# Patient Record
Sex: Female | Born: 1960
Health system: Southern US, Community
[De-identification: ages and names within clinical notes are randomized; demographics above are authoritative.]

## PROBLEM LIST (undated history)

## (undated) DIAGNOSIS — K219 Gastro-esophageal reflux disease without esophagitis: Secondary | ICD-10-CM

## (undated) DIAGNOSIS — F419 Anxiety disorder, unspecified: Secondary | ICD-10-CM

## (undated) DIAGNOSIS — F32A Depression, unspecified: Secondary | ICD-10-CM

## (undated) DIAGNOSIS — G8929 Other chronic pain: Secondary | ICD-10-CM

## (undated) DIAGNOSIS — T7840XA Allergy, unspecified, initial encounter: Secondary | ICD-10-CM

## (undated) DIAGNOSIS — M549 Dorsalgia, unspecified: Secondary | ICD-10-CM

## (undated) DIAGNOSIS — M1990 Unspecified osteoarthritis, unspecified site: Secondary | ICD-10-CM

## (undated) DIAGNOSIS — E785 Hyperlipidemia, unspecified: Secondary | ICD-10-CM

## (undated) HISTORY — DX: Gastro-esophageal reflux disease without esophagitis: K21.9

## (undated) HISTORY — DX: Anxiety disorder, unspecified: F41.9

## (undated) HISTORY — PX: BRAIN SURGERY: SHX531

## (undated) HISTORY — DX: Hyperlipidemia, unspecified: E78.5

## (undated) HISTORY — PX: CHOLECYSTECTOMY: SHX55

## (undated) HISTORY — DX: Allergy, unspecified, initial encounter: T78.40XA

---

## 2000-04-11 ENCOUNTER — Observation Stay (HOSPITAL_COMMUNITY): Admission: RE | Admit: 2000-04-11 | Discharge: 2000-04-11 | Payer: Self-pay | Admitting: General Surgery

## 2000-11-14 ENCOUNTER — Other Ambulatory Visit: Admission: RE | Admit: 2000-11-14 | Discharge: 2000-11-14 | Payer: Self-pay | Admitting: Family Medicine

## 2000-12-06 ENCOUNTER — Encounter: Payer: Self-pay | Admitting: Family Medicine

## 2000-12-06 ENCOUNTER — Encounter: Admission: RE | Admit: 2000-12-06 | Discharge: 2000-12-06 | Payer: Self-pay | Admitting: Family Medicine

## 2011-01-16 ENCOUNTER — Ambulatory Visit: Payer: Self-pay | Admitting: Physical Therapy

## 2012-06-18 ENCOUNTER — Other Ambulatory Visit: Payer: Self-pay

## 2012-06-23 ENCOUNTER — Telehealth: Payer: Self-pay | Admitting: Nurse Practitioner

## 2012-06-25 ENCOUNTER — Telehealth: Payer: Self-pay | Admitting: *Deleted

## 2012-06-25 MED ORDER — ATORVASTATIN CALCIUM 40 MG PO TABS: 40.0000 mg | ORAL_TABLET | Freq: Every day | ORAL | Status: DC

## 2012-06-25 NOTE — Telephone Encounter (Signed)
RX for lipitor sent to [facility]

## 2012-06-25 NOTE — Telephone Encounter (Signed)
[facility] [employer] Denied coverage of Crestor until pt has tried a generic HMG or HMG combination and had inadequate treatment response or adverse side effects .  Can you please consider trying one of these medications. Thank you.

## 2012-06-25 NOTE — Telephone Encounter (Signed)
After taking 31 days of generic medication crestor can be re submitted and it will be covered without a prior authorization.

## 2012-06-25 NOTE — Telephone Encounter (Signed)
Stacie Solis sent prior auth request on 06-20-12, still pending, will have her recheck on this today. Meanwhile, I gave pt 2 wks of samples.

## 2012-07-03 ENCOUNTER — Other Ambulatory Visit: Payer: Self-pay | Admitting: Nurse Practitioner

## 2012-07-03 NOTE — Telephone Encounter (Signed)
Last seen 01/15/12   On med list patient is not on fenofibrate  On Trilipix 145  Will send chart to you

## 2012-09-05 ENCOUNTER — Other Ambulatory Visit: Payer: Self-pay | Admitting: Family Medicine

## 2012-09-10 ENCOUNTER — Telehealth: Payer: Self-pay | Admitting: Nurse Practitioner

## 2012-09-10 NOTE — Telephone Encounter (Signed)
APPT MADE

## 2012-09-11 ENCOUNTER — Ambulatory Visit (INDEPENDENT_AMBULATORY_CARE_PROVIDER_SITE_OTHER): Payer: 59 | Admitting: Nurse Practitioner

## 2012-09-11 ENCOUNTER — Encounter: Payer: Self-pay | Admitting: Nurse Practitioner

## 2012-09-11 VITALS — BP 137/81 | HR 68 | Temp 97.1°F | Ht 62.0 in | Wt 180.0 lb

## 2012-09-11 DIAGNOSIS — K219 Gastro-esophageal reflux disease without esophagitis: Secondary | ICD-10-CM | POA: Insufficient documentation

## 2012-09-11 DIAGNOSIS — G500 Trigeminal neuralgia: Secondary | ICD-10-CM | POA: Insufficient documentation

## 2012-09-11 DIAGNOSIS — M5430 Sciatica, unspecified side: Secondary | ICD-10-CM

## 2012-09-11 DIAGNOSIS — E785 Hyperlipidemia, unspecified: Secondary | ICD-10-CM | POA: Insufficient documentation

## 2012-09-11 DIAGNOSIS — M5431 Sciatica, right side: Secondary | ICD-10-CM

## 2012-09-11 DIAGNOSIS — F410 Panic disorder [episodic paroxysmal anxiety] without agoraphobia: Secondary | ICD-10-CM | POA: Insufficient documentation

## 2012-09-11 MED ORDER — METHYLPREDNISOLONE ACETATE 80 MG/ML IJ SUSP
80.0000 mg | Freq: Once | INTRAMUSCULAR | Status: AC
  Administered 2012-09-11: 80 mg via INTRAMUSCULAR

## 2012-09-11 MED ORDER — TRAMADOL HCL 50 MG PO TABS: 50.0000 mg | ORAL_TABLET | Freq: Three times a day (TID) | ORAL | Status: DC | PRN

## 2012-09-11 NOTE — Patient Instructions (Addendum)
Sciatica  Sciatica is a condition often seen in patients with disk disease of the lower back. Pressure on the sciatica nerve causes pain to radiate from the lower back or buttock down the leg.   CAUSES   It results from pressure on nerve roots coming out of the spine. This is often the result of a disc that deteriorates and pushes to one side. Often there is a history of back problems.   TREATMENT   In most cases sciatica improves greatly with conservative treatment (treatment that does not involve surgery). Most patients are completely better after 2-4 weeks of bed rest and other supportive care. Bed rest reduces the disc pressure greatly. Sitting is the worst position. When sitting pressure on the disc is over 5 times greater than it is while lying down. Avoid:   Bending.   Lifting.   All other activities which make the problem worse.  After the pain improves, you may continue with normal activity. Take brief periods for bed rest throughout the day until you are back to normal.  Only take over-the-counter or prescription medicines for pain, discomfort, or fever as directed by your caregiver. Muscle relaxants may help by relieving spasm and providing mild sedation. Cold or heat therapy and massage may also give significant relief. Spinal manipulation is not recommended because it can increase the degree of disc protrusion. Traction can be used in severe cases. Surgery is reserved for patients who:   Do not improve within the first months of conservative treatment.   Have signs of severe nerve root pressure.  See your doctor for follow up care as recommended. A program for back injury rehabilitation with stretching and strengthening exercises is an important part of healing.   SEEK MEDICAL CARE IF:    You notice increased pain.   You notice weakness.   You have numbness in your legs.   You have any difficulty with bladder or bowel control.  Document Released: 03/22/2004 Document Revised: 02/01/2011 Document  Reviewed: 02/12/2005  Exit[employer] Patient Information 2012 [employer].

## 2012-09-11 NOTE — Progress Notes (Signed)
  Subjective:    Patient ID: Stacie Solis, female    DOB: 02/22/1961, 52 y.o.   MRN: [id number]  HPI Pt here for for lower back pain that radiates into her legs. States this has been going on for about a year, but last month has gotten much worse. Pain is intermittent and sharp aching 9 out 10 when the pain occurs. Sitting and driving makes it worse, and laying down helps pain. Pt states she has taken Motrin and Tylenol with mild relief.     Review of Systems  Musculoskeletal: Positive for back pain.  All other systems reviewed and are negative.       Objective:   Physical Exam  Constitutional: She is oriented to person, place, and time. She appears well-developed and well-nourished.  Cardiovascular: Normal rate, regular rhythm, normal heart sounds and intact distal pulses.   Pulmonary/Chest: Effort normal.  Musculoskeletal: Normal range of motion. She exhibits tenderness.  Pain to touch in lower left gluteal area radiating down right leg at least down to knee  Neurological: She is alert and oriented to person, place, and time.  Skin: Skin is warm and dry.  Psychiatric: She has a normal mood and affect. Her behavior is normal. Judgment and thought content normal.      BP 137/81  Pulse 68  Temp(Src) 97.1 F (36.2 C) (Oral)  Ht 5\' 2"  (1.575 m)  Wt 180 lb (81.647 kg)  BMI 32.91 kg/m2     Assessment & Plan:  1. Sciatica neuralgia, right Moist heat to buttocks Rest No bending or stooping RTO prn - methylPREDNISolone acetate (DEPO-MEDROL) injection 80 mg; Inject 1 mL (80 mg total) into the muscle once. - traMADol (ULTRAM) 50 MG tablet; Take 1 tablet (50 mg total) by mouth every 8 (eight) hours as needed for pain.  Dispense: 30 tablet; Refill: 0  Mary-Margaret Daphine Deutscher, FNP

## 2012-09-17 ENCOUNTER — Other Ambulatory Visit: Payer: Self-pay | Admitting: Nurse Practitioner

## 2012-09-23 ENCOUNTER — Telehealth: Payer: Self-pay | Admitting: Nurse Practitioner

## 2012-09-24 ENCOUNTER — Telehealth: Payer: Self-pay | Admitting: Nurse Practitioner

## 2012-09-24 NOTE — Telephone Encounter (Signed)
NTBS.

## 2012-09-25 ENCOUNTER — Telehealth: Payer: Self-pay | Admitting: Nurse Practitioner

## 2012-09-25 MED ORDER — PREDNISONE (PAK) 10 MG PO TABS: ORAL_TABLET | ORAL | Status: DC

## 2012-09-25 NOTE — Telephone Encounter (Signed)
Rx for medrol dose pak sent to pharmacy. Pt notified

## 2012-09-25 NOTE — Telephone Encounter (Signed)
NTBS.

## 2012-09-25 NOTE — Telephone Encounter (Signed)
Husband notified that rx sent to pharmacy

## 2012-09-25 NOTE — Telephone Encounter (Signed)
Here 7/17 and gave a shot of cortisone- back and hip dont hurt. Only right leg pain. Has tramadol on hand- not helping. Is there something different or stronger that [facility]

## 2012-09-25 NOTE — Telephone Encounter (Signed)
prenisone dose pack rx sent to pharmacy

## 2012-10-05 ENCOUNTER — Other Ambulatory Visit: Payer: Self-pay | Admitting: Nurse Practitioner

## 2012-12-17 ENCOUNTER — Other Ambulatory Visit: Payer: Self-pay | Admitting: Nurse Practitioner

## 2012-12-18 NOTE — Telephone Encounter (Signed)
No refill on lipitor- NTBS 

## 2012-12-18 NOTE — Telephone Encounter (Signed)
No labs or appts since 11/13

## 2012-12-22 ENCOUNTER — Other Ambulatory Visit: Payer: Self-pay | Admitting: Nurse Practitioner

## 2012-12-24 ENCOUNTER — Telehealth: Payer: Self-pay | Admitting: Nurse Practitioner

## 2012-12-24 NOTE — Telephone Encounter (Signed)
LAST OV 09/11/12.

## 2012-12-24 NOTE — Telephone Encounter (Signed)
Appt given per pt request 

## 2013-01-03 ENCOUNTER — Other Ambulatory Visit: Payer: Self-pay | Admitting: Nurse Practitioner

## 2013-01-05 ENCOUNTER — Ambulatory Visit (INDEPENDENT_AMBULATORY_CARE_PROVIDER_SITE_OTHER): Payer: 59 | Admitting: Nurse Practitioner

## 2013-01-05 ENCOUNTER — Encounter: Payer: Self-pay | Admitting: Nurse Practitioner

## 2013-01-05 VITALS — BP 129/80 | HR 73 | Temp 97.9°F | Ht 62.0 in | Wt 185.0 lb

## 2013-01-05 DIAGNOSIS — K219 Gastro-esophageal reflux disease without esophagitis: Secondary | ICD-10-CM

## 2013-01-05 DIAGNOSIS — F32A Depression, unspecified: Secondary | ICD-10-CM | POA: Insufficient documentation

## 2013-01-05 DIAGNOSIS — F410 Panic disorder [episodic paroxysmal anxiety] without agoraphobia: Secondary | ICD-10-CM

## 2013-01-05 DIAGNOSIS — F329 Major depressive disorder, single episode, unspecified: Secondary | ICD-10-CM | POA: Insufficient documentation

## 2013-01-05 DIAGNOSIS — E785 Hyperlipidemia, unspecified: Secondary | ICD-10-CM

## 2013-01-05 MED ORDER — FENOFIBRIC ACID 135 MG PO CPDR: 1.0000 | DELAYED_RELEASE_CAPSULE | Freq: Every day | ORAL | Status: DC

## 2013-01-05 MED ORDER — ATORVASTATIN CALCIUM 40 MG PO TABS: 40.0000 mg | ORAL_TABLET | Freq: Every day | ORAL | Status: DC

## 2013-01-05 MED ORDER — OMEPRAZOLE 40 MG PO CPDR: 40.0000 mg | DELAYED_RELEASE_CAPSULE | Freq: Every day | ORAL | Status: DC

## 2013-01-05 MED ORDER — ESCITALOPRAM OXALATE 10 MG PO TABS: 10.0000 mg | ORAL_TABLET | Freq: Every day | ORAL | Status: DC

## 2013-01-05 MED ORDER — TRAZODONE HCL 50 MG PO TABS: 25.0000 mg | ORAL_TABLET | Freq: Every evening | ORAL | Status: DC | PRN

## 2013-01-05 NOTE — Progress Notes (Signed)
Subjective:    Patient ID: Stacie Solis, female    DOB: [birth year], 52 y.o.   MRN: [id number]  Patien tin today  for chronic medical problems: Hyperlipidemia This is a chronic problem. The current episode started more than 1 year ago. The problem is uncontrolled. Recent lipid tests were reviewed and are high. Exacerbating diseases include obesity. She has no history of diabetes, hypothyroidism or liver disease. There are no known factors aggravating her hyperlipidemia. Pertinent negatives include no chest pain, focal sensory loss, focal weakness, leg pain or myalgias. Current antihyperlipidemic treatment includes statins and fibric acid derivatives. The current treatment provides mild improvement of lipids. Compliance problems include adherence to diet and adherence to exercise.   Depression/insomnia Works well for her depression and anxiety- but doesn't help with sleep- says that body won't wind down to rest. GERD Omeprazole is working well for symptoms.  Review of Systems  Cardiovascular: Negative for chest pain.  Musculoskeletal: Negative for myalgias.  Neurological: Negative for focal weakness.  All other systems reviewed and are negative.       Objective:   Physical Exam  Constitutional: She is oriented to person, place, and time. She appears well-developed and well-nourished.  HENT:  Nose: Nose normal.  Mouth/Throat: Oropharynx is clear and moist.  Eyes: EOM are normal.  Neck: Trachea normal, normal range of motion and full passive range of motion without pain. Neck supple. No JVD present. Carotid bruit is not present. No thyromegaly present.  Cardiovascular: Normal rate, regular rhythm, normal heart sounds and intact distal pulses.  Exam reveals no gallop and no friction rub.   No murmur heard. Pulmonary/Chest: Effort normal and breath sounds normal.  Abdominal: Soft. Bowel sounds are normal. She exhibits no distension and no mass. There is no tenderness.  Musculoskeletal:  Normal range of motion.  Lymphadenopathy:    She has no cervical adenopathy.  Neurological: She is alert and oriented to person, place, and time. She has normal reflexes.  Skin: Skin is warm and dry.  Psychiatric: She has a normal mood and affect. Her behavior is normal. Judgment and thought content normal.    BP 129/80  Pulse 73  Temp(Src) 97.9 F (36.6 C) (Oral)  Ht 5\' 2"  (1.575 m)  Wt 185 lb (83.915 kg)  BMI 33.83 kg/m2       Assessment & Plan:   1. Depression   2. Panic attacks   3. Hyperlipidemia   4. GERD (gastroesophageal reflux disease)    Orders Placed This Encounter  Procedures  . CMP14+EGFR  . NMR, lipoprofile   Meds ordered this encounter  Medications  . triamcinolone (NASACORT) 55 MCG/ACT nasal inhaler    Sig:   . DISCONTD: HYDROcodone-acetaminophen (NORCO/VICODIN) 5-325 MG per tablet    Sig:   . traZODone (DESYREL) 50 MG tablet    Sig: Take 0.5-1 tablets (25-50 mg total) by mouth at bedtime as needed for sleep.    Dispense:  30 tablet    Refill:  3    Order Specific Question:  Supervising Provider    Answer:  Ernestina Penna [[id number]]  . omeprazole (PRILOSEC) 40 MG capsule    Sig: Take 1 capsule (40 mg total) by mouth daily.    Dispense:  90 capsule    Refill:  1    Order Specific Question:  Supervising Provider    Answer:  Ernestina Penna [[id number]]  . escitalopram (LEXAPRO) 10 MG tablet    Sig: Take 1 tablet (10 mg total) by mouth  daily.    Dispense:  90 tablet    Refill:  1    Order Specific Question:  Supervising Provider    Answer:  Ernestina Penna [[id number]]  . Choline Fenofibrate (FENOFIBRIC ACID) 135 MG CPDR    Sig: Take 1 tablet by mouth daily.    Dispense:  90 capsule    Refill:  1    Order Specific Question:  Supervising Provider    Answer:  Ernestina Penna [[id number]]  . atorvastatin (LIPITOR) 40 MG tablet    Sig: Take 1 tablet (40 mg total) by mouth daily.    Dispense:  90 tablet    Refill:  1    Order Specific Question:  Supervising  Provider    Answer:  Ernestina Penna [[id number]]  ADDed trazadone for sleep Patient to schedule Continue all meds Labs pending Diet and exercise encouraged Health maintenance reviewed Follow up in 3 months  Mary-Margaret Daphine Deutscher, FNP

## 2013-01-05 NOTE — Patient Instructions (Signed)
Fat and Cholesterol Control Diet  Fat and cholesterol levels in your blood and organs are influenced by your diet. High levels of fat and cholesterol may lead to diseases of the heart, small and large blood vessels, gallbladder, liver, and pancreas.  CONTROLLING FAT AND CHOLESTEROL WITH DIET  Although exercise and lifestyle factors are important, your diet is key. That is because certain foods are known to raise cholesterol and others to lower it. The goal is to balance foods for their effect on cholesterol and more importantly, to replace saturated and trans fat with other types of fat, such as monounsaturated fat, polyunsaturated fat, and omega-3 fatty acids.  On average, a person should consume no more than 15 to 17 g of saturated fat daily. Saturated and trans fats are considered "bad" fats, and they will raise LDL cholesterol. Saturated fats are primarily found in animal products such as meats, butter, and cream. However, that does not mean you need to give up all your favorite foods. Today, there are good tasting, low-fat, low-cholesterol substitutes for most of the things you like to eat. Choose low-fat or nonfat alternatives. Choose round or loin cuts of red meat. These types of cuts are lowest in fat and cholesterol. Chicken (without the skin), fish, veal, and ground turkey breast are great choices. Eliminate fatty meats, such as hot dogs and salami. Even shellfish have little or no saturated fat. Have a 3 oz (85 g) portion when you eat lean meat, poultry, or fish.  Trans fats are also called "partially hydrogenated oils." They are oils that have been scientifically manipulated so that they are solid at room temperature resulting in a longer shelf life and improved taste and texture of foods in which they are added. Trans fats are found in stick margarine, some tub margarines, cookies, crackers, and baked goods.   When baking and cooking, oils are a great substitute for butter. The monounsaturated oils are  especially beneficial since it is believed they lower LDL and raise HDL. The oils you should avoid entirely are saturated tropical oils, such as coconut and palm.   Remember to eat a lot from food groups that are naturally free of saturated and trans fat, including fish, fruit, vegetables, beans, grains (barley, rice, couscous, bulgur wheat), and pasta (without cream sauces).   IDENTIFYING FOODS THAT LOWER FAT AND CHOLESTEROL   Soluble fiber may lower your cholesterol. This type of fiber is found in fruits such as apples, vegetables such as broccoli, potatoes, and carrots, legumes such as beans, peas, and lentils, and grains such as barley. Foods fortified with plant sterols (phytosterol) may also lower cholesterol. You should eat at least 2 g per day of these foods for a cholesterol lowering effect.   Read package labels to identify low-saturated fats, trans fat free, and low-fat foods at the supermarket. Select cheeses that have only 2 to 3 g saturated fat per ounce. Use a heart-healthy tub margarine that is free of trans fats or partially hydrogenated oil. When buying baked goods (cookies, crackers), avoid partially hydrogenated oils. Breads and muffins should be made from whole grains (whole-wheat or whole oat flour, instead of "flour" or "enriched flour"). Buy non-creamy canned soups with reduced salt and no added fats.   FOOD PREPARATION TECHNIQUES   Never deep-fry. If you must fry, either stir-fry, which uses very little fat, or use non-stick cooking sprays. When possible, broil, bake, or roast meats, and steam vegetables. Instead of putting butter or margarine on vegetables, use lemon   and herbs, applesauce, and cinnamon (for squash and sweet potatoes). Use nonfat yogurt, salsa, and low-fat dressings for salads.   LOW-SATURATED FAT / LOW-FAT FOOD SUBSTITUTES  Meats / Saturated Fat (g)  · Avoid: Steak, marbled (3 oz/85 g) / 11 g  · Choose: Steak, lean (3 oz/85 g) / 4 g  · Avoid: Hamburger (3 oz/85 g) / 7  g  · Choose: Hamburger, lean (3 oz/85 g) / 5 g  · Avoid: Ham (3 oz/85 g) / 6 g  · Choose: Ham, lean cut (3 oz/85 g) / 2.4 g  · Avoid: Chicken, with skin, dark meat (3 oz/85 g) / 4 g  · Choose: Chicken, skin removed, dark meat (3 oz/85 g) / 2 g  · Avoid: Chicken, with skin, light meat (3 oz/85 g) / 2.5 g  · Choose: Chicken, skin removed, light meat (3 oz/85 g) / 1 g  Dairy / Saturated Fat (g)  · Avoid: Whole milk (1 cup) / 5 g  · Choose: Low-fat milk, 2% (1 cup) / 3 g  · Choose: Low-fat milk, 1% (1 cup) / 1.5 g  · Choose: Skim milk (1 cup) / 0.3 g  · Avoid: Hard cheese (1 oz/28 g) / 6 g  · Choose: Skim milk cheese (1 oz/28 g) / 2 to 3 g  · Avoid: Cottage cheese, 4% fat (1 cup) / 6.5 g  · Choose: Low-fat cottage cheese, 1% fat (1 cup) / 1.5 g  · Avoid: Ice cream (1 cup) / 9 g  · Choose: Sherbet (1 cup) / 2.5 g  · Choose: Nonfat frozen yogurt (1 cup) / 0.3 g  · Choose: Frozen fruit bar / trace  · Avoid: Whipped cream (1 tbs) / 3.5 g  · Choose: Nondairy whipped topping (1 tbs) / 1 g  Condiments / Saturated Fat (g)  · Avoid: Mayonnaise (1 tbs) / 2 g  · Choose: Low-fat mayonnaise (1 tbs) / 1 g  · Avoid: Butter (1 tbs) / 7 g  · Choose: Extra light margarine (1 tbs) / 1 g  · Avoid: Coconut oil (1 tbs) / 11.8 g  · Choose: Olive oil (1 tbs) / 1.8 g  · Choose: Corn oil (1 tbs) / 1.7 g  · Choose: Safflower oil (1 tbs) / 1.2 g  · Choose: Sunflower oil (1 tbs) / 1.4 g  · Choose: Soybean oil (1 tbs) / 2.4 g  · Choose: Canola oil (1 tbs) / 1 g  Document Released: 02/12/2005 Document Revised: 06/09/2012 Document Reviewed: 08/03/2010  [employer]® Patient Information ©2014 [employer].

## 2013-01-06 LAB — LIPID PANEL
Chol/HDL Ratio: 3.7 ratio units (ref 0.0–4.4)
Cholesterol, Total: 108 mg/dL (ref 100–199)
HDL: 29 mg/dL — ABNORMAL LOW (ref >39)
VLDL Cholesterol Cal: 26 mg/dL (ref 5–40)

## 2013-01-06 LAB — CMP14+EGFR
ALT: 32 IU/L (ref 0–32)
AST: 26 IU/L (ref 0–40)
Albumin/Globulin Ratio: 2.3 (ref 1.1–2.5)
Albumin: 4.3 g/dL (ref 3.5–5.5)
BUN/Creatinine Ratio: 12 (ref 9–23)
BUN: 11 mg/dL (ref 6–24)
Calcium: 9.2 mg/dL (ref 8.7–10.2)
Chloride: 103 mmol/L (ref 97–108)
GFR calc non Af Amer: 69 mL/min/{1.73_m2} (ref >59)
Globulin, Total: 1.9 g/dL (ref 1.5–4.5)
Glucose: 105 mg/dL — ABNORMAL HIGH (ref 65–99)
Potassium: 4.5 mmol/L (ref 3.5–5.2)
Sodium: 141 mmol/L (ref 134–144)
Total Bilirubin: 0.3 mg/dL (ref 0.0–1.2)
Total Protein: 6.2 g/dL (ref 6.0–8.5)

## 2013-01-13 ENCOUNTER — Encounter: Payer: Self-pay | Admitting: *Deleted

## 2013-01-13 NOTE — Progress Notes (Signed)
Quick Note:  Copy of labs sent to patient ______ 

## 2013-03-02 ENCOUNTER — Other Ambulatory Visit: Payer: Self-pay | Admitting: Family Medicine

## 2013-03-24 ENCOUNTER — Other Ambulatory Visit: Payer: Self-pay

## 2013-03-24 MED ORDER — TRIAMCINOLONE ACETONIDE 55 MCG/ACT NA AERO: 2.0000 | INHALATION_SPRAY | Freq: Every day | NASAL | Status: DC

## 2013-03-27 ENCOUNTER — Other Ambulatory Visit: Payer: Self-pay | Admitting: *Deleted

## 2013-03-27 MED ORDER — TRAZODONE HCL 50 MG PO TABS: 25.0000 mg | ORAL_TABLET | Freq: Every evening | ORAL | Status: DC | PRN

## 2013-03-27 NOTE — Telephone Encounter (Signed)
Patient requesting a 90 day supply. Please advise.

## 2013-03-30 ENCOUNTER — Other Ambulatory Visit: Payer: Self-pay | Admitting: Nurse Practitioner

## 2013-03-31 NOTE — Telephone Encounter (Signed)
Last seen 01/05/13 MMM 

## 2013-04-08 ENCOUNTER — Ambulatory Visit: Payer: 59 | Admitting: Nurse Practitioner

## 2013-07-03 ENCOUNTER — Other Ambulatory Visit: Payer: Self-pay

## 2013-07-03 MED ORDER — ATORVASTATIN CALCIUM 40 MG PO TABS: 40.0000 mg | ORAL_TABLET | Freq: Every day | ORAL | Status: DC

## 2013-07-03 MED ORDER — FENOFIBRIC ACID 135 MG PO CPDR: 1.0000 | DELAYED_RELEASE_CAPSULE | Freq: Every day | ORAL | Status: DC

## 2013-07-03 NOTE — Telephone Encounter (Signed)
Last seen 01/05/13 MMM  Last lipid 01/05/13  Requesting 90 day supply

## 2013-07-03 NOTE — Telephone Encounter (Signed)
Patient NTBS for follow up and lab work  

## 2013-08-27 ENCOUNTER — Other Ambulatory Visit: Payer: Self-pay | Admitting: Nurse Practitioner

## 2013-08-31 NOTE — Telephone Encounter (Signed)
Last seen 01/05/13 MMM

## 2013-09-30 ENCOUNTER — Other Ambulatory Visit: Payer: Self-pay | Admitting: Nurse Practitioner

## 2013-10-01 NOTE — Telephone Encounter (Signed)
no more refills without being seen  

## 2013-10-01 NOTE — Telephone Encounter (Signed)
Last seen 12/2012 

## 2013-10-02 ENCOUNTER — Other Ambulatory Visit: Payer: Self-pay | Admitting: Nurse Practitioner

## 2013-10-19 ENCOUNTER — Ambulatory Visit (INDEPENDENT_AMBULATORY_CARE_PROVIDER_SITE_OTHER): Payer: 59 | Admitting: Family Medicine

## 2013-10-19 ENCOUNTER — Encounter: Payer: Self-pay | Admitting: Family Medicine

## 2013-10-19 VITALS — BP 156/79 | HR 66 | Temp 98.7°F | Ht 62.0 in | Wt 192.0 lb

## 2013-10-19 DIAGNOSIS — J0100 Acute maxillary sinusitis, unspecified: Secondary | ICD-10-CM

## 2013-10-19 MED ORDER — FLUCONAZOLE 150 MG PO TABS: 150.0000 mg | ORAL_TABLET | Freq: Once | ORAL | Status: DC

## 2013-10-19 MED ORDER — IPRATROPIUM BROMIDE 0.06 % NA SOLN: 2.0000 | Freq: Four times a day (QID) | NASAL | Status: DC

## 2013-10-19 MED ORDER — DICLOFENAC SODIUM 50 MG PO TBEC: 50.0000 mg | DELAYED_RELEASE_TABLET | Freq: Two times a day (BID) | ORAL | Status: DC

## 2013-10-19 MED ORDER — AMOXICILLIN-POT CLAVULANATE 875-125 MG PO TABS: 1.0000 | ORAL_TABLET | Freq: Two times a day (BID) | ORAL | Status: DC

## 2013-10-19 NOTE — Patient Instructions (Signed)
You have a sinus infection Please start the augmentin Please use the diflucan if you get a yeast infection Use the voltaren for pain and headache Please try the atrovent nasal spray fo rrelief of the drainage down the back of the throat  Sinusitis Sinusitis is redness, soreness, and inflammation of the paranasal sinuses. Paranasal sinuses are air pockets within the bones of your face (beneath the eyes, the middle of the forehead, or above the eyes). In healthy paranasal sinuses, mucus is able to drain out, and air is able to circulate through them by way of your nose. However, when your paranasal sinuses are inflamed, mucus and air can become trapped. This can allow bacteria and other germs to grow and cause infection. Sinusitis can develop quickly and last only a short time (acute) or continue over a long period (chronic). Sinusitis that lasts for more than 12 weeks is considered chronic.  CAUSES  Causes of sinusitis include:  Allergies.  Structural abnormalities, such as displacement of the cartilage that separates your nostrils (deviated septum), which can decrease the air flow through your nose and sinuses and affect sinus drainage.  Functional abnormalities, such as when the small hairs (cilia) that line your sinuses and help remove mucus do not work properly or are not present. SIGNS AND SYMPTOMS  Symptoms of acute and chronic sinusitis are the same. The primary symptoms are pain and pressure around the affected sinuses. Other symptoms include:  Upper toothache.  Earache.  Headache.  Bad breath.  Decreased sense of smell and taste.  A cough, which worsens when you are lying flat.  Fatigue.  Fever.  Thick drainage from your nose, which often is green and may contain pus (purulent).  Swelling and warmth over the affected sinuses. DIAGNOSIS  Your health care provider will perform a physical exam. During the exam, your health care provider may:  Look in your nose for signs  of abnormal growths in your nostrils (nasal polyps).  Tap over the affected sinus to check for signs of infection.  View the inside of your sinuses (endoscopy) using an imaging device that has a light attached (endoscope). If your health care provider suspects that you have chronic sinusitis, one or more of the following tests may be recommended:  Allergy tests.  Nasal culture. A sample of mucus is taken from your nose, sent to a lab, and screened for bacteria.  Nasal cytology. A sample of mucus is taken from your nose and examined by your health care provider to determine if your sinusitis is related to an allergy. TREATMENT  Most cases of acute sinusitis are related to a viral infection and will resolve on their own within 10 days. Sometimes medicines are prescribed to help relieve symptoms (pain medicine, decongestants, nasal steroid sprays, or saline sprays).  However, for sinusitis related to a bacterial infection, your health care provider will prescribe antibiotic medicines. These are medicines that will help kill the bacteria causing the infection.  Rarely, sinusitis is caused by a fungal infection. In theses cases, your health care provider will prescribe antifungal medicine. For some cases of chronic sinusitis, surgery is needed. Generally, these are cases in which sinusitis recurs more than 3 times per year, despite other treatments. HOME CARE INSTRUCTIONS   Drink plenty of water. Water helps thin the mucus so your sinuses can drain more easily.  Use a humidifier.  Inhale steam 3 to 4 times a day (for example, sit in the bathroom with the shower running).  Apply a  warm, moist washcloth to your face 3 to 4 times a day, or as directed by your health care provider.  Use saline nasal sprays to help moisten and clean your sinuses.  Take medicines only as directed by your health care provider.  If you were prescribed either an antibiotic or antifungal medicine, finish it all even  if you start to feel better. SEEK IMMEDIATE MEDICAL CARE IF:  You have increasing pain or severe headaches.  You have nausea, vomiting, or drowsiness.  You have swelling around your face.  You have vision problems.  You have a stiff neck.  You have difficulty breathing. MAKE SURE YOU:   Understand these instructions.  Will watch your condition.  Will get help right away if you are not doing well or get worse. Document Released: 02/12/2005 Document Revised: 06/29/2013 Document Reviewed: 02/27/2011 [facility] Patient Information 2015 [place], [place]. This information is not intended to replace advice given to you by your health care provider. Make sure you discuss any questions you have with your health care provider.

## 2013-10-19 NOTE — Progress Notes (Signed)
Stacie Solis is a 53 y.o. female who presents to [facility] today for sinus infection  Started w/ sinus congestion 3 wks ago. Now w/ ear pressure on R. Lost voice. Denies SOB, rash. Associated w/ HA and fever and chills, post nasal drip. Continues to smoke 2ppd. Sudafed, and other OTC sinus meds w/o much benefit. Nasocort nightly.   Ibuprofen 1000mg  w/ some benefit.   The following portions of the patient's history were reviewed and updated as appropriate: allergies, current medications, past medical history, family and social history, and problem list.  Patient is a smoker   Past Medical History  Diagnosis Date  . GERD (gastroesophageal reflux disease)   . Anxiety   . Allergy   . Hyperlipidemia     ROS as above otherwise neg.    Medications reviewed. Current Outpatient Prescriptions  Medication Sig Dispense Refill  . atorvastatin (LIPITOR) 40 MG tablet TAKE 1 TABLET (40 MG TOTAL) BY MOUTH DAILY.  90 tablet  0  . cholecalciferol (VITAMIN D) 1000 UNITS tablet Take 1,000 Units by mouth daily.      . Choline Fenofibrate (FENOFIBRIC ACID) 135 MG CPDR TAKE ONE CAPSULE BY MOUTH EVERY DAY  90 capsule  0  . escitalopram (LEXAPRO) 10 MG tablet TAKE 1 TABLET (10 MG TOTAL) BY MOUTH DAILY.  90 tablet  1  . fish oil-omega-3 fatty acids 1000 MG capsule Take 2 g by mouth 2 (two) times daily.      Marland Kitchen omeprazole (PRILOSEC) 40 MG capsule TAKE 1 CAPSULE (40 MG TOTAL) BY MOUTH DAILY.  90 capsule  0  . traZODone (DESYREL) 50 MG tablet TAKE 0.5-1 TABLETS (25-50 MG TOTAL) BY MOUTH AT BEDTIME AS NEEDED FOR SLEEP.  90 tablet  0  . triamcinolone (NASACORT) 55 MCG/ACT AERO nasal inhaler Place 2 sprays into the nose daily.  3 Bottle  0  . amoxicillin-clavulanate (AUGMENTIN) 875-125 MG per tablet Take 1 tablet by mouth 2 (two) times daily.  20 tablet  0  . diclofenac (VOLTAREN) 50 MG EC tablet Take 1 tablet (50 mg total) by mouth 2 (two) times daily.  20 tablet  0  . fluconazole (DIFLUCAN) 150 MG tablet Take 1 tablet  (150 mg total) by mouth once. Repeat dose in 2 days  2 tablet  0  . ipratropium (ATROVENT) 0.06 % nasal spray Place 2 sprays into both nostrils 4 (four) times daily.  15 mL  12   No current facility-administered medications for this visit.    Exam:  BP 156/79  Pulse 66  Temp(Src) 98.7 F (37.1 C) (Oral)  Ht 5\' 2"  (1.575 m)  Wt 192 lb (87.091 kg)  BMI 35.11 kg/m2 Gen: Well NAD HEENT: EOMI,  MMM, pharyngeal injection, frontal and maxillary sinuses ttp.  Lungs: CTABL Nl WOB Heart: RRR no MRG Abd: NABS, NT, ND    No results found for this or any previous visit (from the past 72 hour(s)).  A/P (as seen in Problem list)  Sinusitis 3 wks of progressive sinusitis Start augmentin Nasal atrovent for post nasal drip relief.  Counseled to quit smoking Voltaren prn pain and HA Diflucan (pt endorses regular yeast infections post ABX)

## 2013-10-19 NOTE — Assessment & Plan Note (Signed)
3 wks of progressive sinusitis Start augmentin Nasal atrovent for post nasal drip relief.  Counseled to quit smoking Voltaren prn pain and HA Diflucan (pt endorses regular yeast infections post ABX)

## 2013-11-17 ENCOUNTER — Telehealth: Payer: Self-pay

## 2013-11-17 NOTE — Telephone Encounter (Signed)
Pt scheduled for 6 month f/u mammo; Pt aware of appointment date/time  11/24/13 at 10:30am

## 2013-11-17 NOTE — Telephone Encounter (Signed)
Message copied by Koren Bound on Tue Nov 17, 2013  8:49 AM ------      Message from: Hinda Lenis      Created: Fri May 22, 2013  8:12 AM       Needs 6 month f/u rt diagnostic mammo @ [facility] ------

## 2013-11-23 ENCOUNTER — Other Ambulatory Visit: Payer: Self-pay | Admitting: Nurse Practitioner

## 2013-12-09 ENCOUNTER — Ambulatory Visit (INDEPENDENT_AMBULATORY_CARE_PROVIDER_SITE_OTHER): Payer: 59 | Admitting: Family

## 2013-12-09 ENCOUNTER — Encounter: Payer: Self-pay | Admitting: Family

## 2013-12-09 ENCOUNTER — Telehealth: Payer: Self-pay | Admitting: Nurse Practitioner

## 2013-12-09 VITALS — BP 114/81 | HR 78 | Temp 99.2°F | Ht 62.0 in | Wt 191.8 lb

## 2013-12-09 DIAGNOSIS — M545 Low back pain: Secondary | ICD-10-CM

## 2013-12-09 DIAGNOSIS — M5441 Lumbago with sciatica, right side: Secondary | ICD-10-CM

## 2013-12-09 LAB — POCT URINALYSIS DIPSTICK
BILIRUBIN UA: NEGATIVE
Glucose, UA: NEGATIVE
KETONES UA: NEGATIVE
LEUKOCYTES UA: NEGATIVE
Nitrite, UA: NEGATIVE
Protein, UA: NEGATIVE
Spec Grav, UA: <=1.005
Urobilinogen, UA: NEGATIVE
pH, UA: 7.5

## 2013-12-09 LAB — POCT UA - MICROSCOPIC ONLY
Casts, Ur, LPF, POC: NEGATIVE
Crystals, Ur, HPF, POC: NEGATIVE
MUCUS UA: NEGATIVE
Yeast, UA: NEGATIVE

## 2013-12-09 MED ORDER — CYCLOBENZAPRINE HCL 10 MG PO TABS: 10.0000 mg | ORAL_TABLET | Freq: Three times a day (TID) | ORAL | Status: DC | PRN

## 2013-12-09 MED ORDER — MELOXICAM 15 MG PO TABS: 15.0000 mg | ORAL_TABLET | Freq: Every day | ORAL | Status: DC

## 2013-12-09 MED ORDER — KETOROLAC TROMETHAMINE 60 MG/2ML IM SOLN
60.0000 mg | Freq: Once | INTRAMUSCULAR | Status: AC
  Administered 2013-12-09: 60 mg via INTRAMUSCULAR

## 2013-12-09 NOTE — Progress Notes (Signed)
Subjective:    Patient ID: Stacie Solis, female    DOB: Oct 20, 1960, 53 y.o.   MRN: [id number]  Back Pain This is a new problem. The current episode started yesterday. The problem occurs intermittently. The problem has been waxing and waning since onset. The pain is present in the lumbar spine. The quality of the pain is described as shooting. The pain radiates to the right thigh and right knee. The pain is at a severity of 10/10. The pain is moderate. The pain is worse during the night. The symptoms are aggravated by lying down. Associated symptoms include leg pain and weakness. Pertinent negatives include no bladder incontinence, bowel incontinence, dysuria, headaches, numbness or tingling. She has tried NSAIDs for the symptoms. The treatment provided mild relief.      Review of Systems  HENT: Negative.   Eyes: Negative.   Respiratory: Negative.  Negative for shortness of breath.   Cardiovascular: Negative.  Negative for palpitations.  Gastrointestinal: Negative.  Negative for bowel incontinence.  Endocrine: Negative.   Genitourinary: Negative.  Negative for bladder incontinence and dysuria.  Musculoskeletal: Positive for back pain.  Neurological: Positive for weakness. Negative for tingling, numbness and headaches.  Hematological: Negative.   Psychiatric/Behavioral: Negative.   All other systems reviewed and are negative.      Objective:   Physical Exam  Vitals reviewed. Constitutional: She is oriented to person, place, and time. She appears well-developed and well-nourished. No distress.  HENT:  Head: Normocephalic and atraumatic.  Right Ear: External ear normal.  Left Ear: External ear normal.  Mouth/Throat: Oropharynx is clear and moist.  Eyes: Pupils are equal, round, and reactive to light.  Neck: Normal range of motion. Neck supple. No thyromegaly present.  Cardiovascular: Normal rate, regular rhythm, normal heart sounds and intact distal pulses.   No murmur  heard. Pulmonary/Chest: Effort normal and breath sounds normal. No respiratory distress. She has no wheezes.  Abdominal: Soft. Bowel sounds are normal. She exhibits no distension. There is no tenderness.  Musculoskeletal: Normal range of motion. She exhibits no edema and no tenderness.  Decreased ROM of twisting towards right r/t pain  Neurological: She is alert and oriented to person, place, and time. She has normal reflexes. No cranial nerve deficit.  Skin: Skin is warm and dry.  Psychiatric: She has a normal mood and affect. Her behavior is normal. Judgment and thought content normal.      BP 114/81  Pulse 78  Temp(Src) 99.2 F (37.3 C) (Oral)  Ht 5\' 2"  (1.575 m)  Wt 191 lb 12.8 oz (87 kg)  BMI 35.07 kg/m2     Assessment & Plan:  1. Low back pain without sciatica, unspecified back pain laterality - POCT urinalysis dipstick - POCT UA - Microscopic Only  2. Right-sided low back pain with right-sided sciatica -Rest -Ice -Sedation precaution discussed -No other NSAID's while on Mobic  Meds ordered this encounter  Medications  . cyclobenzaprine (FLEXERIL) 10 MG tablet    Sig: Take 1 tablet (10 mg total) by mouth 3 (three) times daily as needed for muscle spasms.    Dispense:  30 tablet    Refill:  0    Order Specific Question:  Supervising Provider    Answer:  Chipper Herb [[id number]]  . meloxicam (MOBIC) 15 MG tablet    Sig: Take 1 tablet (15 mg total) by mouth daily.    Dispense:  30 tablet    Refill:  0    Order Specific Question:  Supervising Provider    Answer:  Chipper Herb [[id number]]      Evelina Dun, FNP

## 2013-12-09 NOTE — Patient Instructions (Signed)
Sciatica Sciatica is pain, weakness, numbness, or tingling along the path of the sciatic nerve. The nerve starts in the lower back and runs down the back of each leg. The nerve controls the muscles in the lower leg and in the back of the knee, while also providing sensation to the back of the thigh, lower leg, and the sole of your foot. Sciatica is a symptom of another medical condition. For instance, nerve damage or certain conditions, such as a herniated disk or bone spur on the spine, pinch or put pressure on the sciatic nerve. This causes the pain, weakness, or other sensations normally associated with sciatica. Generally, sciatica only affects one side of the body. CAUSES   Herniated or slipped disc.  Degenerative disk disease.  A pain disorder involving the narrow muscle in the buttocks (piriformis syndrome).  Pelvic injury or fracture.  Pregnancy.  Tumor (rare). SYMPTOMS  Symptoms can vary from mild to very severe. The symptoms usually travel from the low back to the buttocks and down the back of the leg. Symptoms can include:  Mild tingling or dull aches in the lower back, leg, or hip.  Numbness in the back of the calf or sole of the foot.  Burning sensations in the lower back, leg, or hip.  Sharp pains in the lower back, leg, or hip.  Leg weakness.  Severe back pain inhibiting movement. These symptoms may get worse with coughing, sneezing, laughing, or prolonged sitting or standing. Also, being overweight may worsen symptoms. DIAGNOSIS  Your caregiver will perform a physical exam to look for common symptoms of sciatica. He or she may ask you to do certain movements or activities that would trigger sciatic nerve pain. Other tests may be performed to find the cause of the sciatica. These may include:  Blood tests.  X-rays.  Imaging tests, such as an MRI or CT scan. TREATMENT  Treatment is directed at the cause of the sciatic pain. Sometimes, treatment is not necessary  and the pain and discomfort goes away on its own. If treatment is needed, your caregiver may suggest:  Over-the-counter medicines to relieve pain.  Prescription medicines, such as anti-inflammatory medicine, muscle relaxants, or narcotics.  Applying heat or ice to the painful area.  Steroid injections to lessen pain, irritation, and inflammation around the nerve.  Reducing activity during periods of pain.  Exercising and stretching to strengthen your abdomen and improve flexibility of your spine. Your caregiver may suggest losing weight if the extra weight makes the back pain worse.  Physical therapy.  Surgery to eliminate what is pressing or pinching the nerve, such as a bone spur or part of a herniated disk. HOME CARE INSTRUCTIONS   Only take over-the-counter or prescription medicines for pain or discomfort as directed by your caregiver.  Apply ice to the affected area for 20 minutes, 3-4 times a day for the first 48-72 hours. Then try heat in the same way.  Exercise, stretch, or perform your usual activities if these do not aggravate your pain.  Attend physical therapy sessions as directed by your caregiver.  Keep all follow-up appointments as directed by your caregiver.  Do not wear high heels or shoes that do not provide proper support.  Check your mattress to see if it is too soft. A firm mattress may lessen your pain and discomfort. SEEK IMMEDIATE MEDICAL CARE IF:   You lose control of your bowel or bladder (incontinence).  You have increasing weakness in the lower back, pelvis, buttocks,   or legs.  You have redness or swelling of your back.  You have a burning sensation when you urinate.  You have pain that gets worse when you lie down or awakens you at night.  Your pain is worse than you have experienced in the past.  Your pain is lasting longer than 4 weeks.  You are suddenly losing weight without reason. MAKE SURE YOU:  Understand these  instructions.  Will watch your condition.  Will get help right away if you are not doing well or get worse. Document Released: 02/06/2001 Document Revised: 08/14/2011 Document Reviewed: 06/24/2011 ExitCare Patient Information 2015 [employer]. This information is not intended to replace advice given to you by your health care provider. Make sure you discuss any questions you have with your health care provider.  

## 2013-12-09 NOTE — Telephone Encounter (Signed)
appt made

## 2014-01-01 ENCOUNTER — Other Ambulatory Visit: Payer: Self-pay | Admitting: Nurse Practitioner

## 2014-01-01 NOTE — Telephone Encounter (Signed)
Last seen 12/09/13  Stacie Solis  Last lipid 12/05/12

## 2014-01-01 NOTE — Telephone Encounter (Signed)
no more refills without being seen  

## 2014-01-04 ENCOUNTER — Other Ambulatory Visit: Payer: Self-pay | Admitting: Nurse Practitioner

## 2014-01-04 ENCOUNTER — Telehealth: Payer: Self-pay | Admitting: Nurse Practitioner

## 2014-01-04 MED ORDER — ESCITALOPRAM OXALATE 10 MG PO TABS: ORAL_TABLET | ORAL | Status: DC

## 2014-01-04 MED ORDER — TRAZODONE HCL 50 MG PO TABS: ORAL_TABLET | ORAL | Status: DC

## 2014-01-04 MED ORDER — ATORVASTATIN CALCIUM 40 MG PO TABS: ORAL_TABLET | ORAL | Status: DC

## 2014-01-04 NOTE — Telephone Encounter (Signed)
no more refills without being seen  

## 2014-01-04 NOTE — Telephone Encounter (Signed)
NTBS for further refills.

## 2014-01-05 ENCOUNTER — Other Ambulatory Visit: Payer: Self-pay | Admitting: Family

## 2014-01-26 ENCOUNTER — Other Ambulatory Visit: Payer: Self-pay | Admitting: Family

## 2014-01-26 NOTE — Telephone Encounter (Signed)
Last ov 10/15.

## 2014-01-28 ENCOUNTER — Encounter: Payer: Self-pay | Admitting: *Deleted

## 2014-02-24 ENCOUNTER — Other Ambulatory Visit: Payer: Self-pay | Admitting: Family Medicine

## 2014-03-03 ENCOUNTER — Encounter: Payer: Self-pay | Admitting: Nurse Practitioner

## 2014-03-03 ENCOUNTER — Encounter (INDEPENDENT_AMBULATORY_CARE_PROVIDER_SITE_OTHER): Payer: Self-pay

## 2014-03-03 ENCOUNTER — Ambulatory Visit (INDEPENDENT_AMBULATORY_CARE_PROVIDER_SITE_OTHER): Payer: 59 | Admitting: Nurse Practitioner

## 2014-03-03 ENCOUNTER — Ambulatory Visit (INDEPENDENT_AMBULATORY_CARE_PROVIDER_SITE_OTHER): Payer: 59

## 2014-03-03 VITALS — BP 122/85 | HR 82 | Temp 98.9°F | Ht 62.0 in | Wt 194.0 lb

## 2014-03-03 DIAGNOSIS — Z720 Tobacco use: Secondary | ICD-10-CM

## 2014-03-03 DIAGNOSIS — G2581 Restless legs syndrome: Secondary | ICD-10-CM

## 2014-03-03 DIAGNOSIS — F329 Major depressive disorder, single episode, unspecified: Secondary | ICD-10-CM

## 2014-03-03 DIAGNOSIS — Z6835 Body mass index (BMI) 35.0-35.9, adult: Secondary | ICD-10-CM | POA: Insufficient documentation

## 2014-03-03 DIAGNOSIS — F32A Depression, unspecified: Secondary | ICD-10-CM

## 2014-03-03 DIAGNOSIS — F410 Panic disorder [episodic paroxysmal anxiety] without agoraphobia: Secondary | ICD-10-CM

## 2014-03-03 DIAGNOSIS — G4700 Insomnia, unspecified: Secondary | ICD-10-CM | POA: Insufficient documentation

## 2014-03-03 DIAGNOSIS — J0110 Acute frontal sinusitis, unspecified: Secondary | ICD-10-CM

## 2014-03-03 DIAGNOSIS — F17200 Nicotine dependence, unspecified, uncomplicated: Secondary | ICD-10-CM

## 2014-03-03 DIAGNOSIS — G500 Trigeminal neuralgia: Secondary | ICD-10-CM

## 2014-03-03 DIAGNOSIS — E785 Hyperlipidemia, unspecified: Secondary | ICD-10-CM

## 2014-03-03 DIAGNOSIS — K219 Gastro-esophageal reflux disease without esophagitis: Secondary | ICD-10-CM

## 2014-03-03 MED ORDER — AMOXICILLIN 875 MG PO TABS
875.0000 mg | ORAL_TABLET | Freq: Two times a day (BID) | ORAL | Status: DC
Start: 2014-03-03 — End: 2014-05-31

## 2014-03-03 MED ORDER — OMEPRAZOLE 40 MG PO CPDR: DELAYED_RELEASE_CAPSULE | ORAL | Status: DC

## 2014-03-03 MED ORDER — ATORVASTATIN CALCIUM 40 MG PO TABS: ORAL_TABLET | ORAL | Status: DC

## 2014-03-03 MED ORDER — TRAZODONE HCL 50 MG PO TABS: ORAL_TABLET | ORAL | Status: DC

## 2014-03-03 MED ORDER — ESCITALOPRAM OXALATE 10 MG PO TABS: ORAL_TABLET | ORAL | Status: DC

## 2014-03-03 MED ORDER — PRAMIPEXOLE DIHYDROCHLORIDE 0.5 MG PO TABS: 0.5000 mg | ORAL_TABLET | Freq: Two times a day (BID) | ORAL | Status: DC

## 2014-03-03 MED ORDER — FENOFIBRIC ACID 135 MG PO CPDR: 1.0000 | DELAYED_RELEASE_CAPSULE | Freq: Every day | ORAL | Status: DC

## 2014-03-03 NOTE — Progress Notes (Signed)
Subjective:    Patient ID: Stacie Solis, female    DOB: October 18, 1960, 54 y.o.   MRN: [id number]    Patient in today  for chronic medical problems: * c/o cough and congestion that started over a week ago- no fever * Patient says that husbands says that she kicks him all night long.  Hyperlipidemia This is a chronic problem. The problem is uncontrolled. Recent lipid tests were reviewed and are variable. Pertinent negatives include no chest pain or myalgias. Current antihyperlipidemic treatment includes statins and fibric acid derivatives. The current treatment provides moderate improvement of lipids. Compliance problems include adherence to diet and adherence to exercise.  Risk factors for coronary artery disease include dyslipidemia, obesity and post-menopausal.  Depression/insomnia Currently on trazadone an dlexapro-Works well for her depression and anxiety- but doesn't help with sleep- says that body won't wind down to rest. GERD Omeprazole is working well for symptoms.  Review of Systems  Cardiovascular: Negative for chest pain.  Musculoskeletal: Negative for myalgias.  All other systems reviewed and are negative.      Objective:   Physical Exam  Constitutional: She is oriented to person, place, and time. She appears well-developed and well-nourished.  HENT:  Nose: Nose normal.  Mouth/Throat: Oropharynx is clear and moist.  Eyes: EOM are normal.  Neck: Trachea normal, normal range of motion and full passive range of motion without pain. Neck supple. No JVD present. Carotid bruit is not present. No thyromegaly present.  Cardiovascular: Normal rate, regular rhythm, normal heart sounds and intact distal pulses.  Exam reveals no gallop and no friction rub.   No murmur heard. Pulmonary/Chest: Effort normal and breath sounds normal.  Abdominal: Soft. Bowel sounds are normal. She exhibits no distension and no mass. There is no tenderness.  Musculoskeletal: Normal range of motion.   Lymphadenopathy:    She has no cervical adenopathy.  Neurological: She is alert and oriented to person, place, and time. She has normal reflexes.  Skin: Skin is warm and dry.  Psychiatric: She has a normal mood and affect. Her behavior is normal. Judgment and thought content normal.   BP 122/85 mmHg  Pulse 82  Temp(Src) 98.9 F (37.2 C) (Oral)  Ht '5\' 2"'  (1.575 m)  Wt 194 lb (87.998 kg)  BMI 35.47 kg/m2  Stacie Nissen, FNP   Chest x ray- chronic bronchitic changes-Preliminary reading by Ronnald Collum, FNP  [facility]        Assessment & Plan:  1. Acute frontal sinusitis, recurrence not specified 1. Take meds as prescribed 2. Use a cool mist humidifier especially during the winter months and when heat has been humid. 3. Use saline nose sprays frequently 4. Saline irrigations of the nose can be very helpful if done frequently.  * 4X daily for 1 week*  * Use of a nettie pot can be helpful with this. Follow directions with this* 5. Drink plenty of fluids 6. Keep thermostat turn down low 7.For any cough or congestion  Use plain Mucinex- regular strength or max strength is fine- AVOID DECONGESTANTS   * Children- consult with Pharmacist for dosing 8. For fever or aces or pains- take tylenol or ibuprofen appropriate for age and weight.  * for fevers greater than 101 orally you may alternate ibuprofen and tylenol every  3 hours.    - amoxicillin (AMOXIL) 875 MG tablet; Take 1 tablet (875 mg total) by mouth 2 (two) times daily.  Dispense: 20 tablet; Refill: 0  2. Smoker Smoking cessation encouraged -  DG Chest 2 View; Future  3. Gastroesophageal reflux disease without esophagitis Avoid spicy foods - omeprazole (PRILOSEC) 40 MG capsule; TAKE 1 CAPSULE (40 MG TOTAL) BY MOUTH DAILY.  Dispense: 90 capsule; Refill: 1  4. Trigeminal neuralgia   5. Depression Stress management - escitalopram (LEXAPRO) 10 MG tablet; TAKE 1 TABLET (10 MG TOTAL) BY MOUTH DAILY.  Dispense: 90  tablet; Refill: 1  6. Hyperlipidemia Low fat in diet - atorvastatin (LIPITOR) 40 MG tablet; TAKE 1 TABLET (40 MG TOTAL) BY MOUTH DAILY.  Dispense: 90 tablet; Refill: 1 - Choline Fenofibrate (FENOFIBRIC ACID) 135 MG CPDR; Take 1 capsule by mouth daily.  Dispense: 90 capsule; Refill: 1 - EKG 12-Lead - CMP14+EGFR - NMR, lipoprofile  7. Panic attacks  8. BMI 35.0-35.9,adult Discussed diet and exercise for person with BMI >25 Will recheck weight in 3-6 months   9. Insomnia Bedtime ritual - traZODone (DESYREL) 50 MG tablet; TAKE 0.5-1 TABLETS (25-50 MG TOTAL) BY MOUTH AT BEDTIME AS NEEDED FOR SLEEP.  Dispense: 90 tablet; Refill: 1  10. RLS Keep legs warm - mirapex 0.5 BID #60 3 rf  Refuses all health maintenance Labs pending Health maintenance reviewed Diet and exercise encouraged Continue all meds Follow up  In 3 months   [place], FNP

## 2014-03-04 ENCOUNTER — Telehealth: Payer: Self-pay | Admitting: Nurse Practitioner

## 2014-03-04 LAB — NMR, LIPOPROFILE
Cholesterol: 116 mg/dL (ref 100–199)
HDL CHOLESTEROL BY NMR: 24 mg/dL — AB (ref >39)
HDL PARTICLE NUMBER: 28.5 umol/L — AB (ref >=30.5)
LDL Particle Number: 1201 nmol/L — ABNORMAL HIGH (ref <1000)
LDL SIZE: 21 nm (ref >20.5)
LDL-C: 64 mg/dL (ref 0–99)
LP-IR Score: 61 — ABNORMAL HIGH (ref <=45)
SMALL LDL PARTICLE NUMBER: 701 nmol/L — AB (ref <=527)
TRIGLYCERIDES BY NMR: 142 mg/dL (ref 0–149)

## 2014-03-04 LAB — CMP14+EGFR
ALBUMIN: 4.4 g/dL (ref 3.5–5.5)
ALK PHOS: 64 IU/L (ref 39–117)
ALT: 21 IU/L (ref 0–32)
AST: 25 IU/L (ref 0–40)
Albumin/Globulin Ratio: 1.9 (ref 1.1–2.5)
BILIRUBIN TOTAL: 0.3 mg/dL (ref 0.0–1.2)
BUN/Creatinine Ratio: 12 (ref 9–23)
BUN: 14 mg/dL (ref 6–24)
CALCIUM: 9.4 mg/dL (ref 8.7–10.2)
CO2: 22 mmol/L (ref 18–29)
CREATININE: 1.15 mg/dL — AB (ref 0.57–1.00)
Chloride: 104 mmol/L (ref 97–108)
GFR calc Af Amer: 62 mL/min/{1.73_m2} (ref >59)
GFR calc non Af Amer: 54 mL/min/{1.73_m2} — ABNORMAL LOW (ref >59)
Globulin, Total: 2.3 g/dL (ref 1.5–4.5)
Glucose: 97 mg/dL (ref 65–99)
Potassium: 4.6 mmol/L (ref 3.5–5.2)
Sodium: 140 mmol/L (ref 134–144)
TOTAL PROTEIN: 6.7 g/dL (ref 6.0–8.5)

## 2014-03-04 NOTE — Telephone Encounter (Signed)
-----   Message from [facility], [place] sent at 03/04/2014  1:14 PM EST ----- Kidney and liver function stable Creatine up slightly will continue to watch Cholesterol looks great Continue current meds- low fat diet and exercise and recheck in 3 months

## 2014-03-05 ENCOUNTER — Encounter: Payer: Self-pay | Admitting: Family Medicine

## 2014-03-05 NOTE — Telephone Encounter (Signed)
Pt aware of lab results 

## 2014-03-12 ENCOUNTER — Other Ambulatory Visit: Payer: Self-pay | Admitting: Nurse Practitioner

## 2014-03-23 ENCOUNTER — Encounter: Payer: Self-pay | Admitting: *Deleted

## 2014-04-03 ENCOUNTER — Other Ambulatory Visit: Payer: Self-pay | Admitting: Nurse Practitioner

## 2014-04-08 ENCOUNTER — Other Ambulatory Visit: Payer: Self-pay | Admitting: Family Medicine

## 2014-04-08 ENCOUNTER — Other Ambulatory Visit: Payer: Self-pay | Admitting: Nurse Practitioner

## 2014-05-12 ENCOUNTER — Other Ambulatory Visit: Payer: Self-pay | Admitting: Family Medicine

## 2014-05-12 NOTE — Telephone Encounter (Signed)
Last seen 03/03/14 MMM

## 2014-05-19 ENCOUNTER — Other Ambulatory Visit: Payer: Self-pay | Admitting: Family Medicine

## 2014-05-19 ENCOUNTER — Other Ambulatory Visit: Payer: Self-pay

## 2014-05-19 ENCOUNTER — Telehealth: Payer: Self-pay

## 2014-05-19 DIAGNOSIS — G2581 Restless legs syndrome: Secondary | ICD-10-CM

## 2014-05-19 DIAGNOSIS — R921 Mammographic calcification found on diagnostic imaging of breast: Secondary | ICD-10-CM

## 2014-05-19 MED ORDER — PRAMIPEXOLE DIHYDROCHLORIDE 0.5 MG PO TABS: 0.5000 mg | ORAL_TABLET | Freq: Two times a day (BID) | ORAL | Status: DC

## 2014-05-31 ENCOUNTER — Ambulatory Visit (INDEPENDENT_AMBULATORY_CARE_PROVIDER_SITE_OTHER): Payer: 59 | Admitting: Nurse Practitioner

## 2014-05-31 ENCOUNTER — Encounter: Payer: Self-pay | Admitting: Nurse Practitioner

## 2014-05-31 VITALS — BP 137/80 | HR 80 | Temp 98.6°F | Ht 62.0 in | Wt 196.0 lb

## 2014-05-31 DIAGNOSIS — Z09 Encounter for follow-up examination after completed treatment for conditions other than malignant neoplasm: Secondary | ICD-10-CM | POA: Diagnosis not present

## 2014-05-31 DIAGNOSIS — J189 Pneumonia, unspecified organism: Secondary | ICD-10-CM

## 2014-05-31 NOTE — Patient Instructions (Signed)
Smoking Cessation Quitting smoking is important to your health and has many advantages. However, it is not always easy to quit since nicotine is a very addictive drug. Oftentimes, people try 3 times or more before being able to quit. This document explains the best ways for you to prepare to quit smoking. Quitting takes hard work and a lot of effort, but you can do it. ADVANTAGES OF QUITTING SMOKING  You will live longer, feel better, and live better.  Your body will feel the impact of quitting smoking almost immediately.  Within 20 minutes, blood pressure decreases. Your pulse returns to its normal level.  After 8 hours, carbon monoxide levels in the blood return to normal. Your oxygen level increases.  After 24 hours, the chance of having a heart attack starts to decrease. Your breath, hair, and body stop smelling like smoke.  After 48 hours, damaged nerve endings begin to recover. Your sense of taste and smell improve.  After 72 hours, the body is virtually free of nicotine. Your bronchial tubes relax and breathing becomes easier.  After 2 to 12 weeks, lungs can hold more air. Exercise becomes easier and circulation improves.  The risk of having a heart attack, stroke, cancer, or lung disease is greatly reduced.  After 1 year, the risk of coronary heart disease is cut in half.  After 5 years, the risk of stroke falls to the same as a nonsmoker.  After 10 years, the risk of lung cancer is cut in half and the risk of other cancers decreases significantly.  After 15 years, the risk of coronary heart disease drops, usually to the level of a nonsmoker.  If you are pregnant, quitting smoking will improve your chances of having a healthy baby.  The people you live with, especially any children, will be healthier.  You will have extra money to spend on things other than cigarettes. QUESTIONS TO THINK ABOUT BEFORE ATTEMPTING TO QUIT You may want to talk about your answers with your  health care provider.  Why do you want to quit?  If you tried to quit in the past, what helped and what did not?  What will be the most difficult situations for you after you quit? How will you plan to handle them?  Who can help you through the tough times? Your family? Friends? A health care provider?  What pleasures do you get from smoking? What ways can you still get pleasure if you quit? Here are some questions to ask your health care provider:  How can you help me to be successful at quitting?  What medicine do you think would be best for me and how should I take it?  What should I do if I need more help?  What is smoking withdrawal like? How can I get information on withdrawal? GET READY  Set a quit date.  Change your environment by getting rid of all cigarettes, ashtrays, matches, and lighters in your home, car, or work. Do not let people smoke in your home.  Review your past attempts to quit. Think about what worked and what did not. GET SUPPORT AND ENCOURAGEMENT You have a better chance of being successful if you have help. You can get support in many ways.  Tell your family, friends, and coworkers that you are going to quit and need their support. Ask them not to smoke around you.  Get individual, group, or telephone counseling and support. Programs are available at local hospitals and health centers. Call   your local health department for information about programs in your area.  Spiritual beliefs and practices may help some smokers quit.  Download a "quit meter" on your computer to keep track of quit statistics, such as how long you have gone without smoking, cigarettes not smoked, and money saved.  Get a self-help book about quitting smoking and staying off tobacco. LEARN NEW SKILLS AND BEHAVIORS  Distract yourself from urges to smoke. Talk to someone, go for a walk, or occupy your time with a task.  Change your normal routine. Take a different route to work.  Drink tea instead of coffee. Eat breakfast in a different place.  Reduce your stress. Take a hot bath, exercise, or read a book.  Plan something enjoyable to do every day. Reward yourself for not smoking.  Explore interactive web-based programs that specialize in helping you quit. GET MEDICINE AND USE IT CORRECTLY Medicines can help you stop smoking and decrease the urge to smoke. Combining medicine with the above behavioral methods and support can greatly increase your chances of successfully quitting smoking.  Nicotine replacement therapy helps deliver nicotine to your body without the negative effects and risks of smoking. Nicotine replacement therapy includes nicotine gum, lozenges, inhalers, nasal sprays, and skin patches. Some may be available over-the-counter and others require a prescription.  Antidepressant medicine helps people abstain from smoking, but how this works is unknown. This medicine is available by prescription.  Nicotinic receptor partial agonist medicine simulates the effect of nicotine in your brain. This medicine is available by prescription. Ask your health care provider for advice about which medicines to use and how to use them based on your health history. Your health care provider will tell you what side effects to look out for if you choose to be on a medicine or therapy. Carefully read the information on the package. Do not use any other product containing nicotine while using a nicotine replacement product.  RELAPSE OR DIFFICULT SITUATIONS Most relapses occur within the first 3 months after quitting. Do not be discouraged if you start smoking again. Remember, most people try several times before finally quitting. You may have symptoms of withdrawal because your body is used to nicotine. You may crave cigarettes, be irritable, feel very hungry, cough often, get headaches, or have difficulty concentrating. The withdrawal symptoms are only temporary. They are strongest  when you first quit, but they will go away within 10-14 days. To reduce the chances of relapse, try to:  Avoid drinking alcohol. Drinking lowers your chances of successfully quitting.  Reduce the amount of caffeine you consume. Once you quit smoking, the amount of caffeine in your body increases and can give you symptoms, such as a rapid heartbeat, sweating, and anxiety.  Avoid smokers because they can make you want to smoke.  Do not let weight gain distract you. Many smokers will gain weight when they quit, usually less than 10 pounds. Eat a healthy diet and stay active. You can always lose the weight gained after you quit.  Find ways to improve your mood other than smoking. FOR MORE INFORMATION  www.smokefree.gov  Document Released: 02/06/2001 Document Revised: 06/29/2013 Document Reviewed: 05/24/2011 Exit[employer] Patient Information 2015 [employer]. This information is not intended to replace advice given to you by your health care provider. Make sure you discuss any questions you have with your health care provider.  

## 2014-05-31 NOTE — Progress Notes (Signed)
   Subjective:    Patient ID: GENEVE KIMPEL, female    DOB: August 21, 1960, 54 y.o.   MRN: [id number]  HPI patient here today for hospital follow up- she was dx with pneumonia by urgent care- was given cefdinir, z pak and prednisone- Is doing much better- no sob for the last 2 days. Thinks she is still feverish but has nit taken her temperature. SHe is a smoker.    Review of Systems  Constitutional: Negative.   HENT: Negative.   Respiratory: Negative.   Cardiovascular: Negative.   Gastrointestinal: Negative.   Genitourinary: Negative.   Neurological: Negative.   Hematological: Negative.   Psychiatric/Behavioral: Negative.   All other systems reviewed and are negative.      Objective:   Physical Exam  Constitutional: She is oriented to person, place, and time. She appears well-developed and well-nourished. No distress.  Cardiovascular: Normal rate, regular rhythm and normal heart sounds.   Pulmonary/Chest: Effort normal. She has wheezes (insp and exp bil bases. diminished breath sounds left lower lbe.).  Neurological: She is alert and oriented to person, place, and time.  Skin: Skin is warm and dry.  Psychiatric: She has a normal mood and affect. Her behavior is normal. Judgment and thought content normal.   BP 137/80 mmHg  Pulse 80  Temp(Src) 98.6 F (37 C) (Oral)  Ht 5\' 2"  (1.575 m)  Wt 196 lb (88.905 kg)  BMI 35.84 kg/m2        Assessment & Plan:   1. Hospital discharge follow-up   2. CAP (community acquired pneumonia)    Finish antibiotic Prednisone as rx STOP SMOKING! Follow up in 6 weeks to recheck chest x ray  [name], FNP

## 2014-06-08 ENCOUNTER — Ambulatory Visit (INDEPENDENT_AMBULATORY_CARE_PROVIDER_SITE_OTHER): Payer: 59 | Admitting: Nurse Practitioner

## 2014-06-08 ENCOUNTER — Encounter: Payer: Self-pay | Admitting: Nurse Practitioner

## 2014-06-08 VITALS — BP 132/80 | HR 90 | Temp 97.2°F | Ht 62.0 in | Wt 196.0 lb

## 2014-06-08 DIAGNOSIS — Z6835 Body mass index (BMI) 35.0-35.9, adult: Secondary | ICD-10-CM | POA: Diagnosis not present

## 2014-06-08 DIAGNOSIS — K219 Gastro-esophageal reflux disease without esophagitis: Secondary | ICD-10-CM | POA: Diagnosis not present

## 2014-06-08 DIAGNOSIS — F32A Depression, unspecified: Secondary | ICD-10-CM

## 2014-06-08 DIAGNOSIS — E785 Hyperlipidemia, unspecified: Secondary | ICD-10-CM | POA: Diagnosis not present

## 2014-06-08 DIAGNOSIS — G500 Trigeminal neuralgia: Secondary | ICD-10-CM | POA: Diagnosis not present

## 2014-06-08 DIAGNOSIS — M545 Low back pain, unspecified: Secondary | ICD-10-CM | POA: Insufficient documentation

## 2014-06-08 DIAGNOSIS — Z0000 Encounter for general adult medical examination without abnormal findings: Secondary | ICD-10-CM

## 2014-06-08 DIAGNOSIS — G4700 Insomnia, unspecified: Secondary | ICD-10-CM | POA: Diagnosis not present

## 2014-06-08 DIAGNOSIS — Z01419 Encounter for gynecological examination (general) (routine) without abnormal findings: Secondary | ICD-10-CM

## 2014-06-08 DIAGNOSIS — G2581 Restless legs syndrome: Secondary | ICD-10-CM | POA: Diagnosis not present

## 2014-06-08 DIAGNOSIS — F410 Panic disorder [episodic paroxysmal anxiety] without agoraphobia: Secondary | ICD-10-CM

## 2014-06-08 DIAGNOSIS — F329 Major depressive disorder, single episode, unspecified: Secondary | ICD-10-CM

## 2014-06-08 DIAGNOSIS — D72829 Elevated white blood cell count, unspecified: Secondary | ICD-10-CM

## 2014-06-08 DIAGNOSIS — G8929 Other chronic pain: Secondary | ICD-10-CM | POA: Diagnosis not present

## 2014-06-08 LAB — POCT UA - MICROSCOPIC ONLY
BACTERIA, U MICROSCOPIC: NEGATIVE
CASTS, UR, LPF, POC: NEGATIVE
Crystals, Ur, HPF, POC: NEGATIVE
MUCUS UA: NEGATIVE
RBC, urine, microscopic: NEGATIVE
WBC, UR, HPF, POC: NEGATIVE
Yeast, UA: NEGATIVE

## 2014-06-08 LAB — POCT CBC
GRANULOCYTE PERCENT: 70.5 % (ref 37–80)
HCT, POC: 48.7 % — AB (ref 37.7–47.9)
Hemoglobin: 15.3 g/dL (ref 12.2–16.2)
LYMPH, POC: 4.3 — AB (ref 0.6–3.4)
MCH, POC: 29.4 pg (ref 27–31.2)
MCHC: 31.4 g/dL — AB (ref 31.8–35.4)
MCV: 93.7 fL (ref 80–97)
MPV: 8.4 fL (ref 0–99.8)
PLATELET COUNT, POC: 251 10*3/uL (ref 142–424)
POC GRANULOCYTE: 12.3 — AB (ref 2–6.9)
POC LYMPH PERCENT: 24.3 %L (ref 10–50)
RBC: 5.19 M/uL (ref 4.04–5.48)
RDW, POC: 14.5 %
WBC: 17.5 10*3/uL — AB (ref 4.6–10.2)

## 2014-06-08 LAB — POCT URINALYSIS DIPSTICK
Bilirubin, UA: NEGATIVE
Blood, UA: NEGATIVE
Glucose, UA: NEGATIVE
Ketones, UA: NEGATIVE
LEUKOCYTES UA: NEGATIVE
Nitrite, UA: NEGATIVE
PROTEIN UA: NEGATIVE
Spec Grav, UA: <=1.005
UROBILINOGEN UA: NEGATIVE
pH, UA: 7.5

## 2014-06-08 MED ORDER — ZOLPIDEM TARTRATE 10 MG PO TABS: 10.0000 mg | ORAL_TABLET | Freq: Every evening | ORAL | Status: DC | PRN

## 2014-06-08 MED ORDER — TRAMADOL HCL 50 MG PO TABS: 50.0000 mg | ORAL_TABLET | Freq: Three times a day (TID) | ORAL | Status: DC | PRN

## 2014-06-08 NOTE — Addendum Note (Signed)
Addended by: Earlene Plater on: 06/08/2014 12:43 PM   Modules accepted: Orders

## 2014-06-08 NOTE — Progress Notes (Signed)
Subjective:    Patient ID: Stacie Solis, female    DOB: May 28, 1960, 54 y.o.   MRN: [id number]    Patient is here for a full physical with pap and follow up of chronic medical conditions.  The patient states she was seen and diagnosed with pneumonia-just finished antibiotics 1 week ago. The patient states she is feeling much better, denies dyspnea.   The patient is also c/o lower back pain "like a catch" with radiates to the right hip- this has progressed to constant pain of 5/10 and 8/10 with movement - she is taking meloxicam and flexeril since last year when this issue began but it does not seem to be helping.  Hyperlipidemia This is a chronic problem. The current episode started more than 1 year ago. Recent lipid tests were reviewed and are variable. Exacerbating diseases include obesity. She has no history of diabetes or hypothyroidism. Pertinent negatives include no chest pain, myalgias or shortness of breath. Current antihyperlipidemic treatment includes statins and fibric acid derivatives. The current treatment provides moderate improvement of lipids. Compliance problems include adherence to diet and adherence to exercise.  Risk factors for coronary artery disease include dyslipidemia, obesity and post-menopausal.  Depression/insomnia Taking lexapro 10 mg and trazodone 50 mg-Works well for her depression and anxiety- but doesn't help with sleep- says that body won't wind down to rest. Typically gets 4-5 hours of rest, wakes up fatigued.  RLS Well controlled with mirapex 0.5 mg Sinusitis Well controlled with nasacort and OTC medications as needed.  GERD Omeprazole is working well for symptoms-no break through episodes.   Review of Systems  Constitutional: Positive for fatigue (When unable to sleep). Negative for activity change, appetite change and unexpected weight change.  HENT: Negative.  Negative for congestion, postnasal drip, sinus pressure, trouble swallowing and voice change.    Eyes: Negative.  Negative for pain, discharge, itching and visual disturbance.  Respiratory: Negative for cough, chest tightness, shortness of breath and wheezing.   Cardiovascular: Negative for chest pain, palpitations and leg swelling.  Gastrointestinal: Negative.  Negative for nausea, vomiting, constipation, blood in stool, anal bleeding and rectal pain.  Endocrine: Positive for heat intolerance. Negative for polydipsia, polyphagia and polyuria.  Genitourinary: Negative.  Negative for frequency and difficulty urinating.  Musculoskeletal: Positive for back pain (Lower back pain radiating to the right hip). Negative for myalgias.  Skin: Negative.   Allergic/Immunologic: Positive for environmental allergies.  Neurological: Negative for dizziness, syncope, weakness, light-headedness, numbness and headaches.  Hematological: Negative.  Does not bruise/bleed easily.  Psychiatric/Behavioral: Positive for sleep disturbance (Difficulty falling asleep and staying asleep. ). Negative for suicidal ideas, behavioral problems, confusion, self-injury and decreased concentration. The patient is nervous/anxious (Only with major events).   All other systems reviewed and are negative.      Objective:   Physical Exam  Constitutional: She is oriented to person, place, and time. She appears well-developed and well-nourished.  HENT:  Head: Normocephalic.  Right Ear: Hearing, tympanic membrane, external ear and ear canal normal.  Left Ear: Hearing, tympanic membrane, external ear and ear canal normal.  Nose: Nose normal.  Mouth/Throat: Uvula is midline and oropharynx is clear and moist.  Eyes: Conjunctivae and EOM are normal. Pupils are equal, round, and reactive to light.  Neck: Trachea normal, normal range of motion and full passive range of motion without pain. Neck supple. No JVD present. Carotid bruit is not present. No thyroid mass and no thyromegaly present.  Cardiovascular: Normal rate, regular  rhythm, normal  heart sounds and intact distal pulses.  Exam reveals no gallop and no friction rub.   No murmur heard. Pulmonary/Chest: Effort normal. She has rales in the right lower field, the left middle field and the left lower field. Right breast exhibits no inverted nipple, no mass, no nipple discharge, no skin change and no tenderness. Left breast exhibits no inverted nipple, no mass, no nipple discharge, no skin change and no tenderness.  Abdominal: Soft. Bowel sounds are normal. She exhibits no distension and no mass. There is no tenderness.  Genitourinary: Vagina normal and uterus normal. No breast swelling, tenderness, discharge or bleeding. There is no rash on the right labia. No vaginal discharge found.  bimanual exam-No adnexal masses or tenderness. Cervix parous and pink No discharge  Musculoskeletal: Normal range of motion. She exhibits no edema.       Lumbar back: She exhibits tenderness. She exhibits normal range of motion, no bony tenderness, no edema and no deformity.  Full lumbar active ROM -increased right hip pain with this. No bony tenderness over spine or sacrum-tenderness right laterally to upper sacrum.   Lymphadenopathy:    She has no cervical adenopathy.  Neurological: She is alert and oriented to person, place, and time. She has normal strength and normal reflexes. No cranial nerve deficit or sensory deficit. Coordination and gait normal.  Skin: Skin is warm and dry.  Psychiatric: She has a normal mood and affect. Her behavior is normal. Judgment and thought content normal.   BP 132/80 mmHg  Pulse 90  Temp(Src) 97.2 F (36.2 C) (Oral)  Ht _0  (1.575 m)  Wt 196 lb (88.905 kg)  BMI 35.84 kg/m2  Results for orders placed or performed in visit on 06/08/14  POCT UA - Microscopic Only  Result Value Ref Range   WBC, Ur, HPF, POC neg    RBC, urine, microscopic neg    Bacteria, U Microscopic neg    Mucus, UA neg    Epithelial cells, urine per micros occ     Crystals, Ur, HPF, POC neg    Casts, Ur, LPF, POC neg    Yeast, UA neg   POCT urinalysis dipstick  Result Value Ref Range   Color, UA yellow    Clarity, UA clear    Glucose, UA neg    Bilirubin, UA neg    Ketones, UA neg    Spec Grav, UA <=1.005    Blood, UA neg    pH, UA 7.5    Protein, UA neg'    Urobilinogen, UA negative    Nitrite, UA neg    Leukocytes, UA Negative         Assessment & Plan:   1. Annual physical exam - POCT UA - Microscopic Only - POCT urinalysis dipstick - POCT CBC - Thyroid Panel With TSH - Vit D  25 hydroxy (rtn osteoporosis monitoring)  2. Gastroesophageal reflux disease without esophagitis Continue with omeprazole-eat small meals, avoid aggravating foods.   3. Trigeminal neuralgia  4. RLS (restless legs syndrome) Continue with medication.   5. Panic attacks Stress reduction  6. Insomnia Follow a bedtime routine Added ambien today - zolpidem (AMBIEN) 10 MG tablet; Take 1 tablet (10 mg total) by mouth at bedtime as needed for sleep.  Dispense: 30 tablet; Refill: 1  7. Hyperlipidemia Low fat diet, continue with lipitor and fibrate.  - CMP14+EGFR - NMR, lipoprofile  8. Depression Stress reduction  9. BMI 35.0-35.9,adult Diet and exercise encouraged.   10. Chronic lower  back pain Adde ultram - traMADol (ULTRAM) 50 MG tablet; Take 1 tablet (50 mg total) by mouth every 8 (eight) hours as needed.  Dispense: 30 tablet; Refill: 0 - Ambulatory referral to Orthopedic Surgery  11. Encounter for routine gynecological examination - Pap IG w/ reflex to HPV when ASC-U    Labs pending Health maintenance reviewed Diet and exercise encouraged Continue all meds Follow up as scheduled   [name], FNP

## 2014-06-08 NOTE — Patient Instructions (Signed)
Fat and Cholesterol Control Diet Fat and cholesterol levels in your blood and organs are influenced by your diet. High levels of fat and cholesterol may lead to diseases of the heart, small and large blood vessels, gallbladder, liver, and pancreas. CONTROLLING FAT AND CHOLESTEROL WITH DIET Although exercise and lifestyle factors are important, your diet is key. That is because certain foods are known to raise cholesterol and others to lower it. The goal is to balance foods for their effect on cholesterol and more importantly, to replace saturated and trans fat with other types of fat, such as monounsaturated fat, polyunsaturated fat, and omega-3 fatty acids. On average, a person should consume no more than 15 to 17 g of saturated fat daily. Saturated and trans fats are considered "bad" fats, and they will raise LDL cholesterol. Saturated fats are primarily found in animal products such as meats, butter, and cream. However, that does not mean you need to give up all your favorite foods. Today, there are good tasting, low-fat, low-cholesterol substitutes for most of the things you like to eat. Choose low-fat or nonfat alternatives. Choose round or loin cuts of red meat. These types of cuts are lowest in fat and cholesterol. Chicken (without the skin), fish, veal, and ground turkey breast are great choices. Eliminate fatty meats, such as hot dogs and salami. Even shellfish have little or no saturated fat. Have a 3 oz (85 g) portion when you eat lean meat, poultry, or fish. Trans fats are also called "partially hydrogenated oils." They are oils that have been scientifically manipulated so that they are solid at room temperature resulting in a longer shelf life and improved taste and texture of foods in which they are added. Trans fats are found in stick margarine, some tub margarines, cookies, crackers, and baked goods.  When baking and cooking, oils are a great substitute for butter. The monounsaturated oils are  especially beneficial since it is believed they lower LDL and raise HDL. The oils you should avoid entirely are saturated tropical oils, such as coconut and palm.  Remember to eat a lot from food groups that are naturally free of saturated and trans fat, including fish, fruit, vegetables, beans, grains (barley, rice, couscous, bulgur wheat), and pasta (without cream sauces).  IDENTIFYING FOODS THAT LOWER FAT AND CHOLESTEROL  Soluble fiber may lower your cholesterol. This type of fiber is found in fruits such as apples, vegetables such as broccoli, potatoes, and carrots, legumes such as beans, peas, and lentils, and grains such as barley. Foods fortified with plant sterols (phytosterol) may also lower cholesterol. You should eat at least 2 g per day of these foods for a cholesterol lowering effect.  Read package labels to identify low-saturated fats, trans fat free, and low-fat foods at the supermarket. Select cheeses that have only 2 to 3 g saturated fat per ounce. Use a heart-healthy tub margarine that is free of trans fats or partially hydrogenated oil. When buying baked goods (cookies, crackers), avoid partially hydrogenated oils. Breads and muffins should be made from whole grains (whole-wheat or whole oat flour, instead of "flour" or "enriched flour"). Buy non-creamy canned soups with reduced salt and no added fats.  FOOD PREPARATION TECHNIQUES  Never deep-fry. If you must fry, either stir-fry, which uses very little fat, or use non-stick cooking sprays. When possible, broil, bake, or roast meats, and steam vegetables. Instead of putting butter or margarine on vegetables, use lemon and herbs, applesauce, and cinnamon (for squash and sweet potatoes). Use nonfat   yogurt, salsa, and low-fat dressings for salads.  LOW-SATURATED FAT / LOW-FAT FOOD SUBSTITUTES Meats / Saturated Fat (g)  Avoid: Steak, marbled (3 oz/85 g) / 11 g  Choose: Steak, lean (3 oz/85 g) / 4 g  Avoid: Hamburger (3 oz/85 g) / 7  g  Choose: Hamburger, lean (3 oz/85 g) / 5 g  Avoid: Ham (3 oz/85 g) / 6 g  Choose: Ham, lean cut (3 oz/85 g) / 2.4 g  Avoid: Chicken, with skin, dark meat (3 oz/85 g) / 4 g  Choose: Chicken, skin removed, dark meat (3 oz/85 g) / 2 g  Avoid: Chicken, with skin, light meat (3 oz/85 g) / 2.5 g  Choose: Chicken, skin removed, light meat (3 oz/85 g) / 1 g Dairy / Saturated Fat (g)  Avoid: Whole milk (1 cup) / 5 g  Choose: Low-fat milk, 2% (1 cup) / 3 g  Choose: Low-fat milk, 1% (1 cup) / 1.5 g  Choose: Skim milk (1 cup) / 0.3 g  Avoid: Hard cheese (1 oz/28 g) / 6 g  Choose: Skim milk cheese (1 oz/28 g) / 2 to 3 g  Avoid: Cottage cheese, 4% fat (1 cup) / 6.5 g  Choose: Low-fat cottage cheese, 1% fat (1 cup) / 1.5 g  Avoid: Ice cream (1 cup) / 9 g  Choose: Sherbet (1 cup) / 2.5 g  Choose: Nonfat frozen yogurt (1 cup) / 0.3 g  Choose: Frozen fruit bar / trace  Avoid: Whipped cream (1 tbs) / 3.5 g  Choose: Nondairy whipped topping (1 tbs) / 1 g Condiments / Saturated Fat (g)  Avoid: Mayonnaise (1 tbs) / 2 g  Choose: Low-fat mayonnaise (1 tbs) / 1 g  Avoid: Butter (1 tbs) / 7 g  Choose: Extra light margarine (1 tbs) / 1 g  Avoid: Coconut oil (1 tbs) / 11.8 g  Choose: Olive oil (1 tbs) / 1.8 g  Choose: Corn oil (1 tbs) / 1.7 g  Choose: Safflower oil (1 tbs) / 1.2 g  Choose: Sunflower oil (1 tbs) / 1.4 g  Choose: Soybean oil (1 tbs) / 2.4 g  Choose: Canola oil (1 tbs) / 1 g Document Released: 02/12/2005 Document Revised: 06/09/2012 Document Reviewed: 05/13/2013 [employer] Patient Information 2015 [employer]. This information is not intended to replace advice given to you by your health care provider. Make sure you discuss any questions you have with your health care provider.  

## 2014-06-09 LAB — NMR, LIPOPROFILE
CHOLESTEROL: 124 mg/dL (ref 100–199)
HDL CHOLESTEROL BY NMR: 39 mg/dL — AB (ref >39)
HDL Particle Number: 34.6 umol/L (ref >=30.5)
LDL Particle Number: 1203 nmol/L — ABNORMAL HIGH (ref <1000)
LDL Size: 20.7 nm (ref >20.5)
LDL-C: 68 mg/dL (ref 0–99)
LP-IR Score: 56 — ABNORMAL HIGH (ref <=45)
Small LDL Particle Number: 676 nmol/L — ABNORMAL HIGH (ref <=527)
Triglycerides by NMR: 83 mg/dL (ref 0–149)

## 2014-06-09 LAB — CMP14+EGFR
ALBUMIN: 4.1 g/dL (ref 3.5–5.5)
ALT: 27 IU/L (ref 0–32)
AST: 21 IU/L (ref 0–40)
Albumin/Globulin Ratio: 1.6 (ref 1.1–2.5)
Alkaline Phosphatase: 60 IU/L (ref 39–117)
BILIRUBIN TOTAL: 0.4 mg/dL (ref 0.0–1.2)
BUN / CREAT RATIO: 11 (ref 9–23)
BUN: 13 mg/dL (ref 6–24)
CO2: 23 mmol/L (ref 18–29)
Calcium: 9.1 mg/dL (ref 8.7–10.2)
Chloride: 100 mmol/L (ref 97–108)
Creatinine, Ser: 1.2 mg/dL — ABNORMAL HIGH (ref 0.57–1.00)
GFR calc non Af Amer: 51 mL/min/{1.73_m2} — ABNORMAL LOW (ref >59)
GFR, EST AFRICAN AMERICAN: 59 mL/min/{1.73_m2} — AB (ref >59)
GLOBULIN, TOTAL: 2.5 g/dL (ref 1.5–4.5)
Glucose: 93 mg/dL (ref 65–99)
POTASSIUM: 4.3 mmol/L (ref 3.5–5.2)
SODIUM: 138 mmol/L (ref 134–144)
Total Protein: 6.6 g/dL (ref 6.0–8.5)

## 2014-06-09 LAB — CBC WITH DIFFERENTIAL/PLATELET
BASOS ABS: 0.2 10*3/uL (ref 0.0–0.2)
BASOS: 1 %
Eos: 2 %
Eosinophils Absolute: 0.3 10*3/uL (ref 0.0–0.4)
HCT: 46.6 % (ref 34.0–46.6)
Hemoglobin: 15.4 g/dL (ref 11.1–15.9)
Immature Grans (Abs): 0.5 10*3/uL — ABNORMAL HIGH (ref 0.0–0.1)
Immature Granulocytes: 3 %
LYMPHS: 24 %
Lymphocytes Absolute: 4.1 10*3/uL — ABNORMAL HIGH (ref 0.7–3.1)
MCH: 31.1 pg (ref 26.6–33.0)
MCHC: 33 g/dL (ref 31.5–35.7)
MCV: 94 fL (ref 79–97)
MONOS ABS: 1.1 10*3/uL — AB (ref 0.1–0.9)
Monocytes: 7 %
NEUTROS PCT: 63 %
Neutrophils Absolute: 10.7 10*3/uL — ABNORMAL HIGH (ref 1.4–7.0)
Platelets: 263 10*3/uL (ref 150–379)
RBC: 4.95 x10E6/uL (ref 3.77–5.28)
RDW: 13.7 % (ref 12.3–15.4)
WBC: 17 10*3/uL — AB (ref 3.4–10.8)

## 2014-06-09 LAB — THYROID PANEL WITH TSH
Free Thyroxine Index: 1.9 (ref 1.2–4.9)
T3 Uptake Ratio: 26 % (ref 24–39)
T4 TOTAL: 7.2 ug/dL (ref 4.5–12.0)
TSH: 2.38 u[IU]/mL (ref 0.450–4.500)

## 2014-06-09 LAB — VITAMIN D 25 HYDROXY (VIT D DEFICIENCY, FRACTURES): VIT D 25 HYDROXY: 31.2 ng/mL (ref 30.0–100.0)

## 2014-06-10 ENCOUNTER — Telehealth: Payer: Self-pay | Admitting: Nurse Practitioner

## 2014-06-10 DIAGNOSIS — D72829 Elevated white blood cell count, unspecified: Secondary | ICD-10-CM

## 2014-06-10 LAB — PAP IG W/ RFLX HPV ASCU: PAP SMEAR COMMENT: 0

## 2014-06-10 NOTE — Telephone Encounter (Signed)
Try taking 2 of the 5mg  at bedtime and see if that works

## 2014-06-10 NOTE — Telephone Encounter (Signed)
Discussed lab results with patient. Future order placed for CBC. Advised patient to try 2 Ambien at bedtime.

## 2014-06-11 ENCOUNTER — Encounter: Payer: Self-pay | Admitting: *Deleted

## 2014-06-11 ENCOUNTER — Telehealth: Payer: Self-pay | Admitting: Nurse Practitioner

## 2014-06-11 NOTE — Addendum Note (Signed)
Addended by: Thana Ates on: 06/11/2014 10:00 AM   Modules accepted: Orders

## 2014-06-14 NOTE — Telephone Encounter (Signed)
Spoke with son and labs reviewed

## 2014-06-15 ENCOUNTER — Other Ambulatory Visit (INDEPENDENT_AMBULATORY_CARE_PROVIDER_SITE_OTHER): Payer: 59

## 2014-06-15 DIAGNOSIS — D72829 Elevated white blood cell count, unspecified: Secondary | ICD-10-CM | POA: Diagnosis not present

## 2014-06-15 LAB — POCT CBC
Granulocyte percent: 61.3 %G (ref 37–80)
HCT, POC: 49.1 % — AB (ref 37.7–47.9)
Hemoglobin: 15.2 g/dL (ref 12.2–16.2)
LYMPH, POC: 3.7 — AB (ref 0.6–3.4)
MCH: 29.3 pg (ref 27–31.2)
MCHC: 31 g/dL — AB (ref 31.8–35.4)
MCV: 94.6 fL (ref 80–97)
MPV: 8.3 fL (ref 0–99.8)
POC Granulocyte: 7.6 — AB (ref 2–6.9)
POC LYMPH %: 30 % (ref 10–50)
Platelet Count, POC: 247 10*3/uL (ref 142–424)
RBC: 5.19 M/uL (ref 4.04–5.48)
RDW, POC: 14.1 %
WBC: 12.4 10*3/uL — AB (ref 4.6–10.2)

## 2014-06-15 NOTE — Progress Notes (Signed)
Lab only 

## 2014-06-15 NOTE — Addendum Note (Signed)
Addended by: Thana Ates on: 06/15/2014 04:19 PM   Modules accepted: Orders

## 2014-06-18 ENCOUNTER — Other Ambulatory Visit: Payer: Self-pay | Admitting: *Deleted

## 2014-06-18 ENCOUNTER — Other Ambulatory Visit: Payer: Self-pay | Admitting: Nurse Practitioner

## 2014-06-18 ENCOUNTER — Other Ambulatory Visit (INDEPENDENT_AMBULATORY_CARE_PROVIDER_SITE_OTHER): Payer: 59

## 2014-06-18 DIAGNOSIS — D72829 Elevated white blood cell count, unspecified: Secondary | ICD-10-CM

## 2014-06-18 LAB — POCT CBC
Granulocyte percent: 60 %G (ref 37–80)
HCT, POC: 45.3 % (ref 37.7–47.9)
Hemoglobin: 14.4 g/dL (ref 12.2–16.2)
Lymph, poc: 3.1 (ref 0.6–3.4)
MCH, POC: 29.9 pg (ref 27–31.2)
MCHC: 31.8 g/dL (ref 31.8–35.4)
MCV: 94.2 fL (ref 80–97)
MPV: 8.6 fL (ref 0–99.8)
POC GRANULOCYTE: 5.9 (ref 2–6.9)
POC LYMPH PERCENT: 31.5 %L (ref 10–50)
Platelet Count, POC: 251 10*3/uL (ref 142–424)
RBC: 4.82 M/uL (ref 4.04–5.48)
RDW, POC: 14.5 %
WBC: 9.9 10*3/uL (ref 4.6–10.2)

## 2014-06-18 MED ORDER — ALPRAZOLAM 0.5 MG PO TABS: 0.5000 mg | ORAL_TABLET | Freq: Every evening | ORAL | Status: DC | PRN

## 2014-06-18 NOTE — Progress Notes (Signed)
LAB ONLY 

## 2014-06-22 ENCOUNTER — Other Ambulatory Visit: Payer: Self-pay | Admitting: Nurse Practitioner

## 2014-06-23 ENCOUNTER — Telehealth: Payer: Self-pay | Admitting: Nurse Practitioner

## 2014-06-23 ENCOUNTER — Other Ambulatory Visit: Payer: Self-pay | Admitting: Nurse Practitioner

## 2014-06-23 DIAGNOSIS — G8929 Other chronic pain: Secondary | ICD-10-CM

## 2014-06-23 DIAGNOSIS — M545 Low back pain, unspecified: Secondary | ICD-10-CM

## 2014-06-23 MED ORDER — TRAMADOL HCL 50 MG PO TABS: 50.0000 mg | ORAL_TABLET | Freq: Two times a day (BID) | ORAL | Status: DC

## 2014-06-30 ENCOUNTER — Other Ambulatory Visit: Payer: Self-pay | Admitting: Nurse Practitioner

## 2014-07-03 ENCOUNTER — Other Ambulatory Visit: Payer: Self-pay | Admitting: Nurse Practitioner

## 2014-08-09 ENCOUNTER — Encounter (INDEPENDENT_AMBULATORY_CARE_PROVIDER_SITE_OTHER): Payer: Self-pay

## 2014-08-09 ENCOUNTER — Encounter: Payer: Self-pay | Admitting: Family Medicine

## 2014-08-09 ENCOUNTER — Ambulatory Visit (INDEPENDENT_AMBULATORY_CARE_PROVIDER_SITE_OTHER): Payer: 59 | Admitting: Family Medicine

## 2014-08-09 VITALS — BP 135/77 | HR 75 | Temp 97.9°F | Ht 62.0 in | Wt 200.0 lb

## 2014-08-09 DIAGNOSIS — R609 Edema, unspecified: Secondary | ICD-10-CM

## 2014-08-09 MED ORDER — CYCLOBENZAPRINE HCL 10 MG PO TABS: 10.0000 mg | ORAL_TABLET | Freq: Three times a day (TID) | ORAL | Status: DC | PRN

## 2014-08-09 MED ORDER — HYDROCODONE-ACETAMINOPHEN 5-325 MG PO TABS
1.0000 | ORAL_TABLET | Freq: Three times a day (TID) | ORAL | Status: DC
Start: 2014-08-09 — End: 2014-11-16

## 2014-08-09 MED ORDER — TRIAMTERENE-HCTZ 37.5-25 MG PO TABS: 1.0000 | ORAL_TABLET | Freq: Every day | ORAL | Status: DC

## 2014-08-09 NOTE — Progress Notes (Signed)
Subjective:  Patient ID: Stacie Solis, female    DOB: 1960/09/15  Age: 54 y.o. MRN: [id number]  CC: Foot Swelling   HPI Stacie Solis presents for left foot X 2 weeks. Right X 1 week. No pain. First occurrence. Denies any shortness of breath. No history of renal insufficiency or liver disease. No increased use of alcohol. No change in medications recently. The swelling is painless it is limited to the feet not the legs. There has been no redness to the legs. Swelling has been mild wax and wanes through the day she says that when she sits it seems to get worse. However when she stands it gets bad 2. She has a hard time elevating her feet as necessary to the level of her heart due to history of back pain.  History Stacie Solis has a past medical history of GERD (gastroesophageal reflux disease); Anxiety; Allergy; and Hyperlipidemia.   She has past surgical history that includes Cholecystectomy and Brain surgery.   Her family history includes Arthritis in her mother; Diabetes in her mother; Hypertension in her mother.She reports that she has been smoking Cigarettes.  She has been smoking about 2.00 packs per day. She does not have any smokeless tobacco history on file. She reports that she does not drink alcohol or use illicit drugs.  Outpatient Prescriptions Prior to Visit  Medication Sig Dispense Refill  . ALPRAZolam (XANAX) 0.5 MG tablet Take 1 tablet (0.5 mg total) by mouth at bedtime as needed for anxiety. 30 tablet 1  . atorvastatin (LIPITOR) 40 MG tablet TAKE 1 TABLET (40 MG TOTAL) BY MOUTH DAILY. 90 tablet 1  . cholecalciferol (VITAMIN D) 1000 UNITS tablet Take 1,000 Units by mouth daily.    . Choline Fenofibrate (FENOFIBRIC ACID) 135 MG CPDR Take 1 capsule by mouth daily. 90 capsule 1  . escitalopram (LEXAPRO) 10 MG tablet TAKE 1 TABLET (10 MG TOTAL) BY MOUTH DAILY. 90 tablet 1  . fish oil-omega-3 fatty acids 1000 MG capsule Take 2 g by mouth 2 (two) times daily.    Marland Kitchen omeprazole (PRILOSEC) 40  MG capsule TAKE 1 CAPSULE (40 MG TOTAL) BY MOUTH DAILY. 90 capsule 1  . pramipexole (MIRAPEX) 0.5 MG tablet Take 1 tablet (0.5 mg total) by mouth 2 (two) times daily. 180 tablet 0  . traZODone (DESYREL) 50 MG tablet TAKE 0.5-1 TABLETS (25-50 MG TOTAL) BY MOUTH AT BEDTIME AS NEEDED FOR SLEEP. 90 tablet 1  . triamcinolone (NASACORT) 55 MCG/ACT AERO nasal inhaler PLACE 2 SPRAYS INTO THE NOSE DAILY. 49.5 Inhaler 2  . traMADol (ULTRAM) 50 MG tablet Take 1 tablet (50 mg total) by mouth 2 (two) times daily. (Patient not taking: Reported on 08/09/2014) 60 tablet 0  . zolpidem (AMBIEN) 10 MG tablet Take 1 tablet (10 mg total) by mouth at bedtime as needed for sleep. 30 tablet 1  . atorvastatin (LIPITOR) 40 MG tablet TAKE 1 TABLET (40 MG TOTAL) BY MOUTH DAILY. (Patient not taking: Reported on 08/09/2014) 90 tablet 0  . cyclobenzaprine (FLEXERIL) 10 MG tablet TAKE 1 TABLET (10 MG TOTAL) BY MOUTH 3 (THREE) TIMES DAILY AS NEEDED FOR MUSCLE SPASMS. (Patient not taking: Reported on 08/09/2014) 30 tablet 0  . escitalopram (LEXAPRO) 10 MG tablet TAKE 1 TABLET (10 MG TOTAL) BY MOUTH DAILY. (Patient not taking: Reported on 08/09/2014) 90 tablet 0  . meloxicam (MOBIC) 15 MG tablet TAKE 1 TABLET (15 MG TOTAL) BY MOUTH DAILY. (Patient not taking: Reported on 08/09/2014) 30 tablet 0  No facility-administered medications prior to visit.    ROS Review of Systems  Constitutional: Negative for fever, chills, diaphoresis, appetite change, fatigue and unexpected weight change.  HENT: Negative for congestion, ear pain, hearing loss, postnasal drip, rhinorrhea, sneezing, sore throat and trouble swallowing.   Eyes: Negative for pain.  Respiratory: Negative for cough, chest tightness and shortness of breath.   Cardiovascular: Positive for leg swelling. Negative for chest pain and palpitations.  Gastrointestinal: Negative for nausea, vomiting, abdominal pain, diarrhea and constipation.  Genitourinary: Negative for dysuria,  frequency and menstrual problem.  Musculoskeletal: Negative for joint swelling and arthralgias.  Skin: Negative for rash.  Neurological: Negative for dizziness, weakness, numbness and headaches.  Psychiatric/Behavioral: Negative for dysphoric mood and agitation.    Objective:  BP 135/77 mmHg  Pulse 75  Temp(Src) 97.9 F (36.6 C) (Oral)  Ht '5\' 2"'  (1.575 m)  Wt 200 lb (90.719 kg)  BMI 36.57 kg/m2  BP Readings from Last 3 Encounters:  08/09/14 135/77  06/08/14 132/80  05/31/14 137/80    Wt Readings from Last 3 Encounters:  08/09/14 200 lb (90.719 kg)  06/08/14 196 lb (88.905 kg)  05/31/14 196 lb (88.905 kg)     Physical Exam  Constitutional: She is oriented to person, place, and time. She appears well-developed and well-nourished. No distress.  HENT:  Head: Normocephalic and atraumatic.  Right Ear: External ear normal.  Left Ear: External ear normal.  Nose: Nose normal.  Mouth/Throat: Oropharynx is clear and moist.  Eyes: Conjunctivae and EOM are normal. Pupils are equal, round, and reactive to light.  Neck: Normal range of motion. Neck supple. No thyromegaly present.  Cardiovascular: Normal rate, regular rhythm and normal heart sounds.   No murmur heard. Pulmonary/Chest: Effort normal and breath sounds normal. No respiratory distress. She has no wheezes. She has no rales.  Abdominal: Soft. Bowel sounds are normal. She exhibits no distension. There is no tenderness.  Lymphadenopathy:    She has no cervical adenopathy.  Neurological: She is alert and oriented to person, place, and time. She has normal reflexes.  Skin: Skin is warm and dry.  Psychiatric: She has a normal mood and affect. Her behavior is normal. Judgment and thought content normal.    No results found for: HGBA1C  Lab Results  Component Value Date   WBC 9.9 06/18/2014   HGB 14.4 06/18/2014   HCT 45.3 06/18/2014   PLT 263 06/08/2014   GLUCOSE 93 06/08/2014   CHOL 124 06/08/2014   TRIG 83  06/08/2014   HDL 39* 06/08/2014   LDLCALC 53 01/05/2013   ALT 27 06/08/2014   AST 21 06/08/2014   NA 138 06/08/2014   K 4.3 06/08/2014   CL 100 06/08/2014   CREATININE 1.20* 06/08/2014   BUN 13 06/08/2014   CO2 23 06/08/2014   TSH 2.380 06/08/2014    US Breast Left  12/06/2000   FINDINGS MAMMOGRAM, DIAGNOSTIC BILATERAL AND ULTRASOUND BREAST, LEFT: ABNORMALITY RECENTLY PALPATED WITHIN THE LEFT BREAST. ON PHYSICAL EXAM, PATIENT IS NOT ABLE TO IDENTIFY THE SITE TODAY BUT KNOWS THAT IT WAS IN THE LATERAL ASPECT.  PREVIOUS EXAMS ARE NOT AVAILABLE FOR COMPARISON. BREAST PARENCHYMA IS DENSE FIBROGLANDULAR.  THERE ARE NO MASSES, ARCHITECTURAL DISTORTION OR SUSPICIOUS MICROCALCIFICATIONS TO SUGGEST THE PRESENCE OF MALIGNANCY. ON PHYSICAL EXAM, THERE IS NO DISCRETE MASS BUT THERE IS THICKENING IN THE 2 O'CLOCK POSITION, APPROXIMATELY 7 CM FROM THE NIPPLE.  ULTRASOUND IS PERFORMED OF THE LATERAL ASPECT OF THE LEFT BREAST, SHOWING NORMAL APPEARING  AND DENSE FIBROGLANDULAR TISSUE.  NO MASSES OR AREAS OF SHADOWING ARE IDENTIFIED. IMPRESSION 1.  NO MAMMOGRAPHIC OR ULTRASONOGRAPHIC EVIDENCE FOR MALIGNANCY.  ANNUAL MAMMOGRAPHY IS RECOMMENDED. ASSESSMENT: BIRADS 1:  NEGATIVE.  Mm Digital Diagnostic Bilateral  12/06/2000   FINDINGS MAMMOGRAM, DIAGNOSTIC BILATERAL AND ULTRASOUND BREAST, LEFT: ABNORMALITY RECENTLY PALPATED WITHIN THE LEFT BREAST. ON PHYSICAL EXAM, PATIENT IS NOT ABLE TO IDENTIFY THE SITE TODAY BUT KNOWS THAT IT WAS IN THE LATERAL ASPECT.  PREVIOUS EXAMS ARE NOT AVAILABLE FOR COMPARISON. BREAST PARENCHYMA IS DENSE FIBROGLANDULAR.  THERE ARE NO MASSES, ARCHITECTURAL DISTORTION OR SUSPICIOUS MICROCALCIFICATIONS TO SUGGEST THE PRESENCE OF MALIGNANCY. ON PHYSICAL EXAM, THERE IS NO DISCRETE MASS BUT THERE IS THICKENING IN THE 2 O'CLOCK POSITION, APPROXIMATELY 7 CM FROM THE NIPPLE.  ULTRASOUND IS PERFORMED OF THE LATERAL ASPECT OF THE LEFT BREAST, SHOWING NORMAL APPEARING AND DENSE FIBROGLANDULAR TISSUE.   NO MASSES OR AREAS OF SHADOWING ARE IDENTIFIED. IMPRESSION 1.  NO MAMMOGRAPHIC OR ULTRASONOGRAPHIC EVIDENCE FOR MALIGNANCY.  ANNUAL MAMMOGRAPHY IS RECOMMENDED. ASSESSMENT: BIRADS 1:  NEGATIVE.   Assessment & Plan:   Stacie Solis was seen today for foot swelling.  Diagnoses and all orders for this visit:  Edema Orders: -     CMP14+EGFR -     D-dimer, quantitative (not at [facility]) -     Brain natriuretic peptide  Other orders -     triamterene-hydrochlorothiazide (MAXZIDE-25) 37.5-25 MG per tablet; Take 1 tablet by mouth daily. -     HYDROcodone-acetaminophen (NORCO) 5-325 MG per tablet; Take 1 tablet by mouth 3 (three) times daily after meals. -     cyclobenzaprine (FLEXERIL) 10 MG tablet; Take 1 tablet (10 mg total) by mouth 3 (three) times daily as needed for muscle spasms.  I have discontinued Stacie Solis's meloxicam. I am also having her start on triamterene-hydrochlorothiazide and HYDROcodone-acetaminophen. Additionally, I am having her maintain her fish oil-omega-3 fatty acids, cholecalciferol, triamcinolone, atorvastatin, Fenofibric Acid, escitalopram, omeprazole, traZODone, pramipexole, zolpidem, ALPRAZolam, traMADol, HYDROcodone-acetaminophen, and cyclobenzaprine.  Meds ordered this encounter  Medications  . HYDROcodone-acetaminophen (NORCO/VICODIN) 5-325 MG per tablet    Sig: TAKE 1 TABLET TWICE A DAY AS NEEDED FOR SEVERE PAIN    Refill:  0  . triamterene-hydrochlorothiazide (MAXZIDE-25) 37.5-25 MG per tablet    Sig: Take 1 tablet by mouth daily.    Dispense:  90 tablet    Refill:  3  . HYDROcodone-acetaminophen (NORCO) 5-325 MG per tablet    Sig: Take 1 tablet by mouth 3 (three) times daily after meals.    Dispense:  45 tablet    Refill:  0  . cyclobenzaprine (FLEXERIL) 10 MG tablet    Sig: Take 1 tablet (10 mg total) by mouth 3 (three) times daily as needed for muscle spasms.    Dispense:  90 tablet    Refill:  2   discussed multiple processes probable etiologies for her swelling  including her weight and the heat of the day. Medication as a possible side effect. Heart renal or hepatic failure as well as deep venous thrombosis. Each is to be evaluated with the tests noted above. Follow-up: Return in about 6 weeks (around 09/20/2014), or if symptoms worsen or fail to improve.  Claretta Fraise, M.D.

## 2014-08-10 LAB — CMP14+EGFR
A/G RATIO: 1.9 (ref 1.1–2.5)
ALBUMIN: 4.5 g/dL (ref 3.5–5.5)
ALT: 25 IU/L (ref 0–32)
AST: 23 IU/L (ref 0–40)
Alkaline Phosphatase: 66 IU/L (ref 39–117)
BUN/Creatinine Ratio: 12 (ref 9–23)
BUN: 11 mg/dL (ref 6–24)
Bilirubin Total: 0.3 mg/dL (ref 0.0–1.2)
CO2: 23 mmol/L (ref 18–29)
Calcium: 10 mg/dL (ref 8.7–10.2)
Chloride: 102 mmol/L (ref 97–108)
Creatinine, Ser: 0.89 mg/dL (ref 0.57–1.00)
GFR, EST AFRICAN AMERICAN: 84 mL/min/{1.73_m2} (ref >59)
GFR, EST NON AFRICAN AMERICAN: 73 mL/min/{1.73_m2} (ref >59)
GLOBULIN, TOTAL: 2.4 g/dL (ref 1.5–4.5)
GLUCOSE: 92 mg/dL (ref 65–99)
Potassium: 4.6 mmol/L (ref 3.5–5.2)
Sodium: 142 mmol/L (ref 134–144)
TOTAL PROTEIN: 6.9 g/dL (ref 6.0–8.5)

## 2014-08-10 LAB — D-DIMER, QUANTITATIVE: D-DIMER: 0.33 mg/L FEU (ref 0.00–0.49)

## 2014-08-10 LAB — BRAIN NATRIURETIC PEPTIDE: BNP: 18.8 pg/mL (ref 0.0–100.0)

## 2014-08-11 ENCOUNTER — Telehealth: Payer: Self-pay | Admitting: *Deleted

## 2014-08-11 NOTE — Telephone Encounter (Signed)
-----   Message from Claretta Fraise, MD sent at 08/11/2014  1:10 PM EDT ----- Cyndia Diver,    Your lab result is normal.Some minor variations that are not significant are commonly marked abnormal, but do not represent any medical problem for you.  Best regards, Claretta Fraise, M.D.

## 2014-08-11 NOTE — Telephone Encounter (Signed)
Pt notified of results

## 2014-08-24 ENCOUNTER — Other Ambulatory Visit: Payer: Self-pay | Admitting: Family Medicine

## 2014-08-31 ENCOUNTER — Other Ambulatory Visit: Payer: Self-pay | Admitting: Family Medicine

## 2014-08-31 DIAGNOSIS — G8929 Other chronic pain: Secondary | ICD-10-CM

## 2014-08-31 DIAGNOSIS — M545 Low back pain: Principal | ICD-10-CM

## 2014-08-31 MED ORDER — TRAMADOL HCL 50 MG PO TABS: 50.0000 mg | ORAL_TABLET | Freq: Two times a day (BID) | ORAL | Status: DC

## 2014-08-31 NOTE — Telephone Encounter (Signed)
I cannot refill the hydrocodone. But I did send in a refill of tramadol if desired.

## 2014-08-31 NOTE — Telephone Encounter (Signed)
Patient notified that tramadol script can be picked up at front desk

## 2014-09-27 ENCOUNTER — Other Ambulatory Visit: Payer: Self-pay | Admitting: Nurse Practitioner

## 2014-09-27 NOTE — Telephone Encounter (Signed)
Last seen 08/09/14  Dr Livia Snellen  Requesting 90 day supply

## 2014-11-03 ENCOUNTER — Telehealth: Payer: Self-pay

## 2014-11-14 ENCOUNTER — Emergency Department (INDEPENDENT_AMBULATORY_CARE_PROVIDER_SITE_OTHER)
Admission: EM | Admit: 2014-11-14 | Discharge: 2014-11-14 | Disposition: A | Discharge status: Home / Self Care | Payer: 59 | Source: Home / Self Care

## 2014-11-14 ENCOUNTER — Encounter (HOSPITAL_COMMUNITY): Payer: Self-pay | Admitting: Emergency Medicine

## 2014-11-14 DIAGNOSIS — K5909 Other constipation: Secondary | ICD-10-CM | POA: Diagnosis not present

## 2014-11-14 DIAGNOSIS — T402X5A Adverse effect of other opioids, initial encounter: Secondary | ICD-10-CM

## 2014-11-14 DIAGNOSIS — K5903 Drug induced constipation: Secondary | ICD-10-CM

## 2014-11-14 MED ORDER — LACTULOSE 10 GM/15ML PO SOLN: 10.0000 g | Freq: Every day | ORAL | Status: DC | PRN

## 2014-11-14 NOTE — ED Notes (Signed)
Patient reports feeling constipated and bloated x 2 weeks. She has tried several OTC remedies with mild relief. She has a bowel movement and still feels bloated. Patient denies any gastrointestinal problems.

## 2014-11-14 NOTE — ED Provider Notes (Signed)
CSN: [id number]     Arrival date & time 11/14/14  1816 History   None    Chief Complaint  Patient presents with  . Constipation  . Bloated   (Consider location/radiation/quality/duration/timing/severity/associated sxs/prior Treatment) Patient is a 54 y.o. female presenting with constipation. The history is provided by the patient. No language interpreter was used.  Constipation Severity:  Moderate Time since last bowel movement:  5 days Timing:  Constant Progression:  Worsening Chronicity:  New Context: narcotics   Context: not dehydration   Stool description: Normal bowel movement was about 2 wks ago, but  in betwen she will move her bowel till 4 days ago when she was unable to move her bowel. Stool is soft whenever she moves her bowel. Unusual stool frequency:  2-3 times per week Relieved by:  Nothing (enema) Worsened by:  Narcotic pain medications Ineffective treatments: Dulcolax, Miralax 1 cup twice a day. Associated symptoms: no abdominal pain, no anorexia, no fever, no flatus, no nausea, no urinary retention and no vomiting   Associated symptoms comment:  Bloated Risk factors: no recent antibiotic use and no recent surgery     Past Medical History  Diagnosis Date  . GERD (gastroesophageal reflux disease)   . Anxiety   . Allergy   . Hyperlipidemia    Past Surgical History  Procedure Laterality Date  . Cholecystectomy    . Brain surgery      trig neuralgia   Family History  Problem Relation Age of Onset  . Hypertension Mother   . Diabetes Mother   . Arthritis Mother    Social History  Substance Use Topics  . Smoking status: Current Every Day Smoker -- 2.00 packs/day    Types: Cigarettes  . Smokeless tobacco: None  . Alcohol Use: No   OB History    No data available     Review of Systems  Constitutional: Negative for fever.  Respiratory: Negative.   Cardiovascular: Negative.   Gastrointestinal: Positive for constipation. Negative for nausea, vomiting,  abdominal pain, blood in stool, anorexia and flatus.  Genitourinary: Negative.   All other systems reviewed and are negative.   Allergies  Review of patient's allergies indicates no known allergies.  Home Medications   Prior to Admission medications   Medication Sig Start Date End Date Taking? Authorizing Provider  ALPRAZolam Duanne Moron) 0.5 MG tablet Take 1 tablet (0.5 mg total) by mouth at bedtime as needed for anxiety. 06/18/14   [name], FNP  atorvastatin (LIPITOR) 40 MG tablet TAKE 1 TABLET (40 MG TOTAL) BY MOUTH DAILY. 03/03/14   [name], FNP  atorvastatin (LIPITOR) 40 MG tablet TAKE 1 TABLET (40 MG TOTAL) BY MOUTH DAILY. 09/27/14   Claretta Fraise, MD  cholecalciferol (VITAMIN D) 1000 UNITS tablet Take 1,000 Units by mouth daily.    Historical Provider, MD  Choline Fenofibrate (FENOFIBRIC ACID) 135 MG CPDR TAKE 1 CAPSULE BY MOUTH DAILY. 09/27/14   Claretta Fraise, MD  cyclobenzaprine (FLEXERIL) 10 MG tablet Take 1 tablet (10 mg total) by mouth 3 (three) times daily as needed for muscle spasms. 08/09/14   Claretta Fraise, MD  escitalopram (LEXAPRO) 10 MG tablet TAKE 1 TABLET (10 MG TOTAL) BY MOUTH DAILY. 03/03/14   [name], FNP  escitalopram (LEXAPRO) 10 MG tablet TAKE 1 TABLET (10 MG TOTAL) BY MOUTH DAILY. 09/27/14   Claretta Fraise, MD  fish oil-omega-3 fatty acids 1000 MG capsule Take 2 g by mouth 2 (two) times daily.    Historical Provider, MD  HYDROcodone-acetaminophen (NORCO) 5-325 MG per tablet Take 1 tablet by mouth 3 (three) times daily after meals. 08/09/14   Claretta Fraise, MD  HYDROcodone-acetaminophen (NORCO/VICODIN) 5-325 MG per tablet TAKE 1 TABLET TWICE A DAY AS NEEDED FOR SEVERE PAIN 07/15/14   Historical Provider, MD  omeprazole (PRILOSEC) 40 MG capsule TAKE 1 CAPSULE (40 MG TOTAL) BY MOUTH DAILY. 03/03/14   [name], FNP  pramipexole (MIRAPEX) 0.5 MG tablet TAKE 1 TABLET (0.5 MG TOTAL) BY MOUTH 2 (TWO) TIMES DAILY. 08/24/14   Claretta Fraise, MD   traMADol (ULTRAM) 50 MG tablet Take 1 tablet (50 mg total) by mouth 2 (two) times daily. 08/31/14   Claretta Fraise, MD  traZODone (DESYREL) 50 MG tablet TAKE 1/2-1 TABLETS BY MOUTH AT BEDTIME AS NEEDED FOR SLEEP 09/27/14   Claretta Fraise, MD  triamcinolone (NASACORT) 55 MCG/ACT AERO nasal inhaler PLACE 2 SPRAYS INTO THE NOSE DAILY. 02/24/14   Chipper Herb, MD  triamterene-hydrochlorothiazide (MAXZIDE-25) 37.5-25 MG per tablet Take 1 tablet by mouth daily. 08/09/14   Claretta Fraise, MD  zolpidem (AMBIEN) 10 MG tablet Take 1 tablet (10 mg total) by mouth at bedtime as needed for sleep. 06/08/14 07/08/14  [name], FNP   Meds Ordered and Administered this Visit  Medications - No data to display  BP 127/62 mmHg  Pulse 109  Temp(Src) 99.6 F (37.6 C) (Oral)  Resp 16  SpO2 97% No data found.   Physical Exam  Constitutional: She appears well-developed. No distress.  Cardiovascular: Normal rate, regular rhythm, normal heart sounds and intact distal pulses.   No murmur heard. Pulmonary/Chest: Effort normal and breath sounds normal. No respiratory distress. She has no wheezes.  Abdominal: Soft. Bowel sounds are normal. She exhibits no distension and no mass. There is no tenderness. There is no rebound and no guarding.  Musculoskeletal: Normal range of motion.  Nursing note and vitals reviewed.   ED Course  Procedures (including critical care time)  Labs Review Labs Reviewed - No data to display  Imaging Review No results found.   Visual Acuity Review  Right Eye Distance:   Left Eye Distance:   Bilateral Distance:    Right Eye Near:   Left Eye Near:    Bilateral Near:         MDM  No diagnosis found. Opioid induced constipation.  She had tried Miralax and Colace with no improvement. I recommended trial of Senna. If no improvement with Senna she may use lactulose. Prescription given. I warned her she might get diarrhea with Lactulose. To stop med if having watery  stool. She agreed with plan.   Kinnie Feil, MD 11/14/14 2020

## 2014-11-14 NOTE — Discharge Instructions (Signed)
It was nice seeing you today, it seems you are having narcotic induced constipation. Please try Senna which you can get over the counter in addition to Miralax. If this does not help you may use Lactulose. See your PCP soon for follow up.  Constipation Constipation is when a person:  Poops (has a bowel movement) less than 3 times a week.  Has a hard time pooping.  Has poop that is dry, hard, or bigger than normal. HOME CARE   Eat foods with a lot of fiber in them. This includes fruits, vegetables, beans, and whole grains such as brown rice.  Avoid fatty foods and foods with a lot of sugar. This includes french fries, hamburgers, cookies, candy, and soda.  If you are not getting enough fiber from food, take products with added fiber in them (supplements).  Drink enough fluid to keep your pee (urine) clear or pale yellow.  Exercise on a regular basis, or as told by your doctor.  Go to the restroom when you feel like you need to poop. Do not hold it.  Only take medicine as told by your doctor. Do not take medicines that help you poop (laxatives) without talking to your doctor first. GET HELP RIGHT AWAY IF:   You have bright red blood in your poop (stool).  Your constipation lasts more than 4 days or gets worse.  You have belly (abdominal) or butt (rectal) pain.  You have thin poop (as thin as a pencil).  You lose weight, and it cannot be explained. MAKE SURE YOU:   Understand these instructions.  Will watch your condition.  Will get help right away if you are not doing well or get worse. Document Released: 08/01/2007 Document Revised: 02/17/2013 Document Reviewed: 11/24/2012 [facility] Patient Information 2015 [place], [place]. This information is not intended to replace advice given to you by your health care provider. Make sure you discuss any questions you have with your health care provider.

## 2014-11-15 ENCOUNTER — Telehealth: Payer: Self-pay | Admitting: Nurse Practitioner

## 2014-11-15 NOTE — Telephone Encounter (Signed)
Constipation x 2 weeks. Tried Miralax twice daily for 1 week. She has also tried MOM, dulcolax pill and suppository, saline enema, and sorbitol.  Patient was evaluated at urgent care last night and still no results.  She has also tried to increase her intake of warm liquids. Also complains of swelling in her feet.  She wanted an appointment with Stacie Solis. I don't have anything available with her until 11/18/14. There are actually no appointments with any provider left for today. Appt scheduled for tomorrow morning with Dr Warrick Parisian. Patient wanted to me to see if Stacie Solis had any other suggestions or if she would be able to work her in today. I will forward to her for review and recommendations.

## 2014-11-15 NOTE — Telephone Encounter (Signed)
Call patient family said she went to urgent care yesterday

## 2014-11-16 ENCOUNTER — Ambulatory Visit (INDEPENDENT_AMBULATORY_CARE_PROVIDER_SITE_OTHER): Payer: 59 | Admitting: Family Medicine

## 2014-11-16 ENCOUNTER — Ambulatory Visit (INDEPENDENT_AMBULATORY_CARE_PROVIDER_SITE_OTHER): Payer: 59

## 2014-11-16 ENCOUNTER — Encounter: Payer: Self-pay | Admitting: Family Medicine

## 2014-11-16 VITALS — BP 130/82 | HR 92 | Temp 97.6°F | Ht 62.5 in | Wt 207.6 lb

## 2014-11-16 DIAGNOSIS — K5900 Constipation, unspecified: Secondary | ICD-10-CM

## 2014-11-16 DIAGNOSIS — M7989 Other specified soft tissue disorders: Secondary | ICD-10-CM

## 2014-11-16 MED ORDER — HYDROCODONE-ACETAMINOPHEN 5-325 MG PO TABS: 1.0000 | ORAL_TABLET | Freq: Two times a day (BID) | ORAL | Status: DC

## 2014-11-16 MED ORDER — FUROSEMIDE 20 MG PO TABS: 20.0000 mg | ORAL_TABLET | ORAL | Status: DC | PRN

## 2014-11-16 MED ORDER — SODIUM POLYSTYRENE SULFONATE 50 GM/200ML RE SUSP: 50.0000 g | Freq: Once | RECTAL | Status: DC

## 2014-11-16 NOTE — Progress Notes (Signed)
BP 130/82 mmHg  Pulse 92  Temp(Src) 97.6 F (36.4 C) (Oral)  Ht 5' 2.5" (1.588 m)  Wt 207 lb 9.6 oz (94.167 kg)  BMI 37.34 kg/m2   Subjective:    Patient ID: Stacie Solis, female    DOB: [birth year], 54 y.o.   MRN: [id number]  HPI: Stacie Solis is a 54 y.o. female presenting on 11/16/2014 for Edema and Constipation   HPI Constipation Patient has had about 2-3 weeks of constipation. She describes constipation as having feelings of needing to strain and having multiple small bowel movements before she has good sized 1. She still has a good-sized bowel movement about every other day but only has one or 2 small bowel movements on the other days. She feels like she has a lot of bloating and distention but denies any nausea or vomiting. She is still passing flatus. She does take hydrocodone. At home she tried MiraLAX and Dulcolax for couple days and then tried one dose of milk of magnesia. These have not been successful and she would like to try something further. She was also given a prescription for lactulose which didn't seem to help either.  Leg swelling Patient is also been having leg swelling off and on for the past few months and was started on a blood pressure medication/diuretic of hydrochlorothiazide which has not seemed to help much. Her blood pressure is better though. Leg swelling is bilateral lower extremity without redness or warmth.  Relevant past medical, surgical, family and social history reviewed and updated as indicated. Interim medical history since our last visit reviewed. Allergies and medications reviewed and updated.  Review of Systems  Constitutional: Negative for fever and chills.  HENT: Negative for congestion, ear discharge and ear pain.   Eyes: Negative for redness and visual disturbance.  Respiratory: Negative for chest tightness and shortness of breath.   Cardiovascular: Positive for leg swelling. Negative for chest pain.  Gastrointestinal: Positive for  abdominal pain, constipation and abdominal distention. Negative for nausea, vomiting and diarrhea.  Genitourinary: Negative for dysuria and difficulty urinating.  Musculoskeletal: Negative for back pain and gait problem.  Skin: Negative for rash.  Neurological: Negative for dizziness, light-headedness and headaches.  Psychiatric/Behavioral: Negative for behavioral problems and agitation.  All other systems reviewed and are negative.   Per HPI unless specifically indicated above     Medication List       This list is accurate as of: 11/16/14  9:51 AM.  Always use your most recent med list.               ALPRAZolam 0.5 MG tablet  Commonly known as:  XANAX  Take 1 tablet (0.5 mg total) by mouth at bedtime as needed for anxiety.     atorvastatin 40 MG tablet  Commonly known as:  LIPITOR  TAKE 1 TABLET (40 MG TOTAL) BY MOUTH DAILY.     cholecalciferol 1000 UNITS tablet  Commonly known as:  VITAMIN D  Take 1,000 Units by mouth daily.     cyclobenzaprine 10 MG tablet  Commonly known as:  FLEXERIL  Take 1 tablet (10 mg total) by mouth 3 (three) times daily as needed for muscle spasms.     escitalopram 10 MG tablet  Commonly known as:  LEXAPRO  TAKE 1 TABLET (10 MG TOTAL) BY MOUTH DAILY.     escitalopram 10 MG tablet  Commonly known as:  LEXAPRO  TAKE 1 TABLET (10 MG TOTAL) BY MOUTH DAILY.  Fenofibric Acid 135 MG Cpdr  TAKE 1 CAPSULE BY MOUTH DAILY.     fish oil-omega-3 fatty acids 1000 MG capsule  Take 2 g by mouth 2 (two) times daily.     HYDROcodone-acetaminophen 5-325 MG per tablet  Commonly known as:  NORCO/VICODIN  TAKE 1 TABLET TWICE A DAY AS NEEDED FOR SEVERE PAIN     HYDROcodone-acetaminophen 5-325 MG per tablet  Commonly known as:  NORCO  Take 1 tablet by mouth 3 (three) times daily after meals.     lactulose 10 GM/15ML solution  Commonly known as:  CHRONULAC  Take 15 mLs (10 g total) by mouth daily as needed for severe constipation.     omeprazole  40 MG capsule  Commonly known as:  PRILOSEC  TAKE 1 CAPSULE (40 MG TOTAL) BY MOUTH DAILY.     pramipexole 0.5 MG tablet  Commonly known as:  MIRAPEX  TAKE 1 TABLET (0.5 MG TOTAL) BY MOUTH 2 (TWO) TIMES DAILY.     sodium polystyrene sulfonate 50 GM/200ML enema  Place 200 mLs (50 g total) rectally once.     traMADol 50 MG tablet  Commonly known as:  ULTRAM  Take 1 tablet (50 mg total) by mouth 2 (two) times daily.     traZODone 50 MG tablet  Commonly known as:  DESYREL  TAKE 1/2-1 TABLETS BY MOUTH AT BEDTIME AS NEEDED FOR SLEEP     triamcinolone 55 MCG/ACT Aero nasal inhaler  Commonly known as:  NASACORT  PLACE 2 SPRAYS INTO THE NOSE DAILY.     triamterene-hydrochlorothiazide 37.5-25 MG per tablet  Commonly known as:  MAXZIDE-25  Take 1 tablet by mouth daily.           Objective:    BP 130/82 mmHg  Pulse 92  Temp(Src) 97.6 F (36.4 C) (Oral)  Ht 5' 2.5" (1.588 m)  Wt 207 lb 9.6 oz (94.167 kg)  BMI 37.34 kg/m2  Wt Readings from Last 3 Encounters:  11/16/14 207 lb 9.6 oz (94.167 kg)  08/09/14 200 lb (90.719 kg)  06/08/14 196 lb (88.905 kg)    Physical Exam  Constitutional: She is oriented to person, place, and time. She appears well-developed and well-nourished. No distress.  Eyes: Conjunctivae and EOM are normal. Pupils are equal, round, and reactive to light.  Neck: Neck supple. No thyromegaly present.  Cardiovascular: Normal rate, regular rhythm, normal heart sounds and intact distal pulses.   No murmur heard. Pulmonary/Chest: Effort normal and breath sounds normal. No respiratory distress. She has no wheezes.  Abdominal: Soft. Bowel sounds are normal. She exhibits no distension. There is no tenderness. There is no rebound and no guarding.  Musculoskeletal: Normal range of motion. She exhibits tenderness (2+). She exhibits no edema.  Lymphadenopathy:    She has no cervical adenopathy.  Neurological: She is alert and oriented to person, place, and time.  Coordination normal.  Skin: Skin is warm and dry. No rash noted. She is not diaphoretic.  Psychiatric: She has a normal mood and affect. Her behavior is normal.  Vitals reviewed.   Results for orders placed or performed in visit on 08/09/14  CMP14+EGFR  Result Value Ref Range   Glucose 92 65 - 99 mg/dL   BUN 11 6 - 24 mg/dL   Creatinine, Ser 0.89 0.57 - 1.00 mg/dL   GFR calc non Af Amer 73 >59 mL/min/1.73   GFR calc Af Amer 84 >59 mL/min/1.73   BUN/Creatinine Ratio 12 9 - 23   Sodium 142  134 - 144 mmol/L   Potassium 4.6 3.5 - 5.2 mmol/L   Chloride 102 97 - 108 mmol/L   CO2 23 18 - 29 mmol/L   Calcium 10.0 8.7 - 10.2 mg/dL   Total Protein 6.9 6.0 - 8.5 g/dL   Albumin 4.5 3.5 - 5.5 g/dL   Globulin, Total 2.4 1.5 - 4.5 g/dL   Albumin/Globulin Ratio 1.9 1.1 - 2.5   Bilirubin Total 0.3 0.0 - 1.2 mg/dL   Alkaline Phosphatase 66 39 - 117 IU/L   AST 23 0 - 40 IU/L   ALT 25 0 - 32 IU/L  D-dimer, quantitative (not at [facility])  Result Value Ref Range   D-DIMER 0.33 0.00 - 0.49 mg/L FEU  Brain natriuretic peptide  Result Value Ref Range   BNP 18.8 0.0 - 100.0 pg/mL      Assessment & Plan:   Problem List Items Addressed This Visit    None    Visit Diagnoses    Constipation, unspecified constipation type    -  Primary    Patient having constipation has tried MiraLAX and Dulcolax. She also has incomplete emptying sensation. We will send for colonoscopy because she has never had 1    Relevant Medications    sodium polystyrene sulfonate 50 GM/200ML enema    Other Relevant Orders    DG Abd 1 View (Completed)    Ambulatory referral to Gastroenterology    Leg swelling        Relevant Medications    furosemide (LASIX) 20 MG tablet        Follow up plan: Return in about 3 months (around 02/15/2015), or if symptoms worsen or fail to improve.  Caryl Pina, MD [facility] 11/16/2014, 9:51 AM

## 2014-11-16 NOTE — Patient Instructions (Signed)

## 2014-11-17 ENCOUNTER — Encounter: Payer: Self-pay | Admitting: Gastroenterology

## 2014-11-21 ENCOUNTER — Other Ambulatory Visit: Payer: Self-pay | Admitting: Nurse Practitioner

## 2014-11-23 ENCOUNTER — Other Ambulatory Visit: Payer: Self-pay | Admitting: Nurse Practitioner

## 2014-11-24 ENCOUNTER — Emergency Department (HOSPITAL_BASED_OUTPATIENT_CLINIC_OR_DEPARTMENT_OTHER)
Admission: EM | Admit: 2014-11-24 | Discharge: 2014-11-24 | Disposition: A | Discharge status: Home / Self Care | Payer: 59 | Attending: Emergency Medicine | Admitting: Emergency Medicine

## 2014-11-24 ENCOUNTER — Ambulatory Visit: Payer: 59 | Admitting: Physician Assistant

## 2014-11-24 ENCOUNTER — Emergency Department (HOSPITAL_BASED_OUTPATIENT_CLINIC_OR_DEPARTMENT_OTHER): Payer: 59

## 2014-11-24 ENCOUNTER — Encounter (HOSPITAL_BASED_OUTPATIENT_CLINIC_OR_DEPARTMENT_OTHER): Payer: Self-pay

## 2014-11-24 DIAGNOSIS — G8929 Other chronic pain: Secondary | ICD-10-CM | POA: Diagnosis not present

## 2014-11-24 DIAGNOSIS — I878 Other specified disorders of veins: Secondary | ICD-10-CM

## 2014-11-24 DIAGNOSIS — Z3202 Encounter for pregnancy test, result negative: Secondary | ICD-10-CM | POA: Insufficient documentation

## 2014-11-24 DIAGNOSIS — R609 Edema, unspecified: Secondary | ICD-10-CM | POA: Diagnosis not present

## 2014-11-24 DIAGNOSIS — R2243 Localized swelling, mass and lump, lower limb, bilateral: Secondary | ICD-10-CM | POA: Diagnosis present

## 2014-11-24 DIAGNOSIS — R600 Localized edema: Secondary | ICD-10-CM

## 2014-11-24 DIAGNOSIS — Z79899 Other long term (current) drug therapy: Secondary | ICD-10-CM | POA: Insufficient documentation

## 2014-11-24 DIAGNOSIS — K219 Gastro-esophageal reflux disease without esophagitis: Secondary | ICD-10-CM | POA: Diagnosis not present

## 2014-11-24 DIAGNOSIS — F419 Anxiety disorder, unspecified: Secondary | ICD-10-CM | POA: Insufficient documentation

## 2014-11-24 DIAGNOSIS — I879 Disorder of vein, unspecified: Secondary | ICD-10-CM | POA: Insufficient documentation

## 2014-11-24 DIAGNOSIS — E785 Hyperlipidemia, unspecified: Secondary | ICD-10-CM | POA: Diagnosis not present

## 2014-11-24 DIAGNOSIS — R921 Mammographic calcification found on diagnostic imaging of breast: Secondary | ICD-10-CM

## 2014-11-24 HISTORY — DX: Dorsalgia, unspecified: M54.9

## 2014-11-24 HISTORY — DX: Other chronic pain: G89.29

## 2014-11-24 LAB — CBC WITH DIFFERENTIAL/PLATELET
BASOS PCT: 0 %
Basophils Absolute: 0 10*3/uL (ref 0.0–0.1)
Eosinophils Absolute: 0.2 10*3/uL (ref 0.0–0.7)
Eosinophils Relative: 2 %
HEMATOCRIT: 44.4 % (ref 36.0–46.0)
HEMOGLOBIN: 14.5 g/dL (ref 12.0–15.0)
LYMPHS PCT: 33 %
Lymphs Abs: 2.6 10*3/uL (ref 0.7–4.0)
MCH: 31.3 pg (ref 26.0–34.0)
MCHC: 32.7 g/dL (ref 30.0–36.0)
MCV: 95.9 fL (ref 78.0–100.0)
MONOS PCT: 9 %
Monocytes Absolute: 0.7 10*3/uL (ref 0.1–1.0)
NEUTROS ABS: 4.4 10*3/uL (ref 1.7–7.7)
Neutrophils Relative %: 56 %
Platelets: 205 10*3/uL (ref 150–400)
RBC: 4.63 MIL/uL (ref 3.87–5.11)
RDW: 14.3 % (ref 11.5–15.5)
WBC: 7.9 10*3/uL (ref 4.0–10.5)

## 2014-11-24 LAB — URINALYSIS, ROUTINE W REFLEX MICROSCOPIC
BILIRUBIN URINE: NEGATIVE
Glucose, UA: NEGATIVE mg/dL
HGB URINE DIPSTICK: NEGATIVE
Ketones, ur: NEGATIVE mg/dL
Leukocytes, UA: NEGATIVE
NITRITE: NEGATIVE
PH: 7 (ref 5.0–8.0)
Protein, ur: NEGATIVE mg/dL
SPECIFIC GRAVITY, URINE: 1.009 (ref 1.005–1.030)
Urobilinogen, UA: 1 mg/dL (ref 0.0–1.0)

## 2014-11-24 LAB — COMPREHENSIVE METABOLIC PANEL
ALBUMIN: 3.9 g/dL (ref 3.5–5.0)
ALT: 37 U/L (ref 14–54)
AST: 31 U/L (ref 15–41)
Alkaline Phosphatase: 61 U/L (ref 38–126)
Anion gap: 7 (ref 5–15)
BUN: 10 mg/dL (ref 6–20)
CHLORIDE: 106 mmol/L (ref 101–111)
CO2: 28 mmol/L (ref 22–32)
Calcium: 9.4 mg/dL (ref 8.9–10.3)
Creatinine, Ser: 1.09 mg/dL — ABNORMAL HIGH (ref 0.44–1.00)
GFR calc Af Amer: >60 mL/min (ref >60)
GFR calc non Af Amer: 56 mL/min — ABNORMAL LOW (ref >60)
GLUCOSE: 110 mg/dL — AB (ref 65–99)
POTASSIUM: 3.8 mmol/L (ref 3.5–5.1)
SODIUM: 141 mmol/L (ref 135–145)
Total Bilirubin: 0.7 mg/dL (ref 0.3–1.2)
Total Protein: 6.8 g/dL (ref 6.5–8.1)

## 2014-11-24 LAB — BRAIN NATRIURETIC PEPTIDE: B Natriuretic Peptide: 21.4 pg/mL (ref 0.0–100.0)

## 2014-11-24 LAB — PREGNANCY, URINE: Preg Test, Ur: NEGATIVE

## 2014-11-24 NOTE — ED Provider Notes (Signed)
CSN: [id number]     Arrival date & time 11/24/14  1448 History   First MD Initiated Contact with Patient 11/24/14 1503     Chief Complaint  Patient presents with  . Leg Swelling     (Consider location/radiation/quality/duration/timing/severity/associated sxs/prior Treatment) HPI She presents with 3 weeks of lower extremity swelling. She reports that she had been placed on a blood pressure\diarrhetic medication which has subsequently been stopped and transitioned to Lasix daily. She reports she continues to have swelling of her legs and her ankles. She reports it does not improve in the mornings after she has elevated legs overnight. She reports her slight redness associated with them. She also notes that she's had abdominal bloating. No associated abdominal pain. She had assumed that the swelling was due to constipation. She was treated with stool softeners and laxity. She reports she's had good bowel movements with no diminishment in her swelling. She denies any chest pain or shortness of breath. She is a chronic smoker, she reports minimal chronic coughing. She reports just as of yesterday she had 1 episode of vaginal spotting which she has not had for over 6 years since menopause. There was no associated abdominal pain with this. She has otherwise felt well. Past Medical History  Diagnosis Date  . GERD (gastroesophageal reflux disease)   . Anxiety   . Allergy   . Hyperlipidemia   . Chronic back pain    Past Surgical History  Procedure Laterality Date  . Cholecystectomy    . Brain surgery      trig neuralgia   Family History  Problem Relation Age of Onset  . Hypertension Mother   . Diabetes Mother   . Arthritis Mother    Social History  Substance Use Topics  . Smoking status: Current Every Day Smoker -- 2.00 packs/day    Types: Cigarettes  . Smokeless tobacco: None  . Alcohol Use: No   OB History    No data available     Review of Systems 10 Systems reviewed and are  negative for acute change except as noted in the HPI.    Allergies  Review of patient's allergies indicates no known allergies.  Home Medications   Prior to Admission medications   Medication Sig Start Date End Date Taking? Authorizing Provider  Triamcinolone Acetonide (NASACORT AQ NA) Place into the nose.   Yes Historical Provider, MD  ALPRAZolam Duanne Moron) 0.5 MG tablet Take 1 tablet (0.5 mg total) by mouth at bedtime as needed for anxiety. 06/18/14   [name], FNP  atorvastatin (LIPITOR) 40 MG tablet TAKE 1 TABLET (40 MG TOTAL) BY MOUTH DAILY. 03/03/14   [name], FNP  cholecalciferol (VITAMIN D) 1000 UNITS tablet Take 1,000 Units by mouth daily.    Historical Provider, MD  Choline Fenofibrate (FENOFIBRIC ACID) 135 MG CPDR TAKE 1 CAPSULE BY MOUTH DAILY. 09/27/14   Claretta Fraise, MD  cyclobenzaprine (FLEXERIL) 10 MG tablet Take 1 tablet (10 mg total) by mouth 3 (three) times daily as needed for muscle spasms. 08/09/14   Claretta Fraise, MD  escitalopram (LEXAPRO) 10 MG tablet TAKE 1 TABLET (10 MG TOTAL) BY MOUTH DAILY. 03/03/14   [name], FNP  escitalopram (LEXAPRO) 10 MG tablet TAKE 1 TABLET (10 MG TOTAL) BY MOUTH DAILY. 09/27/14   Claretta Fraise, MD  fish oil-omega-3 fatty acids 1000 MG capsule Take 2 g by mouth 2 (two) times daily.    Historical Provider, MD  furosemide (LASIX) 20 MG tablet Take 1 tablet (20  mg total) by mouth as needed for edema (Use up to once a day as needed). 11/16/14   Fransisca Kaufmann Dettinger, MD  HYDROcodone-acetaminophen (NORCO) 5-325 MG per tablet Take 1 tablet by mouth 2 (two) times daily. 11/16/14   Fransisca Kaufmann Dettinger, MD  HYDROcodone-acetaminophen (NORCO/VICODIN) 5-325 MG per tablet TAKE 1 TABLET TWICE A DAY AS NEEDED FOR SEVERE PAIN 07/15/14   Historical Provider, MD  lactulose (CHRONULAC) 10 GM/15ML solution Take 15 mLs (10 g total) by mouth daily as needed for severe constipation. 11/14/14   Kinnie Feil, MD  omeprazole (PRILOSEC) 40 MG  capsule TAKE 1 CAPSULE (40 MG TOTAL) BY MOUTH DAILY. 11/22/14   [name], FNP  omeprazole (PRILOSEC) 40 MG capsule TAKE 1 CAPSULE (40 MG TOTAL) BY MOUTH DAILY. 11/23/14   [name], FNP  pramipexole (MIRAPEX) 0.5 MG tablet TAKE 1 TABLET (0.5 MG TOTAL) BY MOUTH 2 (TWO) TIMES DAILY. 08/24/14   Claretta Fraise, MD  sodium polystyrene sulfonate 50 GM/200ML enema Place 200 mLs (50 g total) rectally once. 11/16/14   Fransisca Kaufmann Dettinger, MD  traZODone (DESYREL) 50 MG tablet TAKE 1/2-1 TABLETS BY MOUTH AT BEDTIME AS NEEDED FOR SLEEP 09/27/14   Claretta Fraise, MD   BP 151/86 mmHg  Pulse 92  Temp(Src) 98.4 F (36.9 C) (Oral)  Resp 16  Ht 5\' 2"  (1.575 m)  Wt 212 lb (96.163 kg)  BMI 38.77 kg/m2  SpO2 98% Physical Exam  Constitutional: She is oriented to person, place, and time. She appears well-developed and well-nourished.  Patient is moderately obese. She is alert and nontoxic. She has no respiratory distress. She is otherwise well appearance.  HENT:  Head: Normocephalic and atraumatic.  Mouth/Throat: Oropharynx is clear and moist.  Eyes: EOM are normal. Pupils are equal, round, and reactive to light.  Neck: Neck supple. No thyromegaly present.  Cardiovascular: Normal rate, regular rhythm, normal heart sounds and intact distal pulses.   Pulmonary/Chest: Effort normal. No respiratory distress. She has wheezes.  Occasional expiratory wheeze. Good air flow.  Abdominal: Soft. Bowel sounds are normal. She exhibits no distension. There is no tenderness.  Abdomen is moderately obese and protuberant. I do not appreciate specifically ascites. I cannot appreciate any localizing mass. Patient does not have tenderness to palpation of the abdomen.  Musculoskeletal: Normal range of motion. She exhibits edema. She exhibits no tenderness.  Patient has symmetric pitting edema at the ankles and the feet approximately 1+ to 2+. There is very mild erythema of bilateral lower legs. They're not hot to the  touch and do not appear cellulitic. There is 2+ symmetric pulses of the feet. There are no ulcers or wounds on the feet or the lower legs.  Neurological: She is alert and oriented to person, place, and time. She has normal strength. No cranial nerve deficit. She exhibits normal muscle tone. Coordination normal. GCS eye subscore is 4. GCS verbal subscore is 5. GCS motor subscore is 6.  Skin: Skin is warm, dry and intact. No rash noted.  Psychiatric: She has a normal mood and affect.    ED Course  Procedures (including critical care time) Labs Review Labs Reviewed  COMPREHENSIVE METABOLIC PANEL - Abnormal; Notable for the following:    Glucose, Bld 110 (*)    Creatinine, Ser 1.09 (*)    GFR calc non Af Amer 56 (*)    All other components within normal limits  BRAIN NATRIURETIC PEPTIDE  CBC WITH DIFFERENTIAL/PLATELET  URINALYSIS, ROUTINE W REFLEX MICROSCOPIC (NOT AT [facility])  PREGNANCY, URINE    Imaging Review Dg Chest 2 View  11/24/2014   CLINICAL DATA:  Bilateral lower extremity swelling  EXAM: CHEST  2 VIEW  COMPARISON:  05/24/2014  FINDINGS: The heart size and mediastinal contours are within normal limits. Both lungs are clear. The visualized skeletal structures are unremarkable.  IMPRESSION: No active cardiopulmonary disease.   Electronically Signed   By: Kathreen Devoid   On: 11/24/2014 16:24   US Venous Img Lower Bilateral  11/24/2014   CLINICAL DATA:  Bilateral lower extremity swelling for 3 weeks. Erythema today.  EXAM: BILATERAL LOWER EXTREMITY VENOUS DOPPLER ULTRASOUND  TECHNIQUE: Gray-scale sonography with graded compression, as well as color Doppler and duplex ultrasound were performed to evaluate the lower extremity deep venous systems from the level of the common femoral vein and including the common femoral, femoral, profunda femoral, popliteal and calf veins including the posterior tibial, peroneal and gastrocnemius veins when visible. The superficial great saphenous vein was also  interrogated. Spectral Doppler was utilized to evaluate flow at rest and with distal augmentation maneuvers in the common femoral, femoral and popliteal veins.  COMPARISON:  None.  FINDINGS: RIGHT LOWER EXTREMITY  Common Femoral Vein: No evidence of thrombus. Normal compressibility, respiratory phasicity and response to augmentation.  Saphenofemoral Junction: No evidence of thrombus. Normal compressibility and flow on color Doppler imaging.  Profunda Femoral Vein: No evidence of thrombus. Normal compressibility and flow on color Doppler imaging.  Femoral Vein: No evidence of thrombus. Normal compressibility, respiratory phasicity and response to augmentation.  Popliteal Vein: No evidence of thrombus. Normal compressibility, respiratory phasicity and response to augmentation.  Calf Veins: No evidence of thrombus. Normal compressibility and flow on color Doppler imaging.  Superficial Great Saphenous Vein: No evidence of thrombus. Normal compressibility and flow on color Doppler imaging.  Venous Reflux:  None.  Other Findings:  None.  LEFT LOWER EXTREMITY  Common Femoral Vein: No evidence of thrombus. Normal compressibility, respiratory phasicity and response to augmentation.  Saphenofemoral Junction: No evidence of thrombus. Normal compressibility and flow on color Doppler imaging.  Profunda Femoral Vein: No evidence of thrombus. Normal compressibility and flow on color Doppler imaging.  Femoral Vein: No evidence of thrombus. Normal compressibility, respiratory phasicity and response to augmentation.  Popliteal Vein: No evidence of thrombus. Normal compressibility, respiratory phasicity and response to augmentation.  Calf Veins: No evidence of thrombus. Normal compressibility and flow on color Doppler imaging.  Superficial Great Saphenous Vein: No evidence of thrombus. Normal compressibility and flow on color Doppler imaging.  Venous Reflux:  None.  Other Findings:  None.  IMPRESSION: No evidence of deep venous  thrombosis.   Electronically Signed   By: San Morelle M.D.   On: 11/24/2014 17:22   I have personally reviewed and evaluated these images and lab results as part of my medical decision-making.   EKG Interpretation   Date/Time:  Wednesday November 24 2014 15:44:53 EDT Ventricular Rate:  83 PR Interval:  158 QRS Duration: 78 QT Interval:  388 QTC Calculation: 455 R Axis:   -63 Text Interpretation:  Normal sinus rhythm Left axis deviation Low voltage  QRS Cannot rule out Anteroseptal infarct , age undetermined Abnormal ECG  agree. no acute ischemic appearance Confirmed by Johnney Killian, MD, Jeannie Done  [phone]) on 11/24/2014 4:09:52 PM      MDM   Final diagnoses:  Venous stasis  Peripheral edema   Patient is well in appearance. Ultrasound does not show evidence of DVT. Chest x-ray and BMP as well  as physical examination are not supportive of congestive heart failure. Asian has had chronic lower crampy swelling which is consistent with venous stasis. She does not have wounds or leukocytosis or fever to suggest cellulitis. At this time she is counseled on management of venous stasis and follow-up with her family physician. Patient had one episode of spotting postmenopausal. This was not a presenting complaint. Urinalysis is negative. She has no associated abdominal pain. Blood counts are normal. She is strongly advised on the necessity of following up with GYN for postmenopausal bleeding.    Charlesetta Shanks, MD 11/24/14 [phone]

## 2014-11-24 NOTE — ED Notes (Signed)
Pt still in [place]. Family updated

## 2014-11-24 NOTE — ED Notes (Signed)
Pt's husband present to drive. No new rx given

## 2014-11-24 NOTE — ED Notes (Signed)
Pt given drink per okay from [place]

## 2014-11-24 NOTE — ED Notes (Signed)
Pfeiffer MD at bedside 

## 2014-11-24 NOTE — Discharge Instructions (Signed)

## 2014-11-24 NOTE — ED Notes (Signed)
Patient transported to Ultrasound 

## 2014-11-24 NOTE — ED Notes (Signed)
C/o bilat leg swelling x 3 weeks with redness today

## 2014-11-30 ENCOUNTER — Other Ambulatory Visit: Payer: Self-pay | Admitting: Family Medicine

## 2014-11-30 ENCOUNTER — Telehealth: Payer: Self-pay | Admitting: Nurse Practitioner

## 2014-11-30 NOTE — Telephone Encounter (Signed)
i do not see where lasix was increased- ER note does not reflect increase- was it suppose to be a temporary increase?

## 2014-11-30 NOTE — Telephone Encounter (Signed)
Patient stated she did not know if it was temporary.  But stated she would go back to taking one tablet and would call back if she had any more problems

## 2014-12-03 ENCOUNTER — Encounter: Payer: Self-pay | Admitting: Gastroenterology

## 2014-12-03 ENCOUNTER — Ambulatory Visit (INDEPENDENT_AMBULATORY_CARE_PROVIDER_SITE_OTHER): Payer: 59 | Admitting: Gastroenterology

## 2014-12-03 VITALS — BP 132/82 | HR 80 | Ht 62.6 in | Wt 208.2 lb

## 2014-12-03 DIAGNOSIS — K5900 Constipation, unspecified: Secondary | ICD-10-CM | POA: Diagnosis not present

## 2014-12-03 DIAGNOSIS — Z1211 Encounter for screening for malignant neoplasm of colon: Secondary | ICD-10-CM

## 2014-12-03 DIAGNOSIS — K219 Gastro-esophageal reflux disease without esophagitis: Secondary | ICD-10-CM | POA: Diagnosis not present

## 2014-12-03 MED ORDER — POLYETHYLENE GLYCOL 3350 17 GM/SCOOP PO POWD: ORAL | Status: DC

## 2014-12-03 NOTE — Patient Instructions (Signed)
You have been scheduled for a colonoscopy. Please follow written instructions given to you at your visit today.  Please pick up your prep supplies at the pharmacy within the next 1-3 days. If you use inhalers (even only as needed), please bring them with you on the day of your procedure. Your physician has requested that you go to www.[email or web address].com and enter the access code given to you at your visit today. This web site gives a general overview about your procedure. However, you should still follow specific instructions given to you by our office regarding your preparation for the procedure.  We have sent the following medications to your pharmacy for you to pick up at your convenience: Suprep, Miralax

## 2014-12-03 NOTE — Progress Notes (Signed)
HPI :  54 y/o female here in consultation from Caryl Pina MD, for screening colonoscopy and constipation.   No prior CRC screening. She has been having some constipation over the past year. She had developed some low back pain and went on muscle relaxers and narcotics for DJD of her back, over the past year or so. She reports if she does not use a laxative she won't have a BM. She is taking hydrocodone 2-3 times per day. No blood in the stools normally, but does see some blood on the toilet paper. No perianal discomfort. No abdominal pains otherwise. She is taking dulcolax PRN for constipation. She has tried miralax and lactulose previously which did not help. She only took one dose per day of miralax. No FH of colon cancer. She has an extensive tobacco history and continues to use tobacco. No weight loss.  She is otherwise taking prilosec 40mg  q day to BID for her heartburn. She reports this controls her symptoms well and she does not have much breakthrough at all. No dysphagia. No nausea or vomiting.   She otherwise mentions she recently in the ER a few weeks ago for some LE edema. States it is not a heart problem. She was treated with lasix with resolution of her edema. No chest pain or shortness of breath.  Past Medical History  Diagnosis Date  . GERD (gastroesophageal reflux disease)   . Anxiety   . Allergy   . Hyperlipidemia   . Chronic back pain      Past Surgical History  Procedure Laterality Date  . Cholecystectomy    . Brain surgery      trig neuralgia   Family History  Problem Relation Age of Onset  . Hypertension Mother   . Diabetes Mother   . Arthritis Mother    Social History  Substance Use Topics  . Smoking status: Current Every Day Smoker -- 2.00 packs/day    Types: Cigarettes  . Smokeless tobacco: Never Used     Comment: Declined info  . Alcohol Use: No   Current Outpatient Prescriptions  Medication Sig Dispense Refill  . ALPRAZolam (XANAX) 0.5 MG  tablet Take 1 tablet (0.5 mg total) by mouth at bedtime as needed for anxiety. 30 tablet 1  . atorvastatin (LIPITOR) 40 MG tablet TAKE 1 TABLET (40 MG TOTAL) BY MOUTH DAILY. 90 tablet 1  . cholecalciferol (VITAMIN D) 1000 UNITS tablet Take 1,000 Units by mouth daily.    . Choline Fenofibrate (FENOFIBRIC ACID) 135 MG CPDR TAKE 1 CAPSULE BY MOUTH DAILY. 90 capsule 0  . cyclobenzaprine (FLEXERIL) 10 MG tablet TAKE 1 TABLET (10 MG TOTAL) BY MOUTH 3 (THREE) TIMES DAILY AS NEEDED FOR MUSCLE SPASMS. 90 tablet 1  . escitalopram (LEXAPRO) 10 MG tablet TAKE 1 TABLET (10 MG TOTAL) BY MOUTH DAILY. 90 tablet 1  . fish oil-omega-3 fatty acids 1000 MG capsule Take 2 g by mouth 2 (two) times daily.    . furosemide (LASIX) 20 MG tablet TAKE 1 TABLET (20 MG TOTAL) BY MOUTH AS NEEDED FOR EDEMA (USE UP TO ONCE A DAY AS NEEDED). 30 tablet 1  . HYDROcodone-acetaminophen (NORCO/VICODIN) 5-325 MG per tablet TAKE 1 TABLET TWICE A DAY AS NEEDED FOR SEVERE PAIN  0  . lactulose (CHRONULAC) 10 GM/15ML solution Take 15 mLs (10 g total) by mouth daily as needed for severe constipation. 120 mL 0  . omeprazole (PRILOSEC) 40 MG capsule TAKE 1 CAPSULE (40 MG TOTAL) BY MOUTH DAILY. [place]  capsule 0  . omeprazole (PRILOSEC) 40 MG capsule TAKE 1 CAPSULE (40 MG TOTAL) BY MOUTH DAILY. 90 capsule 0  . pramipexole (MIRAPEX) 0.5 MG tablet TAKE 1 TABLET (0.5 MG TOTAL) BY MOUTH 2 (TWO) TIMES DAILY. 180 tablet 0  . sodium polystyrene sulfonate 50 GM/200ML enema Place 200 mLs (50 g total) rectally once. 200 mL 0  . traZODone (DESYREL) 50 MG tablet TAKE 1/2-1 TABLETS BY MOUTH AT BEDTIME AS NEEDED FOR SLEEP 90 tablet 0  . Triamcinolone Acetonide (NASACORT AQ NA) Place into the nose.    . Wheat Dextrin (BENEFIBER) TABS Take by mouth.     No current facility-administered medications for this visit.   No Active Allergies   Review of Systems: All systems reviewed and negative except where noted in HPI.   Lab Results  Component Value Date   WBC  7.9 11/24/2014   HGB 14.5 11/24/2014   HCT 44.4 11/24/2014   MCV 95.9 11/24/2014   PLT 205 11/24/2014   Lab Results  Component Value Date   ALT 37 11/24/2014   AST 31 11/24/2014   ALKPHOS 61 11/24/2014   BILITOT 0.7 11/24/2014   Lab Results  Component Value Date   CREATININE 1.09* 11/24/2014   BUN 10 11/24/2014   NA 141 11/24/2014   K 3.8 11/24/2014   CL 106 11/24/2014   CO2 28 11/24/2014     Physical Exam: BP 132/82 mmHg  Pulse 80  Ht 5' 2.6" (1.59 m)  Wt 208 lb 4 oz (94.462 kg)  BMI 37.36 kg/m2 Constitutional: Pleasant,well-developed, female in no acute distress. HEENT: Normocephalic and atraumatic. Conjunctivae are normal. No scleral icterus. Neck supple.  Cardiovascular: Normal rate, regular rhythm.  Pulmonary/chest: Effort normal and breath sounds normal. No wheezing, rales or rhonchi. Abdominal: Soft, nondistended, protuberant, nontender. Bowel sounds active throughout. There are no masses palpable. No hepatomegaly. Extremities: no edema Lymphadenopathy: No cervical adenopathy noted. Neurological: Alert and oriented to person place and time. Skin: Skin is warm and dry. No rashes noted. Psychiatric: Normal mood and affect. Behavior is normal.   ASSESSMENT AND PLAN: 54 y/o female seen in consultation for constipation and colon cancer screening. She developed constipation since taking narcotics for back pain, and is almost certainly related. Using dulcolax PRN. Recommend miralax daily, start BID dosing, and can titrate up as needed to effect, and use dulcolax PRN. Hopefully she can come off narcotics in the future and her bowel habits normalize.   We otherwise discussed screening modalities for colon cancer. She wishes to proceed with optical colonoscopy after discussion of risks / benefits outlined below.   Regarding her GERD, it is well controlled at present time. Recommend lowest daily dose of PPI needed to control symptoms moving forward. Renal function stable,  check yearly. Consider Barrett's screening in light of tobacco use moving forward, which is her main risk factor. No anemia or alarm symptoms at present time.   The indications, risks, and benefits of colonoscopy were explained to the patient in detail. Risks include but are not limited to bleeding, perforation, adverse reaction to medications, and cardiopulmonary compromise. Sequelae include but are not limited to the possibility of surgery, hospitalization, and mortality. The patient verbalized understanding and wished to proceed. All questions answered, referred to the scheduler and bowel prep ordered. Further recommendations pending results of the exam.   Scotland Cellar, MD [facility] Pager [phone]   CC: Caryl Pina MD

## 2014-12-07 ENCOUNTER — Telehealth: Payer: Self-pay | Admitting: Gastroenterology

## 2014-12-07 ENCOUNTER — Other Ambulatory Visit: Payer: Self-pay

## 2014-12-07 DIAGNOSIS — K5900 Constipation, unspecified: Secondary | ICD-10-CM

## 2014-12-07 DIAGNOSIS — Z1211 Encounter for screening for malignant neoplasm of colon: Secondary | ICD-10-CM

## 2014-12-08 ENCOUNTER — Other Ambulatory Visit: Payer: Self-pay

## 2014-12-08 MED ORDER — NA SULFATE-K SULFATE-MG SULF 17.5-3.13-1.6 GM/177ML PO SOLN: ORAL | Status: DC

## 2014-12-08 NOTE — Telephone Encounter (Signed)
Called pt to inform her that her suprep was called in to her pharmacy.

## 2014-12-10 ENCOUNTER — Encounter: Payer: Self-pay | Admitting: Gastroenterology

## 2014-12-10 ENCOUNTER — Ambulatory Visit (AMBULATORY_SURGERY_CENTER): Payer: 59 | Admitting: Gastroenterology

## 2014-12-10 VITALS — BP 102/74 | HR 82 | Temp 98.9°F | Resp 18 | Ht 62.0 in | Wt 208.0 lb

## 2014-12-10 DIAGNOSIS — D128 Benign neoplasm of rectum: Secondary | ICD-10-CM

## 2014-12-10 DIAGNOSIS — D123 Benign neoplasm of transverse colon: Secondary | ICD-10-CM | POA: Diagnosis not present

## 2014-12-10 DIAGNOSIS — D124 Benign neoplasm of descending colon: Secondary | ICD-10-CM

## 2014-12-10 DIAGNOSIS — Z1211 Encounter for screening for malignant neoplasm of colon: Secondary | ICD-10-CM | POA: Diagnosis present

## 2014-12-10 DIAGNOSIS — D125 Benign neoplasm of sigmoid colon: Secondary | ICD-10-CM | POA: Diagnosis not present

## 2014-12-10 MED ORDER — SODIUM CHLORIDE 0.9 % IV SOLN: 500.0000 mL | INTRAVENOUS | Status: DC

## 2014-12-10 NOTE — Patient Instructions (Signed)
Discharge instructions given. Handouts on polyps and hemorrhoids. Resume previous medications. YOU HAD AN ENDOSCOPIC PROCEDURE TODAY AT THE [facility]:   Refer to the procedure report that was given to you for any specific questions about what was found during the examination.  If the procedure report does not answer your questions, please call your gastroenterologist to clarify.  If you requested that your care partner not be given the details of your procedure findings, then the procedure report has been included in a sealed envelope for you to review at your convenience later.  YOU SHOULD EXPECT: Some feelings of bloating in the abdomen. Passage of more gas than usual.  Walking can help get rid of the air that was put into your GI tract during the procedure and reduce the bloating. If you had a lower endoscopy (such as a colonoscopy or flexible sigmoidoscopy) you may notice spotting of blood in your stool or on the toilet paper. If you underwent a bowel prep for your procedure, you may not have a normal bowel movement for a few days.  Please Note:  You might notice some irritation and congestion in your nose or some drainage.  This is from the oxygen used during your procedure.  There is no need for concern and it should clear up in a day or so.  SYMPTOMS TO REPORT IMMEDIATELY:   Following lower endoscopy (colonoscopy or flexible sigmoidoscopy):  Excessive amounts of blood in the stool  Significant tenderness or worsening of abdominal pains  Swelling of the abdomen that is new, acute  Fever of 100F or higher   For urgent or emergent issues, a gastroenterologist can be reached at any hour by calling [phone].   DIET: Your first meal following the procedure should be a small meal and then it is ok to progress to your normal diet. Heavy or fried foods are harder to digest and may make you feel nauseous or bloated.  Likewise, meals heavy in dairy and vegetables can increase  bloating.  Drink plenty of fluids but you should avoid alcoholic beverages for 24 hours.  ACTIVITY:  You should plan to take it easy for the rest of today and you should NOT DRIVE or use heavy machinery until tomorrow (because of the sedation medicines used during the test).    FOLLOW UP: Our staff will call the number listed on your records the next business day following your procedure to check on you and address any questions or concerns that you may have regarding the information given to you following your procedure. If we do not reach you, we will leave a message.  However, if you are feeling well and you are not experiencing any problems, there is no need to return our call.  We will assume that you have returned to your regular daily activities without incident.  If any biopsies were taken you will be contacted by phone or by letter within the next 1-3 weeks.  Please call us at [phone] if you have not heard about the biopsies in 3 weeks.    SIGNATURES/CONFIDENTIALITY: You and/or your care partner have signed paperwork which will be entered into your electronic medical record.  These signatures attest to the fact that that the information above on your After Visit Summary has been reviewed and is understood.  Full responsibility of the confidentiality of this discharge information lies with you and/or your care-partner. 

## 2014-12-10 NOTE — Op Note (Signed)
[place]  [address]. [place], [address]   COLONOSCOPY PROCEDURE REPORT  PATIENT: Johnni, Wunschel  MR#: [id number] BIRTHDATE: 1960-08-23 , 66  yrs. old GENDER: female ENDOSCOPIST: Yetta Flock, MD REFERRED BY: [name] Kotyk Dettinger PROCEDURE DATE:  12/10/2014 PROCEDURE:   Colonoscopy, screening and Colonoscopy with snare polypectomy First Screening Colonoscopy - Avg.  risk and is 50 yrs.  old or older Yes.  Prior Negative Screening - Now for repeat screening. N/A  History of Adenoma - Now for follow-up colonoscopy & has been > or = to 3 yrs.  N/A  Polyps removed today? Yes ASA CLASS:   Class II INDICATIONS:Screening for colonic neoplasia and Colorectal Neoplasm Risk Assessment for this procedure is average risk. MEDICATIONS: Propofol 375 mg IV  DESCRIPTION OF PROCEDURE:   After the risks benefits and alternatives of the procedure were thoroughly explained, informed consent was obtained.  The digital rectal exam revealed an anal fissure.   The LB [id number]-H190 [id number]  endoscope was introduced through the anus and advanced to the cecum, which was identified by both the appendix and ileocecal valve. No adverse events experienced.   The quality of the prep was adequate  The instrument was then slowly withdrawn as the colon was fully examined. Estimated blood loss is zero unless otherwise noted in this procedure report.      COLON FINDINGS: A 95mm sessile polyp was noted in the transverse colona and removed via cold snare.  A 56mm sessile polyp was noted in the splenic flexure and removed via cold snare.  2 x 3-72mm polyps were noted in the descending colon and removed via cold snare.  Multiple polyps were noted throughout the rectum and sigmoid colon most consistent with benign hyperplastic polyps.  The largest of these, and any adenomatous appearing, were removed via cold snare.  8 x 3-4 mm polyps were removed in the sigmoid colon, with 2 x 13mm polyps removed  in the rectum.  Retroflexed views revealed internal hemorrhoids. The time to cecum = 5.2 Withdrawal time = 31.0   The scope was withdrawn and the procedure completed. COMPLICATIONS: There were no immediate complications.  ENDOSCOPIC IMPRESSION: Multiple colon polyps removed as described above. Most rectal / sigmoid polyps appeared hyperplastic, with a few adenomatous appearing and these were removed. Internal hemorrhoids Small anal fissure  RECOMMENDATIONS: Await pathology results Resume diet Resume medications Miralax for treatment of constipation  eSigned:  Yetta Flock, MD 12/10/2014 2:07 PM   cc: Caryl Pina MD, the patient   PATIENT NAME:  Kymiah, Araiza MR#: [id number]

## 2014-12-10 NOTE — Progress Notes (Signed)
Called to room to assist during endoscopic procedure.  Patient ID and intended procedure confirmed with present staff. Received instructions for my participation in the procedure from the performing physician.  

## 2014-12-10 NOTE — Progress Notes (Signed)
Transferred to recovery room. A/O x3, pleased with MAC.  VSS.  Report to Celia, RN. 

## 2014-12-13 ENCOUNTER — Other Ambulatory Visit: Payer: Self-pay | Admitting: Family Medicine

## 2014-12-13 ENCOUNTER — Telehealth: Payer: Self-pay | Admitting: *Deleted

## 2014-12-13 NOTE — Telephone Encounter (Signed)
  Follow up Call-  Call back number 12/10/2014  Post procedure Call Back phone  # [phone]  Permission to leave phone message Yes     Patient questions:  Do you have a fever, pain , or abdominal swelling? No. Pain Score  0 *  Have you tolerated food without any problems? Yes.    Have you been able to return to your normal activities? Yes.    Do you have any questions about your discharge instructions: Diet   No. Medications  No. Follow up visit  No.  Do you have questions or concerns about your Care? No.  Actions: * If pain score is 4 or above: No action needed, pain <4.

## 2014-12-16 ENCOUNTER — Encounter: Payer: Self-pay | Admitting: Gastroenterology

## 2014-12-26 ENCOUNTER — Other Ambulatory Visit: Payer: Self-pay | Admitting: Family Medicine

## 2014-12-27 ENCOUNTER — Other Ambulatory Visit: Payer: Self-pay | Admitting: Family Medicine

## 2014-12-27 NOTE — Telephone Encounter (Signed)
Last seen 11/16/14  Dr Dettinger  Last lipid 06/08/14

## 2015-01-06 ENCOUNTER — Telehealth: Payer: Self-pay | Admitting: Nurse Practitioner

## 2015-01-06 NOTE — Telephone Encounter (Signed)
Pt will get all the information for the referral and also speak with Dr.Ramos about her pain pills since he was prescribing them for her.

## 2015-01-06 NOTE — Telephone Encounter (Signed)
Laser surgery for what her back? I do not know who to send to for that does she have someone she wants to see.

## 2015-01-25 ENCOUNTER — Ambulatory Visit (INDEPENDENT_AMBULATORY_CARE_PROVIDER_SITE_OTHER): Payer: 59 | Admitting: Nurse Practitioner

## 2015-01-25 ENCOUNTER — Ambulatory Visit: Payer: 59 | Admitting: Family Medicine

## 2015-01-25 ENCOUNTER — Encounter: Payer: Self-pay | Admitting: Nurse Practitioner

## 2015-01-25 VITALS — BP 139/83 | HR 91 | Temp 97.2°F | Ht 62.0 in | Wt 214.0 lb

## 2015-01-25 DIAGNOSIS — R1013 Epigastric pain: Secondary | ICD-10-CM

## 2015-01-25 DIAGNOSIS — R609 Edema, unspecified: Secondary | ICD-10-CM

## 2015-01-25 DIAGNOSIS — J0101 Acute recurrent maxillary sinusitis: Secondary | ICD-10-CM

## 2015-01-25 MED ORDER — AMOXICILLIN 875 MG PO TABS: 875.0000 mg | ORAL_TABLET | Freq: Two times a day (BID) | ORAL | Status: DC

## 2015-01-25 MED ORDER — HYDROCODONE-HOMATROPINE 5-1.5 MG/5ML PO SYRP: 5.0000 mL | ORAL_SOLUTION | Freq: Four times a day (QID) | ORAL | Status: DC | PRN

## 2015-01-25 MED ORDER — FUROSEMIDE 40 MG PO TABS: 40.0000 mg | ORAL_TABLET | Freq: Every day | ORAL | Status: DC

## 2015-01-25 NOTE — Progress Notes (Signed)
Subjective:    Patient ID: Stacie Solis, female    DOB: Mar 03, 1960, 54 y.o.   MRN: [id number]  HPI Patient in today with 2 complaints: - sinus congestion with facila presure. Cough that is non productive- started about 1 week ago- no fever today. Has been using sudafed which has  Not helped. - Swelling in lower ext and she feels like her belly is swelling. This has been going on for a few weeks. She takes 20 mg of lasix daily and has not helped. She has seen GI and had colonoscopy and was normal.    Review of Systems  Constitutional: Negative for fever, activity change and appetite change.  HENT: Positive for congestion, facial swelling and sinus pressure. Negative for sore throat and trouble swallowing.   Respiratory: Positive for cough.   Cardiovascular: Negative for chest pain.  Gastrointestinal: Negative for abdominal pain, blood in stool, abdominal distention and anal bleeding.  Genitourinary: Negative.   Neurological: Negative.   Psychiatric/Behavioral: Negative.   All other systems reviewed and are negative.      Objective:   Physical Exam  Constitutional: She is oriented to person, place, and time. She appears well-developed and well-nourished.  HENT:  Right Ear: Hearing and external ear normal.  Left Ear: Hearing and external ear normal.  Nose: Mucosal edema and rhinorrhea present. Right sinus exhibits maxillary sinus tenderness and frontal sinus tenderness. Left sinus exhibits maxillary sinus tenderness and frontal sinus tenderness.  Mouth/Throat: Uvula is midline, oropharynx is clear and moist and mucous membranes are normal.  Cardiovascular: Normal rate and normal heart sounds.   Pulmonary/Chest: Effort normal and breath sounds normal.  Musculoskeletal: She exhibits edema (1+ edema).  Neurological: She is alert and oriented to person, place, and time.  Skin: Skin is warm and dry.  Psychiatric: She has a normal mood and affect. Her behavior is normal. Judgment and  thought content normal.   BP 139/83 mmHg  Pulse 91  Temp(Src) 97.2 F (36.2 C) (Oral)  Ht _0  (1.575 m)  Wt 214 lb (97.07 kg)  BMI 39.13 kg/m2       Assessment & Plan:  1. Epigastric pain Labs pending - CMP14+EGFR - Amylase - Lipase  2. Acute recurrent maxillary sinusitis 1. Take meds as prescribed 2. Use a cool mist humidifier especially during the winter months and when heat has been humid. 3. Use saline nose sprays frequently 4. Saline irrigations of the nose can be very helpful if done frequently.  * 4X daily for 1 week*  * Use of a nettie pot can be helpful with this. Follow directions with this* 5. Drink plenty of fluids 6. Keep thermostat turn down low 7.For any cough or congestion  Use plain Mucinex- regular strength or max strength is fine   * Children- consult with Pharmacist for dosing 8. For fever or aces or pains- take tylenol or ibuprofen appropriate for age and weight.  * for fevers greater than 101 orally you may alternate ibuprofen and tylenol every  3 hours.    - amoxicillin (AMOXIL) 875 MG tablet; Take 1 tablet (875 mg total) by mouth 2 (two) times daily. 1 po BID  Dispense: 20 tablet; Refill: 0 - HYDROcodone-homatropine (HYCODAN) 5-1.5 MG/5ML syrup; Take 5 mLs by mouth every 6 (six) hours as needed for cough.  Dispense: 120 mL; Refill: 0  3. Peripheral edema Wear compression hose daily Elevate when sitting Increased dose of lasix from 68m to 412m- furosemide (LASIX) 40 MG tablet;  Take 1 tablet (40 mg total) by mouth daily.  Dispense: 30 tablet; Refill: [place], FNP

## 2015-01-25 NOTE — Patient Instructions (Signed)

## 2015-01-26 LAB — CMP14+EGFR
A/G RATIO: 1.7 (ref 1.1–2.5)
ALBUMIN: 4.3 g/dL (ref 3.5–5.5)
ALK PHOS: 71 IU/L (ref 39–117)
ALT: 30 IU/L (ref 0–32)
AST: 27 IU/L (ref 0–40)
BUN / CREAT RATIO: 10 (ref 9–23)
BUN: 10 mg/dL (ref 6–24)
Bilirubin Total: 0.4 mg/dL (ref 0.0–1.2)
CHLORIDE: 101 mmol/L (ref 97–106)
CO2: 23 mmol/L (ref 18–29)
Calcium: 9.4 mg/dL (ref 8.7–10.2)
Creatinine, Ser: 1.03 mg/dL — ABNORMAL HIGH (ref 0.57–1.00)
GFR calc Af Amer: 70 mL/min/{1.73_m2} (ref >59)
GFR calc non Af Amer: 61 mL/min/{1.73_m2} (ref >59)
GLOBULIN, TOTAL: 2.5 g/dL (ref 1.5–4.5)
Glucose: 108 mg/dL — ABNORMAL HIGH (ref 65–99)
Potassium: 4.3 mmol/L (ref 3.5–5.2)
SODIUM: 139 mmol/L (ref 136–144)
Total Protein: 6.8 g/dL (ref 6.0–8.5)

## 2015-01-26 LAB — AMYLASE: AMYLASE: 39 U/L (ref 31–124)

## 2015-01-26 LAB — LIPASE: Lipase: 47 U/L (ref 0–59)

## 2015-02-01 ENCOUNTER — Telehealth: Payer: Self-pay | Admitting: Nurse Practitioner

## 2015-02-01 NOTE — Telephone Encounter (Signed)
Patient aware and she is wearing compression stockings

## 2015-02-01 NOTE — Telephone Encounter (Signed)
Patient aware of results and wants to know what is next. She is still having a lot of fluid retention. Please advise

## 2015-02-01 NOTE — Telephone Encounter (Signed)
Your kidney function is fine- we will just have to continue fluid pill as rx- something we are just going to ave to continue to battle- you can get some compression socks that will help with fluid retention.

## 2015-02-05 ENCOUNTER — Other Ambulatory Visit: Payer: Self-pay | Admitting: Family Medicine

## 2015-02-16 ENCOUNTER — Ambulatory Visit (INDEPENDENT_AMBULATORY_CARE_PROVIDER_SITE_OTHER): Payer: 59 | Admitting: Family Medicine

## 2015-02-16 ENCOUNTER — Encounter: Payer: Self-pay | Admitting: Family Medicine

## 2015-02-16 VITALS — BP 132/84 | HR 88 | Temp 98.5°F | Ht 62.0 in | Wt 216.4 lb

## 2015-02-16 DIAGNOSIS — J0101 Acute recurrent maxillary sinusitis: Secondary | ICD-10-CM

## 2015-02-16 MED ORDER — HYDROCODONE-HOMATROPINE 5-1.5 MG/5ML PO SYRP: 5.0000 mL | ORAL_SOLUTION | Freq: Four times a day (QID) | ORAL | Status: DC | PRN

## 2015-02-16 MED ORDER — AZITHROMYCIN 250 MG PO TABS: ORAL_TABLET | ORAL | Status: DC

## 2015-02-16 MED ORDER — PREDNISONE 20 MG PO TABS
ORAL_TABLET | ORAL | Status: DC
Start: 2015-02-16 — End: 2015-05-09

## 2015-02-16 NOTE — Progress Notes (Signed)
BP 132/84 mmHg  Pulse 88  Temp(Src) 98.5 F (36.9 C) (Oral)  Ht 5\' 2"  (1.575 m)  Wt 216 lb 6.4 oz (98.158 kg)  BMI 39.57 kg/m2   Subjective:    Patient ID: Stacie Solis, female    DOB: 1960-12-13, 54 y.o.   MRN: [id number]  HPI: Stacie Solis is a 54 y.o. female presenting on 02/16/2015 for Sinusitis and Cough, chest congestion   HPI Acute Sinusitis Patient presents because she has been having persistent sinus infections since she was here 2 weeks ago. She denies any fevers or chills. She complains of nasal congestion and sinus pressure and congestion and chest congestion and a cough. Her congestion and drainage is worse at night and she wakes up in the morning stopped up. She denies any wheezing or shortness of breath. She does have a cough that is nonproductive.  Relevant past medical, surgical, family and social history reviewed and updated as indicated. Interim medical history since our last visit reviewed. Allergies and medications reviewed and updated.  Review of Systems  Constitutional: Negative for fever and chills.  HENT: Positive for postnasal drip, rhinorrhea, sinus pressure and sore throat. Negative for congestion, ear discharge, ear pain and sneezing.   Eyes: Negative for pain, redness and visual disturbance.  Respiratory: Negative for chest tightness and shortness of breath.   Cardiovascular: Negative for chest pain and leg swelling.  Genitourinary: Negative for dysuria and difficulty urinating.  Musculoskeletal: Negative for back pain and gait problem.  Skin: Negative for rash.  Neurological: Negative for dizziness, light-headedness and headaches.  Psychiatric/Behavioral: Negative for behavioral problems and agitation.  All other systems reviewed and are negative.   Per HPI unless specifically indicated above     Medication List       This list is accurate as of: 02/16/15 10:33 AM.  Always use your most recent med list.               ALPRAZolam 0.5 MG  tablet  Commonly known as:  XANAX  Take 1 tablet (0.5 mg total) by mouth at bedtime as needed for anxiety.     atorvastatin 40 MG tablet  Commonly known as:  LIPITOR  TAKE 1 TABLET (40 MG TOTAL) BY MOUTH DAILY.     azithromycin 250 MG tablet  Commonly known as:  ZITHROMAX  Take 2 the first day and then one each day after.     BENEFIBER Tabs  Take by mouth.     cholecalciferol 1000 UNITS tablet  Commonly known as:  VITAMIN D  Take 1,000 Units by mouth daily.     cyclobenzaprine 10 MG tablet  Commonly known as:  FLEXERIL  TAKE 1 TABLET (10 MG TOTAL) BY MOUTH 3 (THREE) TIMES DAILY AS NEEDED FOR MUSCLE SPASMS.     escitalopram 10 MG tablet  Commonly known as:  LEXAPRO  TAKE 1 TABLET (10 MG TOTAL) BY MOUTH DAILY.     Fenofibric Acid 135 MG Cpdr  TAKE 1 CAPSULE BY MOUTH DAILY.     fish oil-omega-3 fatty acids 1000 MG capsule  Take 2 g by mouth 2 (two) times daily.     furosemide 40 MG tablet  Commonly known as:  LASIX  Take 1 tablet (40 mg total) by mouth daily.     HYDROcodone-acetaminophen 5-325 MG tablet  Commonly known as:  NORCO/VICODIN  TAKE 1 TABLET TWICE A DAY AS NEEDED FOR SEVERE PAIN     HYDROcodone-homatropine 5-1.5 MG/5ML syrup  Commonly known  as:  HYCODAN  Take 5 mLs by mouth every 6 (six) hours as needed for cough.     NASACORT AQ NA  Place into the nose.     omeprazole 40 MG capsule  Commonly known as:  PRILOSEC  TAKE 1 CAPSULE (40 MG TOTAL) BY MOUTH DAILY.     polyethylene glycol powder powder  Commonly known as:  GLYCOLAX/MIRALAX  Takw 17grams or one capful daily.     pramipexole 0.5 MG tablet  Commonly known as:  MIRAPEX  TAKE 1 TABLET (0.5 MG TOTAL) BY MOUTH 2 (TWO) TIMES DAILY.     predniSONE 20 MG tablet  Commonly known as:  DELTASONE  2 po at same time daily for 5 days     traZODone 50 MG tablet  Commonly known as:  DESYREL  TAKE 1/2-1 TABLETS BY MOUTH AT BEDTIME AS NEEDED FOR SLEEP           Objective:    BP 132/84 mmHg   Pulse 88  Temp(Src) 98.5 F (36.9 C) (Oral)  Ht 5\' 2"  (1.575 m)  Wt 216 lb 6.4 oz (98.158 kg)  BMI 39.57 kg/m2  Wt Readings from Last 3 Encounters:  02/16/15 216 lb 6.4 oz (98.158 kg)  01/25/15 214 lb (97.07 kg)  12/10/14 208 lb (94.348 kg)    Physical Exam  Constitutional: She is oriented to person, place, and time. She appears well-developed and well-nourished. No distress.  HENT:  Right Ear: Tympanic membrane, external ear and ear canal normal.  Left Ear: Tympanic membrane, external ear and ear canal normal.  Nose: Mucosal edema and rhinorrhea present. No epistaxis. Right sinus exhibits no maxillary sinus tenderness and no frontal sinus tenderness. Left sinus exhibits no maxillary sinus tenderness and no frontal sinus tenderness.  Mouth/Throat: Uvula is midline and mucous membranes are normal. Posterior oropharyngeal edema and posterior oropharyngeal erythema present. No oropharyngeal exudate or tonsillar abscesses.  Eyes: Conjunctivae and EOM are normal.  Neck: Neck supple. No thyromegaly present.  Cardiovascular: Normal rate, regular rhythm, normal heart sounds and intact distal pulses.   No murmur heard. Pulmonary/Chest: Effort normal and breath sounds normal. No respiratory distress. She has no wheezes.  Musculoskeletal: Normal range of motion. She exhibits no edema or tenderness.  Lymphadenopathy:    She has no cervical adenopathy.  Neurological: She is alert and oriented to person, place, and time. Coordination normal.  Skin: Skin is warm and dry. No rash noted. She is not diaphoretic.  Psychiatric: She has a normal mood and affect. Her behavior is normal.  Nursing note and vitals reviewed.     Assessment & Plan:       Problem List Items Addressed This Visit    None    Visit Diagnoses    Acute recurrent maxillary sinusitis    -  Primary    Relevant Medications    predniSONE (DELTASONE) 20 MG tablet    azithromycin (ZITHROMAX) 250 MG tablet     HYDROcodone-homatropine (HYCODAN) 5-1.5 MG/5ML syrup       Follow up plan: Return if symptoms worsen or fail to improve.  Counseling provided for all of the vaccine components No orders of the defined types were placed in this encounter.    Caryl Pina, MD [facility] 02/16/2015, 10:33 AM

## 2015-03-06 ENCOUNTER — Other Ambulatory Visit: Payer: Self-pay | Admitting: Family Medicine

## 2015-03-07 ENCOUNTER — Other Ambulatory Visit: Payer: Self-pay | Admitting: Family Medicine

## 2015-03-14 ENCOUNTER — Other Ambulatory Visit: Payer: Self-pay | Admitting: Nurse Practitioner

## 2015-03-24 ENCOUNTER — Other Ambulatory Visit: Payer: Self-pay | Admitting: Family Medicine

## 2015-03-24 NOTE — Telephone Encounter (Signed)
Last seen 02/16/15  Dr Dettinger  Last lipid 06/08/14

## 2015-03-24 NOTE — Telephone Encounter (Signed)
Last seen 02/16/15  Dr Dettinger  Last lipid 06/08/14  Requesting 90 day supply

## 2015-05-04 ENCOUNTER — Telehealth: Payer: Self-pay | Admitting: Nurse Practitioner

## 2015-05-09 ENCOUNTER — Encounter: Payer: Self-pay | Admitting: Nurse Practitioner

## 2015-05-09 ENCOUNTER — Ambulatory Visit (INDEPENDENT_AMBULATORY_CARE_PROVIDER_SITE_OTHER): Payer: 59 | Admitting: Nurse Practitioner

## 2015-05-09 VITALS — BP 140/81 | HR 77 | Temp 96.6°F | Ht 62.0 in | Wt 213.0 lb

## 2015-05-09 DIAGNOSIS — G8929 Other chronic pain: Secondary | ICD-10-CM | POA: Diagnosis not present

## 2015-05-09 DIAGNOSIS — M545 Low back pain: Secondary | ICD-10-CM

## 2015-05-09 DIAGNOSIS — R300 Dysuria: Secondary | ICD-10-CM | POA: Diagnosis not present

## 2015-05-09 LAB — URINALYSIS, COMPLETE
Bilirubin, UA: NEGATIVE
Glucose, UA: NEGATIVE
Ketones, UA: NEGATIVE
LEUKOCYTES UA: NEGATIVE
NITRITE UA: NEGATIVE
PH UA: 6 (ref 5.0–7.5)
Protein, UA: NEGATIVE
RBC, UA: NEGATIVE
Urobilinogen, Ur: 0.2 mg/dL (ref 0.2–1.0)

## 2015-05-09 LAB — MICROSCOPIC EXAMINATION
Bacteria, UA: NONE SEEN
RBC MICROSCOPIC, UA: NONE SEEN /HPF (ref 0–?)

## 2015-05-09 MED ORDER — PREDNISONE 10 MG (21) PO TBPK: 10.0000 mg | ORAL_TABLET | Freq: Every day | ORAL | Status: DC

## 2015-05-09 NOTE — Patient Instructions (Signed)

## 2015-05-09 NOTE — Telephone Encounter (Signed)
Detailed message left for patient that if she still needs to be seen to please call back if not she may disregard this message.

## 2015-05-09 NOTE — Progress Notes (Signed)
   Subjective:    Patient ID: Stacie Solis, female    DOB: 1960-07-04, 55 y.o.   MRN: [id number]   Pt in today with c/o back pain in the kidney area that radiates to the front lower right quadrant  Back Pain This is a new problem. The current episode started more than 1 month ago. The problem occurs constantly. The problem is unchanged. The quality of the pain is described as aching and stabbing. The pain does not radiate. The pain is at a severity of 4/10. The pain is moderate. The pain is the same all the time. The symptoms are aggravated by bending, standing and twisting. Stiffness is present all day. Associated symptoms include abdominal pain and pelvic pain. She has tried muscle relaxant (flexeril) for the symptoms. The treatment provided mild relief.   Non-pitting Edema in ankles and legs bilaterally Taking lasix in the morning which does not seem to be helping    Review of Systems  Constitutional: Negative.   HENT: Negative.   Eyes: Negative.   Respiratory: Negative.   Cardiovascular: Negative.   Gastrointestinal: Positive for abdominal pain.  Endocrine: Negative.   Genitourinary: Positive for pelvic pain.       Denies any hematuria  Musculoskeletal: Positive for back pain.  Allergic/Immunologic: Negative.   Neurological: Negative.   Hematological: Negative.   Psychiatric/Behavioral: Negative.        Objective:   Physical Exam  Constitutional: She is oriented to person, place, and time. She appears well-developed and well-nourished.  Neck: Normal range of motion.  Cardiovascular: Normal rate, regular rhythm and normal heart sounds.   Pulmonary/Chest: Effort normal and breath sounds normal.  Abdominal: Soft. Bowel sounds are normal.  Musculoskeletal: Normal range of motion.  Neurological: She is alert and oriented to person, place, and time.  Skin: Skin is warm and dry.  Psychiatric: She has a normal mood and affect. Her behavior is normal. Judgment and thought content  normal.    BP 140/81 mmHg  Pulse 77  Temp(Src) 96.6 F (35.9 C) (Oral)  Ht 5\' 2"  (1.575 m)  Wt 213 lb (96.616 kg)  BMI 38.95 kg/m2        Assessment & Plan:   1. Dysuria Urine clear - Urinalysis, Complete  2. Chronic lower back pain Moist heat Rest Increase flexeril dose back to 10mg  RTO prn - predniSONE (STERAPRED UNI-PAK 21 TAB) 10 MG (21) TBPK tablet; Take 1 tablet (10 mg total) by mouth daily. As directed x 6 days  Dispense: 21 tablet; Refill: 0  [name], [place] FNP Student

## 2015-05-10 ENCOUNTER — Ambulatory Visit: Payer: 59 | Admitting: Nurse Practitioner

## 2015-05-29 ENCOUNTER — Other Ambulatory Visit: Payer: Self-pay | Admitting: Nurse Practitioner

## 2015-06-02 ENCOUNTER — Other Ambulatory Visit: Payer: Self-pay | Admitting: Family Medicine

## 2015-06-02 ENCOUNTER — Other Ambulatory Visit: Payer: Self-pay | Admitting: Nurse Practitioner

## 2015-06-02 ENCOUNTER — Ambulatory Visit (INDEPENDENT_AMBULATORY_CARE_PROVIDER_SITE_OTHER): Payer: 59 | Admitting: Family Medicine

## 2015-06-02 ENCOUNTER — Encounter: Payer: Self-pay | Admitting: Family Medicine

## 2015-06-02 VITALS — BP 120/80 | HR 88 | Temp 98.2°F | Ht 62.0 in | Wt 208.6 lb

## 2015-06-02 DIAGNOSIS — L218 Other seborrheic dermatitis: Secondary | ICD-10-CM

## 2015-06-02 DIAGNOSIS — L739 Follicular disorder, unspecified: Secondary | ICD-10-CM | POA: Diagnosis not present

## 2015-06-02 DIAGNOSIS — R05 Cough: Secondary | ICD-10-CM | POA: Diagnosis not present

## 2015-06-02 DIAGNOSIS — R053 Chronic cough: Secondary | ICD-10-CM

## 2015-06-02 DIAGNOSIS — L219 Seborrheic dermatitis, unspecified: Secondary | ICD-10-CM

## 2015-06-02 MED ORDER — CYCLOBENZAPRINE HCL 10 MG PO TABS: ORAL_TABLET | ORAL | Status: DC

## 2015-06-02 MED ORDER — PYRITHIONE ZINC 1 % EX SHAM: MEDICATED_SHAMPOO | Freq: Every day | CUTANEOUS | Status: DC | PRN

## 2015-06-02 MED ORDER — GUAIFENESIN-CODEINE 100-10 MG/5ML PO SOLN: 5.0000 mL | Freq: Three times a day (TID) | ORAL | Status: DC | PRN

## 2015-06-02 MED ORDER — CEPHALEXIN 500 MG PO CAPS: 500.0000 mg | ORAL_CAPSULE | Freq: Two times a day (BID) | ORAL | Status: DC

## 2015-06-02 NOTE — Progress Notes (Signed)
BP 120/80 mmHg  Pulse 88  Temp(Src) 98.2 F (36.8 C) (Oral)  Ht 5\' 2"  (1.575 m)  Wt 208 lb 9.6 oz (94.62 kg)  BMI 38.14 kg/m2   Subjective:    Patient ID: Stacie Solis, female    DOB: [birth year], 55 y.o.   MRN: [id number]  HPI: Stacie Solis is a 55 y.o. female presenting on 06/02/2015 for Knots on back of head and behind right ear and Medication Refill   HPI Knots on the back of her head and behind right ear Patient has been having some small knots on the back of her hairline and behind her ears. She denies any drainage or erythema or warmth out of those spots but they are very pruritic and sometimes slightly tender to touch. She says they have been there about 2 weeks. She denies any fevers or chills. She does not know that she's ever had these before.  Chronic cough Patient is a known smoker and has had a chronic cough especially worse at night. Currently it is been going on at least a few months. She usually uses some kind of cough suppressant for it to help her get over it and then it improves. Discussed smoking cessation and its importance with her. She understands.the cough is dry and nonproductive. She denies any fevers or chills or wheezing or shortness of breath. She denies really having nasal symptoms or sinus congestion currently.  Relevant past medical, surgical, family and social history reviewed and updated as indicated. Interim medical history since our last visit reviewed. Allergies and medications reviewed and updated.  Review of Systems  Constitutional: Negative for fever and chills.  HENT: Negative for congestion, ear discharge and ear pain.   Eyes: Negative for redness and visual disturbance.  Respiratory: Positive for cough. Negative for chest tightness and shortness of breath.   Cardiovascular: Negative for chest pain and leg swelling.  Genitourinary: Negative for dysuria and difficulty urinating.  Musculoskeletal: Negative for back pain and gait problem.    Skin: Positive for rash.  Neurological: Negative for light-headedness and headaches.  Psychiatric/Behavioral: Negative for behavioral problems and agitation.  All other systems reviewed and are negative.   Per HPI unless specifically indicated above     Medication List       This list is accurate as of: 06/02/15 10:48 AM.  Always use your most recent med list.               ALPRAZolam 0.5 MG tablet  Commonly known as:  XANAX  Take 1 tablet (0.5 mg total) by mouth at bedtime as needed for anxiety.     atorvastatin 40 MG tablet  Commonly known as:  LIPITOR  TAKE 1 TABLET (40 MG TOTAL) BY MOUTH DAILY.     BENEFIBER Tabs  Take by mouth.     cephALEXin 500 MG capsule  Commonly known as:  KEFLEX  Take 1 capsule (500 mg total) by mouth 2 (two) times daily.     cholecalciferol 1000 units tablet  Commonly known as:  VITAMIN D  Take 1,000 Units by mouth daily.     cyclobenzaprine 10 MG tablet  Commonly known as:  FLEXERIL  TAKE 1 TABLET (10 MG TOTAL) BY MOUTH 3 (THREE) TIMES DAILY AS NEEDED FOR MUSCLE SPASMS.     escitalopram 10 MG tablet  Commonly known as:  LEXAPRO  TAKE 1 TABLET (10 MG TOTAL) BY MOUTH DAILY.     Fenofibric Acid 135 MG Cpdr  TAKE  1 CAPSULE BY MOUTH DAILY.     fish oil-omega-3 fatty acids 1000 MG capsule  Take 2 g by mouth 2 (two) times daily.     furosemide 40 MG tablet  Commonly known as:  LASIX  TAKE 1 TABLET (40 MG TOTAL) BY MOUTH DAILY.     HYDROcodone-acetaminophen 5-325 MG tablet  Commonly known as:  NORCO/VICODIN  TAKE 1 TABLET TWICE A DAY AS NEEDED FOR SEVERE PAIN     NASACORT AQ NA  Place into the nose.     omeprazole 40 MG capsule  Commonly known as:  PRILOSEC  TAKE 1 CAPSULE (40 MG TOTAL) BY MOUTH DAILY.     PROAIR HFA 108 (90 Base) MCG/ACT inhaler  Generic drug:  albuterol  USE 2 PUFFS EVERY 4 HOURS AS NEEDED     pyrithione zinc 1 % shampoo  Commonly known as:  HEAD AND SHOULDERS  Apply topically daily as needed for  itching.     traZODone 50 MG tablet  Commonly known as:  DESYREL  TAKE 1/2-1 TABLETS BY MOUTH AT BEDTIME AS NEEDED FOR SLEEP           Objective:    BP 120/80 mmHg  Pulse 88  Temp(Src) 98.2 F (36.8 C) (Oral)  Ht 5\' 2"  (1.575 m)  Wt 208 lb 9.6 oz (94.62 kg)  BMI 38.14 kg/m2  Wt Readings from Last 3 Encounters:  06/02/15 208 lb 9.6 oz (94.62 kg)  05/09/15 213 lb (96.616 kg)  02/16/15 216 lb 6.4 oz (98.158 kg)    Physical Exam  Constitutional: She is oriented to person, place, and time. She appears well-developed and well-nourished. No distress.  HENT:  Right Ear: External ear normal.  Left Ear: External ear normal.  Nose: Nose normal.  Mouth/Throat: Oropharynx is clear and moist. No oropharyngeal exudate.  Eyes: Conjunctivae and EOM are normal. Pupils are equal, round, and reactive to light.  Neck: Neck supple. No thyromegaly present.  Cardiovascular: Normal rate, regular rhythm, normal heart sounds and intact distal pulses.   No murmur heard. Pulmonary/Chest: Effort normal and breath sounds normal. No respiratory distress. She has no wheezes. She has no rales.  Musculoskeletal: Normal range of motion. She exhibits no edema or tenderness.  Lymphadenopathy:    She has no cervical adenopathy.  Neurological: She is alert and oriented to person, place, and time. Coordination normal.  Skin: Skin is warm and dry. Rash noted. Rash is papular (Small papules lining the hairline in the occipital region and behind the ears. No drainage, small amount of erythema on the papule itself, no signs of nits or lice in her hair.). She is not diaphoretic.  Psychiatric: She has a normal mood and affect. Her behavior is normal.  Nursing note and vitals reviewed.     Assessment & Plan:   Problem List Items Addressed This Visit    None    Visit Diagnoses    Seborrheic dermatitis of scalp    -  Primary    Relevant Medications    pyrithione zinc (HEAD AND SHOULDERS) 1 % shampoo     Folliculitis        Relevant Medications    cephALEXin (KEFLEX) 500 MG capsule    Chronic cough            Follow up plan: Return if symptoms worsen or fail to improve.  Counseling provided for all of the vaccine components No orders of the defined types were placed in this encounter.    Stacie Solis Kotyk  Hanae Waiters, MD [facility] 06/02/2015, 10:48 AM

## 2015-06-19 ENCOUNTER — Other Ambulatory Visit: Payer: Self-pay | Admitting: Family Medicine

## 2015-06-20 NOTE — Telephone Encounter (Signed)
Last refill without being seen Needs labs

## 2015-06-20 NOTE — Telephone Encounter (Signed)
Last seen 06/02/15 Dr Dettinger  Last lipid 06/08/14   MMM PCP

## 2015-07-11 ENCOUNTER — Telehealth: Payer: Self-pay

## 2015-07-11 DIAGNOSIS — Z9289 Personal history of other medical treatment: Secondary | ICD-10-CM

## 2015-07-11 DIAGNOSIS — R928 Other abnormal and inconclusive findings on diagnostic imaging of breast: Secondary | ICD-10-CM

## 2015-07-11 NOTE — Telephone Encounter (Signed)
Need a referral for a mammogram at [facility]  Past due

## 2015-07-14 ENCOUNTER — Telehealth: Payer: Self-pay | Admitting: *Deleted

## 2015-07-14 NOTE — Telephone Encounter (Addendum)
Patient had diagnostic mammo done at [facility] on 07/22/14 and told to f/u in 6 months. See report.  This has not been done and needs to be scheduled.  Order placed for bilateral diagnostic mammogram.

## 2015-07-14 NOTE — Addendum Note (Signed)
Addended by: Ilean China on: 07/14/2015 03:30 PM   Modules accepted: Orders

## 2015-07-24 ENCOUNTER — Other Ambulatory Visit: Payer: Self-pay | Admitting: Nurse Practitioner

## 2015-08-03 ENCOUNTER — Other Ambulatory Visit: Payer: Self-pay | Admitting: *Deleted

## 2015-08-03 MED ORDER — FUROSEMIDE 40 MG PO TABS: ORAL_TABLET | ORAL | Status: DC

## 2015-08-09 NOTE — Telephone Encounter (Signed)
Erroneous Encounter

## 2015-08-13 ENCOUNTER — Other Ambulatory Visit: Payer: Self-pay | Admitting: Family Medicine

## 2015-08-13 ENCOUNTER — Other Ambulatory Visit: Payer: Self-pay | Admitting: Nurse Practitioner

## 2015-08-15 NOTE — Telephone Encounter (Signed)
Last seen 06/02/15 MMM  Last lipid 06/08/14  Requesting 90 day supply

## 2015-08-16 ENCOUNTER — Telehealth: Payer: Self-pay

## 2015-08-16 DIAGNOSIS — R928 Other abnormal and inconclusive findings on diagnostic imaging of breast: Secondary | ICD-10-CM

## 2015-08-16 NOTE — Telephone Encounter (Signed)
Late for F/U DX Mammo [facility]   Need order placed for one   [facility] with [place] [facility]   MMM patient

## 2015-09-02 ENCOUNTER — Other Ambulatory Visit: Payer: Self-pay | Admitting: Family Medicine

## 2015-09-23 ENCOUNTER — Other Ambulatory Visit: Payer: Self-pay | Admitting: Nurse Practitioner

## 2015-09-23 ENCOUNTER — Other Ambulatory Visit: Payer: Self-pay | Admitting: Family

## 2015-09-23 NOTE — Telephone Encounter (Signed)
Forwarding to PCP/MMM 

## 2015-10-26 ENCOUNTER — Telehealth: Payer: Self-pay | Admitting: Nurse Practitioner

## 2015-11-02 ENCOUNTER — Telehealth: Payer: Self-pay | Admitting: Nurse Practitioner

## 2015-11-02 ENCOUNTER — Other Ambulatory Visit: Payer: Self-pay | Admitting: Nurse Practitioner

## 2015-11-04 MED ORDER — PRAMIPEXOLE DIHYDROCHLORIDE 0.5 MG PO TABS: 0.5000 mg | ORAL_TABLET | Freq: Three times a day (TID) | ORAL | 3 refills | Status: DC

## 2015-11-04 NOTE — Telephone Encounter (Signed)
Spoke to pt:  Not in med list???  - she says MMM refilled it last  --  [facility]  Mirapex 0.5  - 1 tab QHS   She is out and her legs hurt so bad  Please send today if possible

## 2015-11-24 ENCOUNTER — Other Ambulatory Visit: Payer: Self-pay | Admitting: *Deleted

## 2015-11-24 MED ORDER — PRAMIPEXOLE DIHYDROCHLORIDE 0.5 MG PO TABS: 0.5000 mg | ORAL_TABLET | Freq: Three times a day (TID) | ORAL | 1 refills | Status: DC

## 2015-11-29 ENCOUNTER — Ambulatory Visit (INDEPENDENT_AMBULATORY_CARE_PROVIDER_SITE_OTHER): Payer: 59 | Admitting: Nurse Practitioner

## 2015-11-29 ENCOUNTER — Encounter: Payer: Self-pay | Admitting: Nurse Practitioner

## 2015-11-29 VITALS — BP 139/84 | HR 80 | Temp 97.1°F | Ht 62.0 in | Wt 215.0 lb

## 2015-11-29 DIAGNOSIS — J441 Chronic obstructive pulmonary disease with (acute) exacerbation: Secondary | ICD-10-CM

## 2015-11-29 DIAGNOSIS — J0101 Acute recurrent maxillary sinusitis: Secondary | ICD-10-CM | POA: Diagnosis not present

## 2015-11-29 MED ORDER — HYDROCODONE-HOMATROPINE 5-1.5 MG/5ML PO SYRP: 5.0000 mL | ORAL_SOLUTION | Freq: Four times a day (QID) | ORAL | 0 refills | Status: DC | PRN

## 2015-11-29 MED ORDER — AZITHROMYCIN 250 MG PO TABS: ORAL_TABLET | ORAL | 0 refills | Status: DC

## 2015-11-29 NOTE — Progress Notes (Signed)
Subjective:     Stacie Solis is a 55 y.o. female who presents for evaluation of sinus pain. Symptoms include: congestion, cough, facial pain, headaches and sore throat. Onset of symptoms was 7 days ago. Symptoms have been gradually worsening since that time. Past history is significant for chronic bronchitis. Patient is a smoker  (2 ppd x >20 yrs).  The following portions of the patient's history were reviewed and updated as appropriate: allergies, current medications, past family history, past medical history, past social history, past surgical history and problem list.  Review of Systems Pertinent items noted in HPI and remainder of comprehensive ROS otherwise negative.   Objective:    BP 139/84   Pulse 80   Temp 97.1 F (36.2 C) (Oral)   Ht 5\' 2"  (1.575 m)   Wt 215 lb (97.5 kg)   BMI 39.32 kg/m  General appearance: alert and cooperative Eyes: conjunctivae/corneas clear. PERRL, EOM's intact. Fundi benign. Ears: normal TM's and external ear canals both ears Nose: clear discharge, moderate congestion, turbinates red, sinus tenderness bilateral Throat: lips, mucosa, and tongue normal; teeth and gums normal Neck: no adenopathy, no carotid bruit, no JVD, supple, symmetrical, trachea midline and thyroid not enlarged, symmetric, no tenderness/mass/nodules Lungs: rhonchi bibasilar and deep cough Heart: regular rate and rhythm, S1, S2 normal, no murmur, click, rub or gallop    Assessment:    Acute bacterial sinusitis with exacerbation of chronic bronchitis.    Plan:   1. Take meds as prescribed 2. Use a cool mist humidifier especially during the winter months and when heat has been humid. 3. Use saline nose sprays frequently 4. Saline irrigations of the nose can be very helpful if done frequently.  * 4X daily for 1 week*  * Use of a nettie pot can be helpful with this. Follow directions with this* 5. Drink plenty of fluids 6. Keep thermostat turn down low 7.For any cough or  congestion  Use plain Mucinex- regular strength or max strength is fine   * Children- consult with Pharmacist for dosing 8. For fever or aces or pains- take tylenol or ibuprofen appropriate for age and weight.  * for fevers greater than 101 orally you may alternate ibuprofen and tylenol every  3 hours.   Meds ordered this encounter  Medications  . azithromycin (ZITHROMAX) 250 MG tablet    Sig: Two tablets day one, then one tablet daily next 4 days.    Dispense:  6 tablet    Refill:  0    Order Specific Question:   Supervising Provider    Answer:   VINCENT, CAROL L [[id number]]  . HYDROcodone-homatropine (HYCODAN) 5-1.5 MG/5ML syrup    Sig: Take 5 mLs by mouth every 6 (six) hours as needed for cough.    Dispense:  120 mL    Refill:  0    Order Specific Question:   Supervising Provider    Answer:   Eustaquio Maize [[id number]]   [name], FNP

## 2015-11-29 NOTE — Patient Instructions (Signed)

## 2015-12-01 ENCOUNTER — Other Ambulatory Visit: Payer: Self-pay | Admitting: Nurse Practitioner

## 2015-12-04 ENCOUNTER — Other Ambulatory Visit: Payer: Self-pay | Admitting: Nurse Practitioner

## 2015-12-05 ENCOUNTER — Other Ambulatory Visit: Payer: Self-pay | Admitting: Nurse Practitioner

## 2015-12-15 ENCOUNTER — Telehealth: Payer: Self-pay | Admitting: Nurse Practitioner

## 2015-12-16 NOTE — Telephone Encounter (Signed)
TC to pt she still has splitting HA, productive cough at times (yellow phlegm), hoarse, feels feverish & has chills at times. Same sxs as when she was here. She finished abx & cough meds, as well as taking mucinex & alcoselsor sinus.

## 2015-12-16 NOTE — Telephone Encounter (Signed)
Just give it time and treat symptoms with OTC meds.- Force fluids

## 2015-12-20 ENCOUNTER — Other Ambulatory Visit: Payer: Self-pay | Admitting: Family Medicine

## 2015-12-20 NOTE — Telephone Encounter (Signed)
Refilled denied- ntbs  

## 2015-12-20 NOTE — Telephone Encounter (Signed)
Patient aware and appointment made 12/23/15 at 12:30

## 2015-12-23 ENCOUNTER — Encounter: Payer: Self-pay | Admitting: Nurse Practitioner

## 2015-12-23 ENCOUNTER — Ambulatory Visit (INDEPENDENT_AMBULATORY_CARE_PROVIDER_SITE_OTHER): Payer: 59 | Admitting: Nurse Practitioner

## 2015-12-23 VITALS — BP 130/79 | HR 92 | Temp 97.4°F | Ht 62.0 in | Wt 213.0 lb

## 2015-12-23 DIAGNOSIS — F17200 Nicotine dependence, unspecified, uncomplicated: Secondary | ICD-10-CM

## 2015-12-23 DIAGNOSIS — E782 Mixed hyperlipidemia: Secondary | ICD-10-CM | POA: Diagnosis not present

## 2015-12-23 DIAGNOSIS — F410 Panic disorder [episodic paroxysmal anxiety] without agoraphobia: Secondary | ICD-10-CM

## 2015-12-23 DIAGNOSIS — M545 Low back pain, unspecified: Secondary | ICD-10-CM

## 2015-12-23 DIAGNOSIS — F3342 Major depressive disorder, recurrent, in full remission: Secondary | ICD-10-CM | POA: Diagnosis not present

## 2015-12-23 DIAGNOSIS — G8929 Other chronic pain: Secondary | ICD-10-CM

## 2015-12-23 DIAGNOSIS — J0101 Acute recurrent maxillary sinusitis: Secondary | ICD-10-CM

## 2015-12-23 DIAGNOSIS — G500 Trigeminal neuralgia: Secondary | ICD-10-CM | POA: Diagnosis not present

## 2015-12-23 DIAGNOSIS — R05 Cough: Secondary | ICD-10-CM

## 2015-12-23 DIAGNOSIS — Z1159 Encounter for screening for other viral diseases: Secondary | ICD-10-CM

## 2015-12-23 DIAGNOSIS — R053 Chronic cough: Secondary | ICD-10-CM

## 2015-12-23 DIAGNOSIS — K219 Gastro-esophageal reflux disease without esophagitis: Secondary | ICD-10-CM | POA: Diagnosis not present

## 2015-12-23 DIAGNOSIS — F5101 Primary insomnia: Secondary | ICD-10-CM | POA: Diagnosis not present

## 2015-12-23 DIAGNOSIS — G2581 Restless legs syndrome: Secondary | ICD-10-CM

## 2015-12-23 DIAGNOSIS — Z6835 Body mass index (BMI) 35.0-35.9, adult: Secondary | ICD-10-CM | POA: Diagnosis not present

## 2015-12-23 MED ORDER — PREDNISONE 20 MG PO TABS: ORAL_TABLET | ORAL | 0 refills | Status: DC

## 2015-12-23 MED ORDER — FENOFIBRIC ACID 135 MG PO CPDR: 1.0000 | DELAYED_RELEASE_CAPSULE | Freq: Every day | ORAL | 1 refills | Status: DC

## 2015-12-23 MED ORDER — AMOXICILLIN 875 MG PO TABS: 875.0000 mg | ORAL_TABLET | Freq: Two times a day (BID) | ORAL | 0 refills | Status: DC

## 2015-12-23 MED ORDER — GUAIFENESIN-CODEINE 100-10 MG/5ML PO SOLN: 5.0000 mL | Freq: Three times a day (TID) | ORAL | 0 refills | Status: DC | PRN

## 2015-12-23 MED ORDER — ATORVASTATIN CALCIUM 40 MG PO TABS: ORAL_TABLET | ORAL | 1 refills | Status: DC

## 2015-12-23 MED ORDER — ESCITALOPRAM OXALATE 10 MG PO TABS: ORAL_TABLET | ORAL | 1 refills | Status: DC

## 2015-12-23 MED ORDER — TRAZODONE HCL 50 MG PO TABS: ORAL_TABLET | ORAL | 1 refills | Status: DC

## 2015-12-23 NOTE — Patient Instructions (Signed)

## 2015-12-23 NOTE — Progress Notes (Signed)
Subjective:    Patient ID: Stacie Solis, female    DOB: 1960/11/23, 55 y.o.   MRN: [id number]   Patient here today for follow up of chronic medical problems.  Outpatient Encounter Prescriptions as of 12/23/2015  Medication Sig  . ALPRAZolam (XANAX) 0.5 MG tablet Take 1 tablet (0.5 mg total) by mouth at bedtime as needed for anxiety.  Marland Kitchen atorvastatin (LIPITOR) 40 MG tablet TAKE 1 TABLET (40 MG TOTAL) BY MOUTH DAILY.  Marland Kitchen atorvastatin (LIPITOR) 40 MG tablet TAKE 1 TABLET (40 MG TOTAL) BY MOUTH DAILY.  . cholecalciferol (VITAMIN D) 1000 UNITS tablet Take 1,000 Units by mouth daily.  . Choline Fenofibrate (FENOFIBRIC ACID) 135 MG CPDR TAKE 1 CAPSULE BY MOUTH DAILY.  . cyclobenzaprine (FLEXERIL) 10 MG tablet TAKE 1 TABLET (10 MG TOTAL) BY MOUTH 3 (THREE) TIMES DAILY AS NEEDED FOR MUSCLE SPASMS.  Marland Kitchen escitalopram (LEXAPRO) 10 MG tablet TAKE 1 TABLET (10 MG TOTAL) BY MOUTH DAILY.  . fish oil-omega-3 fatty acids 1000 MG capsule Take 2 g by mouth 2 (two) times daily.  . furosemide (LASIX) 40 MG tablet TAKE 1 TABLET (40 MG TOTAL) BY MOUTH DAILY.  Marland Kitchen guaiFENesin-codeine 100-10 MG/5ML syrup Take 5 mLs by mouth 3 (three) times daily as needed for cough.  Marland Kitchen omeprazole (PRILOSEC) 40 MG capsule TAKE 1 CAPSULE (40 MG TOTAL) BY MOUTH DAILY.  Marland Kitchen pramipexole (MIRAPEX) 0.5 MG tablet Take 1 tablet (0.5 mg total) by mouth 3 (three) times daily.  Marland Kitchen PROAIR HFA 108 (90 Base) MCG/ACT inhaler USE 2 PUFFS EVERY 4 HOURS AS NEEDED  . pyrithione zinc (HEAD AND SHOULDERS) 1 % shampoo Apply topically daily as needed for itching.  . traZODone (DESYREL) 50 MG tablet TAKE 1/2-1 TABLETS BY MOUTH AT BEDTIME AS NEEDED FOR SLEEP  . Triamcinolone Acetonide (NASACORT AQ NA) Place into the nose.  . Wheat Dextrin (BENEFIBER) TABS Take by mouth.   Hyperlipidemia  This is a chronic problem. The current episode started more than 1 year ago. Recent lipid tests were reviewed and are variable. Exacerbating diseases include obesity. She has no  history of diabetes or hypothyroidism. Pertinent negatives include no chest pain or myalgias. Current antihyperlipidemic treatment includes statins and fibric acid derivatives. The current treatment provides moderate improvement of lipids. Compliance problems include adherence to diet and adherence to exercise.  Risk factors for coronary artery disease include dyslipidemia, obesity and post-menopausal.  Depression/insomnia Taking lexapro 10 mg and trazodone 50 mg-Works well for her depression and anxiety- but doesn't help with sleep- says that body won't wind down to rest. Typically gets 4-5 hours of rest, wakes up fatigued.  RLS Well controlled with mirapex 0.5 mg Sinusitis Well controlled with nasacort and OTC medications as needed. She says she has had a runny nose for over a month- she uses her flonase everyday and has tried mucinex and she is still coughing. GERD Omeprazole is working well for symptoms-no break through episodes.   Review of Systems  Constitutional: Positive for fatigue (When unable to sleep). Negative for activity change, appetite change, fever and unexpected weight change.  HENT: Positive for congestion and sinus pressure. Negative for ear pain, postnasal drip, sore throat and voice change.   Eyes: Negative.  Negative for pain, discharge, itching and visual disturbance.  Respiratory: Positive for cough. Negative for chest tightness and wheezing.   Cardiovascular: Negative for chest pain, palpitations and leg swelling.  Gastrointestinal: Negative.  Negative for anal bleeding, blood in stool, constipation, nausea, rectal pain and vomiting.  Endocrine: Positive for heat intolerance. Negative for polydipsia, polyphagia and polyuria.  Genitourinary: Negative.  Negative for difficulty urinating and frequency.  Musculoskeletal: Positive for back pain (Lower back pain radiating to the right hip). Negative for myalgias.  Skin: Negative.   Allergic/Immunologic: Positive for  environmental allergies.  Neurological: Negative for dizziness, syncope, weakness, light-headedness, numbness and headaches.  Hematological: Negative.  Does not bruise/bleed easily.  Psychiatric/Behavioral: Positive for sleep disturbance (Difficulty falling asleep and staying asleep. ). Negative for behavioral problems, confusion, decreased concentration, self-injury and suicidal ideas. The patient is nervous/anxious (Only with major events).   All other systems reviewed and are negative.      Objective:   Physical Exam  Constitutional: She is oriented to person, place, and time. She appears well-developed and well-nourished.  HENT:  Head: Normocephalic.  Right Ear: Hearing, tympanic membrane, external ear and ear canal normal.  Left Ear: Hearing, tympanic membrane, external ear and ear canal normal.  Nose: Nose normal.  Mouth/Throat: Uvula is midline and oropharynx is clear and moist.  Eyes: Conjunctivae and EOM are normal. Pupils are equal, round, and reactive to light.  Neck: Trachea normal, normal range of motion and full passive range of motion without pain. Neck supple. No JVD present. Carotid bruit is not present. No thyroid mass and no thyromegaly present.  Cardiovascular: Normal rate, regular rhythm, normal heart sounds and intact distal pulses.  Exam reveals no gallop and no friction rub.   No murmur heard. Pulmonary/Chest: Effort normal. She has rales in the right lower field, the left middle field and the left lower field. Right breast exhibits no inverted nipple, no mass, no nipple discharge, no skin change and no tenderness. Left breast exhibits no inverted nipple, no mass, no nipple discharge, no skin change and no tenderness.  Abdominal: Soft. Bowel sounds are normal. She exhibits no distension and no mass. There is no tenderness.  Genitourinary: No breast swelling, tenderness, discharge or bleeding. There is no rash on the right labia.  Musculoskeletal: Normal range of  motion. She exhibits no edema.       Lumbar back: She exhibits tenderness. She exhibits normal range of motion, no bony tenderness, no edema and no deformity.  Full lumbar active ROM -increased right hip pain with this. No bony tenderness over spine or sacrum-tenderness right laterally to upper sacrum.   Lymphadenopathy:    She has no cervical adenopathy.  Neurological: She is alert and oriented to person, place, and time. She has normal strength and normal reflexes. No cranial nerve deficit or sensory deficit. Coordination and gait normal.  Skin: Skin is warm and dry.  Psychiatric: She has a normal mood and affect. Her behavior is normal. Judgment and thought content normal.   BP 130/79   Pulse 92   Temp 97.4 F (36.3 C) (Oral)   Ht '5\' 2"'  (1.575 m)   Wt 213 lb (96.6 kg)   BMI 38.96 kg/m          Assessment & Plan:   1. Gastroesophageal reflux disease without esophagitis Avoid spicy foods Do not eat 2 hours prior to bedtime  2. Trigeminal neuralgia  3. BMI 35.0-35.9,adult Discussed diet and exercise for person with BMI >25 Will recheck weight in 3-6 months  4. Chronic midline low back pain without sciatica Moist heat  To back Back stretching exercises  5. Recurrent major depressive disorder, in full remission ([place]) Stress management Grief counseling for death of mom - escitalopram (LEXAPRO) 10 MG tablet; TAKE 1 TABLET (  10 MG TOTAL) BY MOUTH DAILY.  Dispense: 90 tablet; Refill: 1  6. Mixed hyperlipidemia Low fat diet and exercise - atorvastatin (LIPITOR) 40 MG tablet; TAKE 1 TABLET (40 MG TOTAL) BY MOUTH DAILY.  Dispense: 90 tablet; Refill: 1 - atorvastatin (LIPITOR) 40 MG tablet; TAKE 1 TABLET (40 MG TOTAL) BY MOUTH DAILY.  Dispense: 90 tablet; Refill: 1 - Choline Fenofibrate (FENOFIBRIC ACID) 135 MG CPDR; Take 1 capsule by mouth daily.  Dispense: 90 capsule; Refill: 1 - CMP14+EGFR - Lipid panel  7. Primary insomnia Bedtime routine  8. Panic attacks  9. RLS  (restless legs syndrome) Keep legs warm at night  10. Smoker Smoking cessation encouraged  11. Chronic cough - guaiFENesin-codeine 100-10 MG/5ML syrup; Take 5 mLs by mouth 3 (three) times daily as needed for cough.  Dispense: 120 mL; Refill: 0  12. Acute recurrent maxillary sinusitis 1. Take meds as prescribed 2. Use a cool mist humidifier especially during the winter months and when heat has been humid. 3. Use saline nose sprays frequently 4. Saline irrigations of the nose can be very helpful if done frequently.  * 4X daily for 1 week*  * Use of a nettie pot can be helpful with this. Follow directions with this* 5. Drink plenty of fluids 6. Keep thermostat turn down low 7.For any cough or congestion  Use plain Mucinex- regular strength or max strength is fine   * Children- consult with Pharmacist for dosing 8. For fever or aces or pains- take tylenol or ibuprofen appropriate for age and weight.  * for fevers greater than 101 orally you may alternate ibuprofen and tylenol every  3 hours.    - amoxicillin (AMOXIL) 875 MG tablet; Take 1 tablet (875 mg total) by mouth 2 (two) times daily. 1 po BID  Dispense: 20 tablet; Refill: 0 - predniSONE (DELTASONE) 20 MG tablet; 2 po at sametime daily for 5 days  Dispense: 10 tablet; Refill: 0    Labs pending Health maintenance reviewed Diet and exercise encouraged Continue all meds Follow up  In 3 months   [place], FNP

## 2015-12-24 ENCOUNTER — Other Ambulatory Visit: Payer: Self-pay | Admitting: Family

## 2015-12-24 LAB — LIPID PANEL
CHOLESTEROL TOTAL: 134 mg/dL (ref 100–199)
Chol/HDL Ratio: 4.3 ratio units (ref 0.0–4.4)
HDL: 31 mg/dL — AB (ref >39)
LDL Calculated: 79 mg/dL (ref 0–99)
Triglycerides: 121 mg/dL (ref 0–149)
VLDL Cholesterol Cal: 24 mg/dL (ref 5–40)

## 2015-12-24 LAB — CMP14+EGFR
A/G RATIO: 1.6 (ref 1.2–2.2)
ALK PHOS: 77 IU/L (ref 39–117)
ALT: 38 IU/L — AB (ref 0–32)
AST: 36 IU/L (ref 0–40)
Albumin: 4.6 g/dL (ref 3.5–5.5)
BILIRUBIN TOTAL: 0.7 mg/dL (ref 0.0–1.2)
BUN/Creatinine Ratio: 12 (ref 9–23)
BUN: 13 mg/dL (ref 6–24)
CHLORIDE: 98 mmol/L (ref 96–106)
CO2: 24 mmol/L (ref 18–29)
Calcium: 9.7 mg/dL (ref 8.7–10.2)
Creatinine, Ser: 1.09 mg/dL — ABNORMAL HIGH (ref 0.57–1.00)
GFR calc non Af Amer: 56 mL/min/{1.73_m2} — ABNORMAL LOW (ref >59)
GFR, EST AFRICAN AMERICAN: 65 mL/min/{1.73_m2} (ref >59)
GLUCOSE: 101 mg/dL — AB (ref 65–99)
Globulin, Total: 2.9 g/dL (ref 1.5–4.5)
POTASSIUM: 4.4 mmol/L (ref 3.5–5.2)
Sodium: 139 mmol/L (ref 134–144)
TOTAL PROTEIN: 7.5 g/dL (ref 6.0–8.5)

## 2015-12-24 LAB — HEPATITIS C ANTIBODY

## 2015-12-27 NOTE — Telephone Encounter (Signed)
Patient seen since phone call 

## 2016-01-17 ENCOUNTER — Other Ambulatory Visit: Payer: Self-pay | Admitting: Nurse Practitioner

## 2016-01-31 ENCOUNTER — Encounter: Payer: Self-pay | Admitting: Nurse Practitioner

## 2016-01-31 ENCOUNTER — Ambulatory Visit (INDEPENDENT_AMBULATORY_CARE_PROVIDER_SITE_OTHER): Payer: 59 | Admitting: Nurse Practitioner

## 2016-01-31 VITALS — BP 129/75 | HR 81 | Temp 97.6°F | Ht 62.0 in | Wt 219.0 lb

## 2016-01-31 DIAGNOSIS — N950 Postmenopausal bleeding: Secondary | ICD-10-CM

## 2016-01-31 MED ORDER — MEDROXYPROGESTERONE ACETATE 10 MG PO TABS: 10.0000 mg | ORAL_TABLET | Freq: Every day | ORAL | 0 refills | Status: DC

## 2016-01-31 NOTE — Patient Instructions (Signed)
Postmenopausal Bleeding Postmenopausal bleeding is any bleeding after menopause. Menopause is when a woman's period stops. Any type of bleeding after menopause is concerning. It should be checked by your doctor. Any treatment will depend on the cause. Follow these instructions at home: Watch your condition for any changes.  Avoid the use of tampons and douches as told by your doctor.  Change your pads often.  Get regular pelvic exams and Pap tests.  Keep all appointments for tests as told by your doctor.  Contact a doctor if:  Your bleeding lasts for more than 1 week.  You have belly (abdominal) pain.  You have bleeding after sex (intercourse). Get help right away if:  You have a fever, chills, a headache, dizziness, muscle aches, and bleeding.  You have strong pain with bleeding.  You have clumps of blood (blood clots) coming from your vagina.  You have bleeding and need more than 1 pad an hour.  You feel like you are going to pass out (faint). This information is not intended to replace advice given to you by your health care provider. Make sure you discuss any questions you have with your health care provider. Document Released: 11/22/2007 Document Revised: 07/21/2015 Document Reviewed: 09/11/2012 Elsevier Interactive Patient Education  2017 [employer]vier Inc.  

## 2016-01-31 NOTE — Progress Notes (Signed)
   Subjective:    Patient ID: Stacie Solis, female    DOB: 17-Nov-1960, 55 y.o.   MRN: [id number]  HPI Patient in today c/o menstrual problems. When she turned 48 she went through menopause. SHe started bleeding again last month and started again yesterday- the bleeding is like a regular period. Lots of cramping.    Review of Systems  Constitutional: Negative.   HENT: Negative.   Respiratory: Negative.   Cardiovascular: Negative.   Gastrointestinal: Negative.   Genitourinary: Negative.   Neurological: Negative.   Psychiatric/Behavioral: Negative.   All other systems reviewed and are negative.      Objective:   Physical Exam  Constitutional: She is oriented to person, place, and time. She appears well-developed and well-nourished.  Cardiovascular: Normal rate, regular rhythm and normal heart sounds.   Pulmonary/Chest: Effort normal and breath sounds normal.  Genitourinary:  Genitourinary Comments: Pelvic exam deferred  Neurological: She is alert and oriented to person, place, and time.  Skin: Skin is warm.  Psychiatric: She has a normal mood and affect. Her behavior is normal. Judgment and thought content normal.   BP 129/75   Pulse 81   Temp 97.6 F (36.4 C) (Oral)   Ht 5\' 2"  (1.575 m)   Wt 219 lb (99.3 kg)   BMI 40.06 kg/m         Assessment & Plan:  1. Postmenopausal bleeding Meds ordered this encounter  Medications  . medroxyPROGESTERone (PROVERA) 10 MG tablet    Sig: Take 1 tablet (10 mg total) by mouth daily.    Dispense:  10 tablet    Refill:  0    Order Specific Question:   Supervising Provider    Answer:   Eustaquio Maize [[id number]]   After 2 weeks RTO for endometrial biopsy  [name], FNP

## 2016-02-02 ENCOUNTER — Other Ambulatory Visit: Payer: Self-pay | Admitting: Nurse Practitioner

## 2016-02-22 ENCOUNTER — Other Ambulatory Visit: Payer: Self-pay | Admitting: Nurse Practitioner

## 2016-02-23 ENCOUNTER — Encounter: Payer: Self-pay | Admitting: Nurse Practitioner

## 2016-02-23 ENCOUNTER — Ambulatory Visit (INDEPENDENT_AMBULATORY_CARE_PROVIDER_SITE_OTHER): Payer: 59 | Admitting: Nurse Practitioner

## 2016-02-23 VITALS — BP 121/75 | HR 80 | Temp 97.7°F | Ht 62.0 in | Wt 219.0 lb

## 2016-02-23 DIAGNOSIS — F3342 Major depressive disorder, recurrent, in full remission: Secondary | ICD-10-CM

## 2016-02-23 DIAGNOSIS — E782 Mixed hyperlipidemia: Secondary | ICD-10-CM | POA: Diagnosis not present

## 2016-02-23 DIAGNOSIS — N950 Postmenopausal bleeding: Secondary | ICD-10-CM

## 2016-02-23 MED ORDER — TRAZODONE HCL 50 MG PO TABS: ORAL_TABLET | ORAL | 1 refills | Status: DC

## 2016-02-23 MED ORDER — FUROSEMIDE 40 MG PO TABS: ORAL_TABLET | ORAL | 1 refills | Status: DC

## 2016-02-23 MED ORDER — OMEPRAZOLE 40 MG PO CPDR: DELAYED_RELEASE_CAPSULE | ORAL | 1 refills | Status: DC

## 2016-02-23 MED ORDER — ESCITALOPRAM OXALATE 10 MG PO TABS: ORAL_TABLET | ORAL | 1 refills | Status: DC

## 2016-02-23 MED ORDER — ATORVASTATIN CALCIUM 40 MG PO TABS: ORAL_TABLET | ORAL | 1 refills | Status: DC

## 2016-02-23 MED ORDER — PRAMIPEXOLE DIHYDROCHLORIDE 0.5 MG PO TABS: 0.5000 mg | ORAL_TABLET | Freq: Three times a day (TID) | ORAL | 1 refills | Status: DC

## 2016-02-23 MED ORDER — FENOFIBRIC ACID 135 MG PO CPDR: 1.0000 | DELAYED_RELEASE_CAPSULE | Freq: Every day | ORAL | 1 refills | Status: DC

## 2016-02-23 NOTE — Addendum Note (Signed)
Addended by: Chevis Pretty on: 02/23/2016 04:16 PM   Modules accepted: Orders

## 2016-02-23 NOTE — Progress Notes (Signed)
   Subjective:    Patient ID: Stacie Solis, female    DOB: Aug 25, 1960, 55 y.o.   MRN: [id number]  HPI Patient in today for endometrial biopsy- she has had post menopausal bleeding intermittently - she was given provera for 10 days which made her bleed some.     Review of Systems  Constitutional: Negative.   Respiratory: Negative.   Cardiovascular: Negative.   Genitourinary: Negative.   Neurological: Negative.   Psychiatric/Behavioral: Negative.   All other systems reviewed and are negative.      Objective:   Physical Exam  Constitutional: She is oriented to person, place, and time. She appears well-developed and well-nourished. No distress.  Cardiovascular: Normal rate, regular rhythm and normal heart sounds.   Pulmonary/Chest: Effort normal.  Genitourinary:  Genitourinary Comments: Cervix parous and pink No adnexal mass or tenderness.  Neurological: She is alert and oriented to person, place, and time.  Skin: Skin is warm.  Psychiatric: She has a normal mood and affect. Her behavior is normal. Judgment and thought content normal.    BP 121/75   Pulse 80   Temp 97.7 F (36.5 C) (Oral)   Ht 5\' 2"  (1.575 m)   Wt 219 lb (99.3 kg)   BMI 40.06 kg/m   Procedure:  Lithotomy position  Large speculum  Single tooth tenacculum to ant lip of cervix  Pap obtained with spatula  endometial bx obtained  tenacculum removed  Taken out of lithotomy position  PATIENT TOLERATED WELL.    Assessment & Plan:  1. Postmenopausal bleeding May cramp for several hours May bleed for up to 24 hours Call if have questions Will talk once pathology report is back  [name], FNP  - Pathology - IGP, Aptima HPV, rfx 16/18,45

## 2016-02-27 LAB — IGP, APTIMA HPV, RFX 16/18,45
HPV APTIMA: NEGATIVE
PAP Smear Comment: 0

## 2016-02-28 LAB — PATHOLOGY

## 2016-03-01 ENCOUNTER — Telehealth: Payer: Self-pay | Admitting: Nurse Practitioner

## 2016-03-01 MED ORDER — PRAMIPEXOLE DIHYDROCHLORIDE 0.5 MG PO TABS: 0.5000 mg | ORAL_TABLET | Freq: Three times a day (TID) | ORAL | 1 refills | Status: DC

## 2016-03-01 NOTE — Telephone Encounter (Signed)
90 day supply sent over to the pharmacy and pt is aware.

## 2016-03-21 DIAGNOSIS — H25811 Combined forms of age-related cataract, right eye: Secondary | ICD-10-CM | POA: Diagnosis not present

## 2016-03-21 DIAGNOSIS — Z01818 Encounter for other preprocedural examination: Secondary | ICD-10-CM | POA: Diagnosis not present

## 2016-03-29 DIAGNOSIS — H2511 Age-related nuclear cataract, right eye: Secondary | ICD-10-CM | POA: Diagnosis not present

## 2016-03-29 DIAGNOSIS — H25811 Combined forms of age-related cataract, right eye: Secondary | ICD-10-CM | POA: Diagnosis not present

## 2016-04-18 ENCOUNTER — Other Ambulatory Visit: Payer: Self-pay | Admitting: Nurse Practitioner

## 2016-04-19 DIAGNOSIS — H25812 Combined forms of age-related cataract, left eye: Secondary | ICD-10-CM | POA: Diagnosis not present

## 2016-04-19 DIAGNOSIS — H2512 Age-related nuclear cataract, left eye: Secondary | ICD-10-CM | POA: Diagnosis not present

## 2016-04-30 ENCOUNTER — Other Ambulatory Visit: Payer: Self-pay | Admitting: Family Medicine

## 2016-05-03 DIAGNOSIS — M5441 Lumbago with sciatica, right side: Secondary | ICD-10-CM | POA: Diagnosis not present

## 2016-05-03 DIAGNOSIS — M47816 Spondylosis without myelopathy or radiculopathy, lumbar region: Secondary | ICD-10-CM | POA: Diagnosis not present

## 2016-05-03 DIAGNOSIS — G8929 Other chronic pain: Secondary | ICD-10-CM | POA: Diagnosis not present

## 2016-05-17 ENCOUNTER — Ambulatory Visit (INDEPENDENT_AMBULATORY_CARE_PROVIDER_SITE_OTHER): Payer: 59 | Admitting: Family

## 2016-05-17 ENCOUNTER — Encounter: Payer: Self-pay | Admitting: Family

## 2016-05-17 VITALS — BP 123/78 | HR 80 | Temp 97.1°F | Ht 62.0 in | Wt 216.0 lb

## 2016-05-17 DIAGNOSIS — F17200 Nicotine dependence, unspecified, uncomplicated: Secondary | ICD-10-CM

## 2016-05-17 DIAGNOSIS — J019 Acute sinusitis, unspecified: Secondary | ICD-10-CM | POA: Diagnosis not present

## 2016-05-17 MED ORDER — BENZONATATE 200 MG PO CAPS
200.0000 mg | ORAL_CAPSULE | Freq: Three times a day (TID) | ORAL | 1 refills | Status: DC | PRN
Start: 2016-05-17 — End: 2016-09-10

## 2016-05-17 MED ORDER — HYDROCODONE-HOMATROPINE 5-1.5 MG/5ML PO SYRP: 5.0000 mL | ORAL_SOLUTION | Freq: Three times a day (TID) | ORAL | 0 refills | Status: DC | PRN

## 2016-05-17 MED ORDER — AMOXICILLIN-POT CLAVULANATE 875-125 MG PO TABS: 1.0000 | ORAL_TABLET | Freq: Two times a day (BID) | ORAL | 0 refills | Status: DC

## 2016-05-17 NOTE — Patient Instructions (Signed)

## 2016-05-17 NOTE — Progress Notes (Signed)
Subjective:    Patient ID: Stacie Solis, female    DOB: February 18, 1961, 56 y.o.   MRN: [id number]  Sinus Problem  This is a new problem. The current episode started 1 to 4 weeks ago. The problem has been gradually worsening since onset. There has been no fever. Her pain is at a severity of 7/10. The pain is moderate. Associated symptoms include chills, congestion, coughing, ear pain, headaches, a hoarse voice, shortness of breath, sinus pressure and sneezing. Pertinent negatives include no sore throat. Past treatments include oral decongestants, lying down, spray decongestants and sitting up. The treatment provided mild relief.      Review of Systems  Constitutional: Positive for chills.  HENT: Positive for congestion, ear pain, hoarse voice, sinus pressure and sneezing. Negative for sore throat.   Respiratory: Positive for cough and shortness of breath.   Neurological: Positive for headaches.  All other systems reviewed and are negative.      Objective:   Physical Exam  Constitutional: She is oriented to person, place, and time. She appears well-developed and well-nourished. No distress.  HENT:  Head: Normocephalic and atraumatic.  Right Ear: External ear normal.  Left Ear: There is tenderness. Tympanic membrane is bulging.  Nose: Mucosal edema and rhinorrhea present. Right sinus exhibits maxillary sinus tenderness and frontal sinus tenderness. Left sinus exhibits maxillary sinus tenderness and frontal sinus tenderness.  Mouth/Throat: Posterior oropharyngeal erythema present.  Eyes: Pupils are equal, round, and reactive to light.  Neck: Normal range of motion. Neck supple. No thyromegaly present.  Cardiovascular: Normal rate, regular rhythm, normal heart sounds and intact distal pulses.   No murmur heard. Pulmonary/Chest: Effort normal and breath sounds normal. No respiratory distress. She has no wheezes.  Abdominal: Soft. Bowel sounds are normal. She exhibits no distension. There is  no tenderness.  Musculoskeletal: Normal range of motion. She exhibits no edema or tenderness.  Neurological: She is alert and oriented to person, place, and time.  Skin: Skin is warm and dry.  Psychiatric: She has a normal mood and affect. Her behavior is normal. Judgment and thought content normal.  Vitals reviewed.     BP 123/78   Pulse 80   Temp 97.1 F (36.2 C) (Oral)   Ht 5\' 2"  (1.575 m)   Wt 216 lb (98 kg)   BMI 39.51 kg/m      Assessment & Plan:  1. Current smoker  2. Acute sinusitis, recurrence not specified, unspecified location - Take meds as prescribed - Use a cool mist humidifier  -Use saline nose sprays frequently -Saline irrigations of the nose can be very helpful if done frequently.  * 4X daily for 1 week*  * Use of a nettie pot can be helpful with this. Follow directions with this* -Force fluids -For any cough or congestion  Use plain Mucinex- regular strength or max strength is fine   * Children- consult with Pharmacist for dosing -For fever or aces or pains- take tylenol or ibuprofen appropriate for age and weight.  * for fevers greater than 101 orally you may alternate ibuprofen and tylenol every  3 hours. -Throat lozenges if help - amoxicillin-clavulanate (AUGMENTIN) 875-125 MG tablet; Take 1 tablet by mouth 2 (two) times daily.  Dispense: 14 tablet; Refill: 0 - benzonatate (TESSALON) 200 MG capsule; Take 1 capsule (200 mg total) by mouth 3 (three) times daily as needed.  Dispense: 30 capsule; Refill: 1 - HYDROcodone-homatropine (HYCODAN) 5-1.5 MG/5ML syrup; Take 5 mLs by mouth every 8 (eight)  hours as needed for cough.  Dispense: 120 mL; Refill: 0  Evelina Dun, FNP

## 2016-07-09 ENCOUNTER — Other Ambulatory Visit: Payer: Self-pay | Admitting: Nurse Practitioner

## 2016-08-13 ENCOUNTER — Other Ambulatory Visit: Payer: Self-pay | Admitting: Nurse Practitioner

## 2016-08-27 ENCOUNTER — Other Ambulatory Visit: Payer: Self-pay | Admitting: Family

## 2016-09-10 ENCOUNTER — Encounter: Payer: Self-pay | Admitting: Family Medicine

## 2016-09-10 ENCOUNTER — Ambulatory Visit (INDEPENDENT_AMBULATORY_CARE_PROVIDER_SITE_OTHER): Payer: 59 | Admitting: Family Medicine

## 2016-09-10 VITALS — BP 119/74 | HR 80 | Temp 97.2°F | Ht 62.0 in | Wt 212.6 lb

## 2016-09-10 DIAGNOSIS — F17200 Nicotine dependence, unspecified, uncomplicated: Secondary | ICD-10-CM | POA: Diagnosis not present

## 2016-09-10 DIAGNOSIS — J0100 Acute maxillary sinusitis, unspecified: Secondary | ICD-10-CM

## 2016-09-10 MED ORDER — METHYLPREDNISOLONE ACETATE 80 MG/ML IJ SUSP
80.0000 mg | Freq: Once | INTRAMUSCULAR | Status: AC
  Administered 2016-09-10: 80 mg via INTRAMUSCULAR

## 2016-09-10 MED ORDER — FLUCONAZOLE 150 MG PO TABS: ORAL_TABLET | ORAL | 0 refills | Status: DC

## 2016-09-10 MED ORDER — AMOXICILLIN-POT CLAVULANATE 875-125 MG PO TABS: 1.0000 | ORAL_TABLET | Freq: Two times a day (BID) | ORAL | 0 refills | Status: DC

## 2016-09-10 NOTE — Addendum Note (Signed)
Addended by: Timmothy Euler on: 09/10/2016 10:54 AM   Modules accepted: Orders

## 2016-09-10 NOTE — Progress Notes (Signed)
   HPI  Patient presents today here with concern for sinusitis.  Patient explains that she's had symptoms for about 3 weeks including bilateral Sinus pain and pressure, ear pain, and over the last few days developed a left-sided cervical lymph node.  She also has cough, she is a smoker, not interested in quitting.  She also requests refill of Hycodan cough.  PMH: Smoking status noted ROS: Per HPI  Objective: BP 119/74   Pulse 80   Temp (!) 97.2 F (36.2 C) (Oral)   Ht 5\' 2"  (1.575 m)   Wt 212 lb 9.6 oz (96.4 kg)   BMI 38.89 kg/m  Gen: NAD, alert, cooperative with exam HEENT: NCAT, positive bilateral tenderness in the maxillary sinuses, TMs normal bilaterally CV: RRR, good S1/S2, no murmur Resp: CTABL, no wheezes, non-labored Ext: No edema, warm Neuro: Alert and oriented, No gross deficits  Assessment and plan:  # Acute maxillary sinusitis Treat with Augmentin Patient also with cough, I declined her request for Hycodan due to being treated with hydrocodone chronically from orthopedics. IM Depo-Medrol instead. Continue Mucinex DM as needed  # Smoker Recommended quitting, she will consider, not ready to quit at this time.   Meds ordered this encounter  Medications  . amoxicillin-clavulanate (AUGMENTIN) 875-125 MG tablet    Sig: Take 1 tablet by mouth 2 (two) times daily.    Dispense:  20 tablet    Refill:  0    Laroy Apple, MD [facility] 09/10/2016, 10:27 AM

## 2016-09-10 NOTE — Patient Instructions (Signed)
Great to see you!  Be sure to take all antibiotics    Sinusitis, Adult Sinusitis is soreness and inflammation of your sinuses. Sinuses are hollow spaces in the bones around your face. They are located:  Around your eyes.  In the middle of your forehead.  Behind your nose.  In your cheekbones.  Your sinuses and nasal passages are lined with a stringy fluid (mucus). Mucus normally drains out of your sinuses. When your nasal tissues get inflamed or swollen, the mucus can get trapped or blocked so air cannot flow through your sinuses. This lets bacteria, viruses, and funguses grow, and that leads to infection. Follow these instructions at home: Medicines  Take, use, or apply over-the-counter and prescription medicines only as told by your doctor. These may include nasal sprays.  If you were prescribed an antibiotic medicine, take it as told by your doctor. Do not stop taking the antibiotic even if you start to feel better. Hydrate and Humidify  Drink enough water to keep your pee (urine) clear or pale yellow.  Use a cool mist humidifier to keep the humidity level in your home above 50%.  Breathe in steam for 10-15 minutes, 3-4 times a day or as told by your doctor. You can do this in the bathroom while a hot shower is running.  Try not to spend time in cool or dry air. Rest  Rest as much as possible.  Sleep with your head raised (elevated).  Make sure to get enough sleep each night. General instructions  Put a warm, moist washcloth on your face 3-4 times a day or as told by your doctor. This will help with discomfort.  Wash your hands often with soap and water. If there is no soap and water, use hand sanitizer.  Do not smoke. Avoid being around people who are smoking (secondhand smoke).  Keep all follow-up visits as told by your doctor. This is important. Contact a doctor if:  You have a fever.  Your symptoms get worse.  Your symptoms do not get better within 10  days. Get help right away if:  You have a very bad headache.  You cannot stop throwing up (vomiting).  You have pain or swelling around your face or eyes.  You have trouble seeing.  You feel confused.  Your neck is stiff.  You have trouble breathing. This information is not intended to replace advice given to you by your health care provider. Make sure you discuss any questions you have with your health care provider. Document Released: 08/01/2007 Document Revised: 10/09/2015 Document Reviewed: 12/08/2014 Elsevier Interactive Patient Education  [employer].

## 2016-09-14 ENCOUNTER — Other Ambulatory Visit: Payer: Self-pay | Admitting: Nurse Practitioner

## 2016-09-14 NOTE — Telephone Encounter (Signed)
Last refill without being seen 

## 2016-09-14 NOTE — Telephone Encounter (Signed)
Last lipid 12/23/15  MMM

## 2016-09-17 ENCOUNTER — Telehealth: Payer: Self-pay | Admitting: Family Medicine

## 2016-09-17 MED ORDER — BENZONATATE 100 MG PO CAPS: 100.0000 mg | ORAL_CAPSULE | Freq: Two times a day (BID) | ORAL | 0 refills | Status: DC | PRN

## 2016-09-17 NOTE — Telephone Encounter (Signed)
Needs to be seen if not better after treatment  Tessalon sent for symptomatic relief.   Laroy Apple, MD [facility] 09/17/2016, 5:05 PM

## 2016-09-17 NOTE — Telephone Encounter (Signed)
lmtcb jkp 7/23

## 2016-09-17 NOTE — Telephone Encounter (Signed)
Patient reports she still has a dry, hacking cough which worsens at night.  She is still on the antibiotic.  Would like to know if you will send in something for the cough to [facility].

## 2016-09-21 DIAGNOSIS — M5441 Lumbago with sciatica, right side: Secondary | ICD-10-CM | POA: Diagnosis not present

## 2016-09-21 DIAGNOSIS — G8929 Other chronic pain: Secondary | ICD-10-CM | POA: Diagnosis not present

## 2016-09-25 NOTE — Telephone Encounter (Signed)
Tried to call patient, fast busy signal

## 2016-10-03 DIAGNOSIS — M5441 Lumbago with sciatica, right side: Secondary | ICD-10-CM | POA: Diagnosis not present

## 2016-10-15 ENCOUNTER — Other Ambulatory Visit: Payer: Self-pay | Admitting: Nurse Practitioner

## 2016-11-09 ENCOUNTER — Other Ambulatory Visit: Payer: Self-pay | Admitting: Nurse Practitioner

## 2016-11-11 ENCOUNTER — Other Ambulatory Visit: Payer: Self-pay | Admitting: Nurse Practitioner

## 2016-11-13 ENCOUNTER — Other Ambulatory Visit: Payer: Self-pay | Admitting: Nurse Practitioner

## 2016-12-08 ENCOUNTER — Telehealth: Payer: Self-pay | Admitting: Nurse Practitioner

## 2016-12-08 MED ORDER — DOXYCYCLINE HYCLATE 100 MG PO TABS: 100.0000 mg | ORAL_TABLET | Freq: Two times a day (BID) | ORAL | 0 refills | Status: DC

## 2016-12-08 NOTE — Telephone Encounter (Signed)
Small scratch on anterior lower leg 4 days ago from cardboard box. Developed redness, warmth, swelling and discomfort over shin. Denies calf pain and no pain with foot flexion or calf flexion.   Discussed with Particia Nearing, PA-C.   Doxycyline sent to pharmacy and f/u appt scheduled with PCP next week. Patient aware.

## 2016-12-08 NOTE — Telephone Encounter (Signed)
What symptoms do you have? Fluid in leg, she is taking a fluid pill started turning red hurts when she walks  How long have you been sick? Since yesterday   Have you been seen for this problem? Yes in the past If your provider decides to give you a prescription, which pharmacy would you like for it to be sent to?  [facility]  Patient informed that this information will be sent to the clinical staff for review and that they should receive a follow up call.

## 2016-12-12 ENCOUNTER — Other Ambulatory Visit: Payer: Self-pay | Admitting: Nurse Practitioner

## 2016-12-12 ENCOUNTER — Ambulatory Visit: Payer: 59 | Admitting: Nurse Practitioner

## 2016-12-12 DIAGNOSIS — E782 Mixed hyperlipidemia: Secondary | ICD-10-CM

## 2016-12-13 ENCOUNTER — Other Ambulatory Visit: Payer: Self-pay | Admitting: Nurse Practitioner

## 2016-12-13 NOTE — Telephone Encounter (Signed)
Patient NTBS for follow up and lab work  

## 2016-12-14 NOTE — Telephone Encounter (Signed)
appt made

## 2016-12-18 ENCOUNTER — Encounter: Payer: Self-pay | Admitting: Nurse Practitioner

## 2016-12-18 ENCOUNTER — Ambulatory Visit (INDEPENDENT_AMBULATORY_CARE_PROVIDER_SITE_OTHER): Payer: 59 | Admitting: Nurse Practitioner

## 2016-12-18 VITALS — BP 136/86 | Temp 97.4°F | Ht 62.0 in | Wt 217.2 lb

## 2016-12-18 DIAGNOSIS — R609 Edema, unspecified: Secondary | ICD-10-CM | POA: Diagnosis not present

## 2016-12-18 DIAGNOSIS — E782 Mixed hyperlipidemia: Secondary | ICD-10-CM

## 2016-12-18 DIAGNOSIS — M7711 Lateral epicondylitis, right elbow: Secondary | ICD-10-CM | POA: Diagnosis not present

## 2016-12-18 DIAGNOSIS — M545 Low back pain: Secondary | ICD-10-CM

## 2016-12-18 DIAGNOSIS — K219 Gastro-esophageal reflux disease without esophagitis: Secondary | ICD-10-CM | POA: Diagnosis not present

## 2016-12-18 DIAGNOSIS — F17200 Nicotine dependence, unspecified, uncomplicated: Secondary | ICD-10-CM | POA: Diagnosis not present

## 2016-12-18 DIAGNOSIS — F3342 Major depressive disorder, recurrent, in full remission: Secondary | ICD-10-CM | POA: Diagnosis not present

## 2016-12-18 DIAGNOSIS — G500 Trigeminal neuralgia: Secondary | ICD-10-CM | POA: Diagnosis not present

## 2016-12-18 DIAGNOSIS — G8929 Other chronic pain: Secondary | ICD-10-CM

## 2016-12-18 DIAGNOSIS — F5101 Primary insomnia: Secondary | ICD-10-CM

## 2016-12-18 DIAGNOSIS — Z6835 Body mass index (BMI) 35.0-35.9, adult: Secondary | ICD-10-CM

## 2016-12-18 DIAGNOSIS — F410 Panic disorder [episodic paroxysmal anxiety] without agoraphobia: Secondary | ICD-10-CM | POA: Diagnosis not present

## 2016-12-18 DIAGNOSIS — G2581 Restless legs syndrome: Secondary | ICD-10-CM

## 2016-12-18 MED ORDER — TRAZODONE HCL 100 MG PO TABS: 100.0000 mg | ORAL_TABLET | Freq: Every day | ORAL | 5 refills | Status: DC

## 2016-12-18 MED ORDER — PRAMIPEXOLE DIHYDROCHLORIDE 0.5 MG PO TABS: 0.5000 mg | ORAL_TABLET | Freq: Three times a day (TID) | ORAL | 1 refills | Status: DC

## 2016-12-18 MED ORDER — FUROSEMIDE 40 MG PO TABS: 40.0000 mg | ORAL_TABLET | Freq: Every day | ORAL | 1 refills | Status: DC

## 2016-12-18 MED ORDER — ESCITALOPRAM OXALATE 10 MG PO TABS: ORAL_TABLET | ORAL | 1 refills | Status: DC

## 2016-12-18 MED ORDER — FENOFIBRIC ACID 135 MG PO CPDR: 1.0000 | DELAYED_RELEASE_CAPSULE | Freq: Every day | ORAL | 1 refills | Status: DC

## 2016-12-18 MED ORDER — ATORVASTATIN CALCIUM 40 MG PO TABS: ORAL_TABLET | ORAL | 1 refills | Status: DC

## 2016-12-18 MED ORDER — OMEPRAZOLE 40 MG PO CPDR: DELAYED_RELEASE_CAPSULE | ORAL | 1 refills | Status: DC

## 2016-12-18 NOTE — Progress Notes (Signed)
Subjective:    Patient ID: Stacie Solis, female    DOB: 06/11/1960, 56 y.o.   MRN: [id number]  HPI   Stacie Solis is here today for follow up of chronic medical problem.  Outpatient Encounter Prescriptions as of 12/18/2016  Medication Sig  . atorvastatin (LIPITOR) 40 MG tablet TAKE 1 TABLET (40 MG TOTAL) BY MOUTH DAILY.  Marland Kitchen atorvastatin (LIPITOR) 40 MG tablet TAKE 1 TABLET (40 MG TOTAL) BY MOUTH DAILY.  . cholecalciferol (VITAMIN D) 1000 UNITS tablet Take 1,000 Units by mouth daily.  . Choline Fenofibrate (FENOFIBRIC ACID) 135 MG CPDR Take 1 capsule by mouth daily.  . cyclobenzaprine (FLEXERIL) 10 MG tablet TAKE 1 TABLET BY MOUTH THREE TIMES A DAY AS NEEDED FOR MUSCLE SPASMS  . doxycycline (VIBRA-TABS) 100 MG tablet Take 1 tablet (100 mg total) by mouth 2 (two) times daily.  Marland Kitchen escitalopram (LEXAPRO) 10 MG tablet TAKE 1 TABLET (10 MG TOTAL) BY MOUTH DAILY.  . fish oil-omega-3 fatty acids 1000 MG capsule Take 2 g by mouth 2 (two) times daily.  . fluconazole (DIFLUCAN) 150 MG tablet Take one pill and repeat in 3 days  . furosemide (LASIX) 40 MG tablet TAKE 1 TABLET BY MOUTH EVERY DAY  . furosemide (LASIX) 40 MG tablet TAKE 1 TABLET BY MOUTH EVERY DAY  . omeprazole (PRILOSEC) 40 MG capsule TAKE 1 CAPSULE (40 MG TOTAL) BY MOUTH DAILY.  Marland Kitchen pramipexole (MIRAPEX) 0.5 MG tablet Take 1 tablet (0.5 mg total) by mouth 3 (three) times daily.  Marland Kitchen PROAIR HFA 108 (90 Base) MCG/ACT inhaler USE 2 PUFFS EVERY 4 HOURS AS NEEDED  . traZODone (DESYREL) 50 MG tablet 1 TABLETS BY MOUTH AT BEDTIME AS NEEDED FOR SLEEP  . traZODone (DESYREL) 50 MG tablet TAKE 1/2-1 TABLETS BY MOUTH AT BEDTIME AS NEEDED FOR SLEEP  . Triamcinolone Acetonide (NASACORT AQ NA) Place into the nose.  . Wheat Dextrin (BENEFIBER) TABS Take by mouth.     1. Gastroesophageal reflux disease without esophagitis  Symptoms managed with omeprazole daily.  No concerns.  2. Mixed hyperlipidemia  Patient taking atorvastatin.  LFTs monitored  regularly.  3. Panic attacks  Symptoms managed with daily lexapro.  4. Recurrent major depressive disorder, in full remission ([place])  Daily lexapro.  No current complaints.  5. Smoker  Patient continues to smoke.  No desire to quit at this time.  6. BMI 35.0-35.9,adult  No significant weight gain or loss.  7. RLS (restless legs syndrome)  Patient takes flexeril to help with symptoms.  8. Primary insomnia  Patient taking 50 mg trazodone night.  She states since her last visit she is having to take 2 pills (100 mg) about every other night to stay asleep.  9. Chronic midline low back pain without sciatica Managed with flexeril for muscle spasm.   10. Trigeminal neuralgia     New complaints: Patient wants to have her leg looked at today.  She called in on 12/08/16 with a small scratch on the right leg that developed warmth and redness and swelling to the lower extremity.  Doxycycline called into pharmacy and she has 2-3 days remaining of prescription.  She is also interested in getting a steroid injection in her right elbow for inflammation/tendonitis.  She states her son is getting married in a couple days and she doesn't want any problems with her elbow over the weekend.  Social history: Son getting married 12/21/16.   Review of Systems  Constitutional: Negative for activity change and  appetite change.  Respiratory: Negative for cough and shortness of breath.   Cardiovascular: Positive for leg swelling (RLE). Negative for chest pain and palpitations.  Endocrine: Negative for polydipsia, polyphagia and polyuria.  Neurological: Negative for dizziness and light-headedness.  Psychiatric/Behavioral: Positive for sleep disturbance (needing more trazodone to stay asleep).  All other systems reviewed and are negative.      Objective:   Physical Exam  Constitutional: She is oriented to person, place, and time. She appears well-developed and well-nourished. No distress.  HENT:  Head:  Normocephalic.  Right Ear: External ear normal.  Left Ear: External ear normal.  Mouth/Throat: Oropharynx is clear and moist.  Eyes: Pupils are equal, round, and reactive to light.  Neck: Normal range of motion. Neck supple. No thyromegaly present.  Cardiovascular: Normal rate, regular rhythm, normal heart sounds and intact distal pulses.   No murmur heard. Pulmonary/Chest: Effort normal. No respiratory distress. She has rales (right fields > left).  Abdominal: Soft. Bowel sounds are normal. She exhibits no distension. There is no tenderness.  Musculoskeletal: Normal range of motion. She exhibits edema (+1 pitting in LLE, +2 in RLE) and tenderness (right elbow with extension).  Pain along lateral epicondyle of right elbow- FROM with pain on making a fist.  Lymphadenopathy:    She has no cervical adenopathy.  Neurological: She is alert and oriented to person, place, and time.  Skin: Skin is warm and dry.  Psychiatric: She has a normal mood and affect. Her behavior is normal.   BP 136/86   Temp (!) 97.4 F (36.3 C) (Oral)   Ht _0  (1.575 m)   Wt 217 lb 3.2 oz (98.5 kg)   BMI 39.73 kg/m      Assessment & Plan:  1. Gastroesophageal reflux disease without esophagitis Avoid spicy foods Do not eat 2 hours prior to bedtime - omeprazole (PRILOSEC) 40 MG capsule; TAKE 1 CAPSULE (40 MG TOTAL) BY MOUTH DAILY.  Dispense: 90 capsule; Refill: 1  2. Mixed hyperlipidemia Low fat diet - Choline Fenofibrate (FENOFIBRIC ACID) 135 MG CPDR; Take 1 capsule by mouth daily.  Dispense: 90 capsule; Refill: 1 - atorvastatin (LIPITOR) 40 MG tablet; TAKE 1 TABLET (40 MG TOTAL) BY MOUTH DAILY.  Dispense: 90 tablet; Refill: 1 - CMP14+EGFR - Lipid panel  3. Panic attacks Stress manageemnt  4. Recurrent major depressive disorder, in full remission ([place]) Again stress management encourgaed - escitalopram (LEXAPRO) 10 MG tablet; TAKE 1 TABLET (10 MG TOTAL) BY MOUTH DAILY.  Dispense: 90 tablet; Refill:  1  5. Smoker Smoking cessation encourgaed  6. BMI 35.0-35.9,adult Discussed diet and exercise for person with BMI >25 Will recheck weight in 3-6 months  7. RLS (restless legs syndrome) Keep legs warm at night - pramipexole (MIRAPEX) 0.5 MG tablet; Take 1 tablet (0.5 mg total) by mouth 3 (three) times daily.  Dispense: 270 tablet; Refill: 1  8. Primary insomnia Bedtime routine Increase trazadone from 43m to 100 noghtly - traZODone (DESYREL) 100 MG tablet; Take 1 tablet (100 mg total) by mouth at bedtime.  Dispense: 30 tablet; Refill: 5  9. Chronic midline low back pain without sciatica Moist heat No heavy lifting  10. Trigeminal neuralgia Follow up with neurologist as needed  11. Right lateral epicondylitis Tennis elbow strap 24/7 for at least 3-4 weeks  12. Peripheral edema Elevate legs when sitting - furosemide (LASIX) 40 MG tablet; Take 1 tablet (40 mg total) by mouth daily.  Dispense: 90 tablet; Refill: 1    Labs  pending Health maintenance reviewed Diet and exercise encouraged Continue all meds Follow up  In 6 months   [place], FNP

## 2016-12-18 NOTE — Patient Instructions (Signed)
Stress and Stress Management Stress is a normal reaction to life events. It is what you feel when life demands more than you are used to or more than you can handle. Some stress can be useful. For example, the stress reaction can help you catch the last bus of the day, study for a test, or meet a deadline at work. But stress that occurs too often or for too long can cause problems. It can affect your emotional health and interfere with relationships and normal daily activities. Too much stress can weaken your immune system and increase your risk for physical illness. If you already have a medical problem, stress can make it worse. What are the causes? All sorts of life events may cause stress. An event that causes stress for one person may not be stressful for another person. Major life events commonly cause stress. These may be positive or negative. Examples include losing your job, moving into a new home, getting married, having a baby, or losing a loved one. Less obvious life events may also cause stress, especially if they occur day after day or in combination. Examples include working long hours, driving in traffic, caring for children, being in debt, or being in a difficult relationship. What are the signs or symptoms? Stress may cause emotional symptoms including, the following:  Anxiety. This is feeling worried, afraid, on edge, overwhelmed, or out of control.  Anger. This is feeling irritated or impatient.  Depression. This is feeling sad, down, helpless, or guilty.  Difficulty focusing, remembering, or making decisions.  Stress may cause physical symptoms, including the following:  Aches and pains. These may affect your head, neck, back, stomach, or other areas of your body.  Tight muscles or clenched jaw.  Low energy or trouble sleeping.  Stress may cause unhealthy behaviors, including the following:  Eating to feel better (overeating) or skipping meals.  Sleeping too little,  too much, or both.  Working too much or putting off tasks (procrastination).  Smoking, drinking alcohol, or using drugs to feel better.  How is this diagnosed? Stress is diagnosed through an assessment by your health care provider. Your health care provider will ask questions about your symptoms and any stressful life events.Your health care provider will also ask about your medical history and may order blood tests or other tests. Certain medical conditions and medicine can cause physical symptoms similar to stress. Mental illness can cause emotional symptoms and unhealthy behaviors similar to stress. Your health care provider may refer you to a mental health professional for further evaluation. How is this treated? Stress management is the recommended treatment for stress.The goals of stress management are reducing stressful life events and coping with stress in healthy ways. Techniques for reducing stressful life events include the following:  Stress identification. Self-monitor for stress and identify what causes stress for you. These skills may help you to avoid some stressful events.  Time management. Set your priorities, keep a calendar of events, and learn to say "no." These tools can help you avoid making too many commitments.  Techniques for coping with stress include the following:  Rethinking the problem. Try to think realistically about stressful events rather than ignoring them or overreacting. Try to find the positives in a stressful situation rather than focusing on the negatives.  Exercise. Physical exercise can release both physical and emotional tension. The key is to find a form of exercise you enjoy and do it regularly.  Relaxation techniques. These relax the body and  mind. Examples include yoga, meditation, tai chi, biofeedback, deep breathing, progressive muscle relaxation, listening to music, being out in nature, journaling, and other hobbies. Again, the key is to find  one or more that you enjoy and can do regularly.  Healthy lifestyle. Eat a balanced diet, get plenty of sleep, and do not smoke. Avoid using alcohol or drugs to relax.  Strong support network. Spend time with family, friends, or other people you enjoy being around.Express your feelings and talk things over with someone you trust.  Counseling or talktherapy with a mental health professional may be helpful if you are having difficulty managing stress on your own. Medicine is typically not recommended for the treatment of stress.Talk to your health care provider if you think you need medicine for symptoms of stress. Follow these instructions at home:  Keep all follow-up visits as directed by your health care provider.  Take all medicines as directed by your health care provider. Contact a health care provider if:  Your symptoms get worse or you start having new symptoms.  You feel overwhelmed by your problems and can no longer manage them on your own. Get help right away if:  You feel like hurting yourself or someone else. This information is not intended to replace advice given to you by your health care provider. Make sure you discuss any questions you have with your health care provider. Document Released: 08/08/2000 Document Revised: 07/21/2015 Document Reviewed: 10/07/2012 Elsevier Interactive Patient Education  2017 Else[employer] Inc[employer]  

## 2016-12-19 LAB — CMP14+EGFR
ALT: 31 IU/L (ref 0–32)
AST: 27 IU/L (ref 0–40)
Albumin/Globulin Ratio: 2 (ref 1.2–2.2)
Albumin: 4.6 g/dL (ref 3.5–5.5)
Alkaline Phosphatase: 72 IU/L (ref 39–117)
BUN/Creatinine Ratio: 11 (ref 9–23)
BUN: 11 mg/dL (ref 6–24)
Bilirubin Total: 0.5 mg/dL (ref 0.0–1.2)
CO2: 25 mmol/L (ref 20–29)
Calcium: 9.3 mg/dL (ref 8.7–10.2)
Chloride: 101 mmol/L (ref 96–106)
Creatinine, Ser: 1.02 mg/dL — ABNORMAL HIGH (ref 0.57–1.00)
GFR calc Af Amer: 70 mL/min/1.73
GFR calc non Af Amer: 61 mL/min/1.73
Globulin, Total: 2.3 g/dL (ref 1.5–4.5)
Glucose: 110 mg/dL — ABNORMAL HIGH (ref 65–99)
Potassium: 4.4 mmol/L (ref 3.5–5.2)
Sodium: 142 mmol/L (ref 134–144)
Total Protein: 6.9 g/dL (ref 6.0–8.5)

## 2016-12-19 LAB — LIPID PANEL
CHOL/HDL RATIO: 4 ratio (ref 0.0–4.4)
Cholesterol, Total: 113 mg/dL (ref 100–199)
HDL: 28 mg/dL — AB (ref >39)
LDL Calculated: 58 mg/dL (ref 0–99)
Triglycerides: 137 mg/dL (ref 0–149)
VLDL Cholesterol Cal: 27 mg/dL (ref 5–40)

## 2017-01-07 ENCOUNTER — Other Ambulatory Visit: Payer: Self-pay | Admitting: Nurse Practitioner

## 2017-01-14 ENCOUNTER — Other Ambulatory Visit: Payer: Self-pay

## 2017-01-14 DIAGNOSIS — F5101 Primary insomnia: Secondary | ICD-10-CM

## 2017-01-14 MED ORDER — TRAZODONE HCL 100 MG PO TABS: 100.0000 mg | ORAL_TABLET | Freq: Every day | ORAL | 0 refills | Status: DC

## 2017-01-21 DIAGNOSIS — G8929 Other chronic pain: Secondary | ICD-10-CM | POA: Diagnosis not present

## 2017-01-21 DIAGNOSIS — M5441 Lumbago with sciatica, right side: Secondary | ICD-10-CM | POA: Diagnosis not present

## 2017-01-21 DIAGNOSIS — M5136 Other intervertebral disc degeneration, lumbar region: Secondary | ICD-10-CM | POA: Diagnosis not present

## 2017-01-21 DIAGNOSIS — Z79891 Long term (current) use of opiate analgesic: Secondary | ICD-10-CM | POA: Diagnosis not present

## 2017-01-24 DIAGNOSIS — M549 Dorsalgia, unspecified: Secondary | ICD-10-CM | POA: Diagnosis not present

## 2017-01-24 DIAGNOSIS — J209 Acute bronchitis, unspecified: Secondary | ICD-10-CM | POA: Diagnosis not present

## 2017-01-28 DIAGNOSIS — M5136 Other intervertebral disc degeneration, lumbar region: Secondary | ICD-10-CM | POA: Diagnosis not present

## 2017-01-30 DIAGNOSIS — M5136 Other intervertebral disc degeneration, lumbar region: Secondary | ICD-10-CM | POA: Diagnosis not present

## 2017-02-23 ENCOUNTER — Other Ambulatory Visit: Payer: Self-pay | Admitting: Nurse Practitioner

## 2017-03-19 ENCOUNTER — Encounter: Payer: Self-pay | Admitting: Family Medicine

## 2017-03-19 ENCOUNTER — Ambulatory Visit (INDEPENDENT_AMBULATORY_CARE_PROVIDER_SITE_OTHER): Payer: 59 | Admitting: Family Medicine

## 2017-03-19 VITALS — BP 129/69 | HR 80 | Temp 98.1°F | Ht 62.0 in | Wt 217.4 lb

## 2017-03-19 DIAGNOSIS — J0100 Acute maxillary sinusitis, unspecified: Secondary | ICD-10-CM

## 2017-03-19 MED ORDER — AMOXICILLIN-POT CLAVULANATE 875-125 MG PO TABS: 1.0000 | ORAL_TABLET | Freq: Two times a day (BID) | ORAL | 0 refills | Status: DC

## 2017-03-19 MED ORDER — PREDNISONE 20 MG PO TABS: 40.0000 mg | ORAL_TABLET | Freq: Every day | ORAL | 0 refills | Status: DC

## 2017-03-19 NOTE — Patient Instructions (Signed)
Great to see you!   Sinusitis, Adult Sinusitis is soreness and inflammation of your sinuses. Sinuses are hollow spaces in the bones around your face. They are located:  Around your eyes.  In the middle of your forehead.  Behind your nose.  In your cheekbones.  Your sinuses and nasal passages are lined with a stringy fluid (mucus). Mucus normally drains out of your sinuses. When your nasal tissues get inflamed or swollen, the mucus can get trapped or blocked so air cannot flow through your sinuses. This lets bacteria, viruses, and funguses grow, and that leads to infection. Follow these instructions at home: Medicines  Take, use, or apply over-the-counter and prescription medicines only as told by your doctor. These may include nasal sprays.  If you were prescribed an antibiotic medicine, take it as told by your doctor. Do not stop taking the antibiotic even if you start to feel better. Hydrate and Humidify  Drink enough water to keep your pee (urine) clear or pale yellow.  Use a cool mist humidifier to keep the humidity level in your home above 50%.  Breathe in steam for 10-15 minutes, 3-4 times a day or as told by your doctor. You can do this in the bathroom while a hot shower is running.  Try not to spend time in cool or dry air. Rest  Rest as much as possible.  Sleep with your head raised (elevated).  Make sure to get enough sleep each night. General instructions  Put a warm, moist washcloth on your face 3-4 times a day or as told by your doctor. This will help with discomfort.  Wash your hands often with soap and water. If there is no soap and water, use hand sanitizer.  Do not smoke. Avoid being around people who are smoking (secondhand smoke).  Keep all follow-up visits as told by your doctor. This is important. Contact a doctor if:  You have a fever.  Your symptoms get worse.  Your symptoms do not get better within 10 days. Get help right away if:  You  have a very bad headache.  You cannot stop throwing up (vomiting).  You have pain or swelling around your face or eyes.  You have trouble seeing.  You feel confused.  Your neck is stiff.  You have trouble breathing. This information is not intended to replace advice given to you by your health care provider. Make sure you discuss any questions you have with your health care provider. Document Released: 08/01/2007 Document Revised: 10/09/2015 Document Reviewed: 12/08/2014 Elsevier Interactive Patient Education  2018 [employer] Inc[employer]  

## 2017-03-19 NOTE — Progress Notes (Signed)
   HPI  Patient presents today here with concern for sinus infection.  Patient states she has been dealing with this illness for about 3 weeks, she has cough, congestion, sore throat, frontal headache, and mild shortness of breath.  She states that she has been using albuterol with some improvement.  She denies any chest pain. She is eating and drinking normally.  She states that azithromycin is not usually effective for her.   Her brother was recently diagnosed with pancreatic cancer and is currently undergoing chemotherapy  PMH: Smoking status noted ROS: Per HPI  Objective: BP 129/69   Pulse 80   Temp 98.1 F (36.7 C) (Oral)   Ht 5\' 2"  (1.575 m)   Wt 217 lb 6.4 oz (98.6 kg)   SpO2 96%   BMI 39.76 kg/m  Gen: NAD, alert, cooperative with exam HEENT: NCAT, oropharynx moist and clear, bilateral maxillary and frontal sinus tenderness to palpation, however right maxillary is the worst CV: RRR, good S1/S2 Resp: Nonlabored, good air movements, some scattered coarse breath sounds Abd: SNTND, BS present, no guarding or organomegaly Ext: No edema, warm Neuro: Alert and oriented, No gross deficits  Assessment and plan:  #Acute maxillary sinusitis Treating with Augmentin, prednisone also given to relieve pressure, however I do believe she may have underlying lung disease which this will also help with. Possible COPD with COPD exacerbation. Continue albuterol   Meds ordered this encounter  Medications  . amoxicillin-clavulanate (AUGMENTIN) 875-125 MG tablet    Sig: Take 1 tablet by mouth 2 (two) times daily.    Dispense:  20 tablet    Refill:  0  . predniSONE (DELTASONE) 20 MG tablet    Sig: Take 2 tablets (40 mg total) by mouth daily with breakfast.    Dispense:  10 tablet    Refill:  0    Laroy Apple, MD [facility] 03/19/2017, 11:21 AM

## 2017-04-11 ENCOUNTER — Other Ambulatory Visit: Payer: Self-pay | Admitting: Nurse Practitioner

## 2017-04-11 DIAGNOSIS — M5136 Other intervertebral disc degeneration, lumbar region: Secondary | ICD-10-CM | POA: Diagnosis not present

## 2017-04-11 DIAGNOSIS — F5101 Primary insomnia: Secondary | ICD-10-CM

## 2017-04-11 DIAGNOSIS — M5137 Other intervertebral disc degeneration, lumbosacral region: Secondary | ICD-10-CM | POA: Diagnosis not present

## 2017-04-11 DIAGNOSIS — M48062 Spinal stenosis, lumbar region with neurogenic claudication: Secondary | ICD-10-CM | POA: Diagnosis not present

## 2017-04-16 ENCOUNTER — Other Ambulatory Visit: Payer: Self-pay | Admitting: Nurse Practitioner

## 2017-05-01 DIAGNOSIS — M47816 Spondylosis without myelopathy or radiculopathy, lumbar region: Secondary | ICD-10-CM | POA: Diagnosis not present

## 2017-05-01 DIAGNOSIS — M1288 Other specific arthropathies, not elsewhere classified, other specified site: Secondary | ICD-10-CM | POA: Diagnosis not present

## 2017-05-01 DIAGNOSIS — M5136 Other intervertebral disc degeneration, lumbar region: Secondary | ICD-10-CM | POA: Diagnosis not present

## 2017-05-01 DIAGNOSIS — M62830 Muscle spasm of back: Secondary | ICD-10-CM | POA: Diagnosis not present

## 2017-05-01 DIAGNOSIS — M5386 Other specified dorsopathies, lumbar region: Secondary | ICD-10-CM | POA: Diagnosis not present

## 2017-05-11 ENCOUNTER — Other Ambulatory Visit: Payer: Self-pay | Admitting: Family Medicine

## 2017-06-05 ENCOUNTER — Other Ambulatory Visit: Payer: Self-pay | Admitting: Nurse Practitioner

## 2017-06-05 DIAGNOSIS — G2581 Restless legs syndrome: Secondary | ICD-10-CM

## 2017-06-07 ENCOUNTER — Other Ambulatory Visit: Payer: Self-pay | Admitting: Nurse Practitioner

## 2017-06-07 ENCOUNTER — Other Ambulatory Visit: Payer: Self-pay

## 2017-06-07 DIAGNOSIS — E782 Mixed hyperlipidemia: Secondary | ICD-10-CM

## 2017-06-07 MED ORDER — ATORVASTATIN CALCIUM 40 MG PO TABS: ORAL_TABLET | ORAL | 0 refills | Status: DC

## 2017-06-07 NOTE — Telephone Encounter (Signed)
Last refill without being seen 

## 2017-06-07 NOTE — Telephone Encounter (Signed)
Last seen 03/19/17  MMM Last lipid 12/18/16

## 2017-06-07 NOTE — Telephone Encounter (Signed)
Last lipid 12/18/16 

## 2017-06-17 ENCOUNTER — Other Ambulatory Visit: Payer: Self-pay | Admitting: Nurse Practitioner

## 2017-06-17 DIAGNOSIS — E782 Mixed hyperlipidemia: Secondary | ICD-10-CM

## 2017-06-20 ENCOUNTER — Other Ambulatory Visit: Payer: Self-pay | Admitting: Nurse Practitioner

## 2017-06-20 NOTE — Telephone Encounter (Signed)
Last seen 03/19/17  Dr Wendi Snipes  MMM PCP

## 2017-06-27 DIAGNOSIS — G894 Chronic pain syndrome: Secondary | ICD-10-CM | POA: Diagnosis not present

## 2017-06-27 DIAGNOSIS — Z79891 Long term (current) use of opiate analgesic: Secondary | ICD-10-CM | POA: Diagnosis not present

## 2017-07-02 ENCOUNTER — Other Ambulatory Visit: Payer: Self-pay | Admitting: Nurse Practitioner

## 2017-07-02 DIAGNOSIS — F5101 Primary insomnia: Secondary | ICD-10-CM

## 2017-07-08 ENCOUNTER — Other Ambulatory Visit: Payer: Self-pay | Admitting: Nurse Practitioner

## 2017-07-08 DIAGNOSIS — E782 Mixed hyperlipidemia: Secondary | ICD-10-CM

## 2017-07-10 ENCOUNTER — Other Ambulatory Visit: Payer: Self-pay | Admitting: Nurse Practitioner

## 2017-07-10 DIAGNOSIS — E782 Mixed hyperlipidemia: Secondary | ICD-10-CM

## 2017-08-01 ENCOUNTER — Other Ambulatory Visit: Payer: Self-pay | Admitting: Nurse Practitioner

## 2017-08-01 DIAGNOSIS — F3342 Major depressive disorder, recurrent, in full remission: Secondary | ICD-10-CM

## 2017-08-17 ENCOUNTER — Other Ambulatory Visit: Payer: Self-pay | Admitting: Nurse Practitioner

## 2017-08-17 DIAGNOSIS — K219 Gastro-esophageal reflux disease without esophagitis: Secondary | ICD-10-CM

## 2017-08-20 ENCOUNTER — Ambulatory Visit (INDEPENDENT_AMBULATORY_CARE_PROVIDER_SITE_OTHER): Payer: 59 | Admitting: Family Medicine

## 2017-08-20 ENCOUNTER — Encounter: Payer: Self-pay | Admitting: Family Medicine

## 2017-08-20 VITALS — BP 133/74 | HR 77 | Temp 97.0°F | Ht 62.0 in | Wt 215.0 lb

## 2017-08-20 DIAGNOSIS — J329 Chronic sinusitis, unspecified: Secondary | ICD-10-CM

## 2017-08-20 DIAGNOSIS — R600 Localized edema: Secondary | ICD-10-CM | POA: Diagnosis not present

## 2017-08-20 DIAGNOSIS — B9689 Other specified bacterial agents as the cause of diseases classified elsewhere: Secondary | ICD-10-CM | POA: Diagnosis not present

## 2017-08-20 MED ORDER — AMOXICILLIN-POT CLAVULANATE 875-125 MG PO TABS: 1.0000 | ORAL_TABLET | Freq: Two times a day (BID) | ORAL | 0 refills | Status: DC

## 2017-08-20 MED ORDER — BENZONATATE 100 MG PO CAPS
100.0000 mg | ORAL_CAPSULE | Freq: Two times a day (BID) | ORAL | 0 refills | Status: DC | PRN
Start: 2017-08-20 — End: 2018-01-30

## 2017-08-20 NOTE — Progress Notes (Signed)
Subjective: CC: Lower extremity edema, sinus infection  PCP: Chevis Pretty, FNP OVF:IEPPI Stacie Solis is a 57 y.o. female presenting to clinic today for:  1. lower extremity edema Patient reports a chronic history of bilateral lower extremity edema with right always greater than left.  She notes that over the last month or so the lower extremity edema seems to be worse, particularly when she is outside working.  She notes that the right lower extremity seems like it wants to start weeping but she has not seen any fluid from the lower extremity.  She reports using her Lasix 40 mg daily.  She is also been using compression hose provided by her son, who is a Marine scientist, intermittently.  She notes that she has been worked up extensively for the lower extremity edema in the past and they were unable to find any reason why she was having leg swelling.  She does report consumption of sun drops several times a day.  She notes that she watches her salt and does not add any salt to food.  2.  Sinusitis Patient reports that she has been having frontal and maxillary sinus pain for about 3 weeks now.  She reports associated congestion, productive cough, nasal drainage.  Denies any fevers, chills, known sick contacts.  She is an every day smoker.  She uses Nasacort and Mucinex but these are not helping the symptoms.    ROS: Per HPI  No Known Allergies Past Medical History:  Diagnosis Date  . Allergy   . Anxiety   . Chronic back pain   . GERD (gastroesophageal reflux disease)   . Hyperlipidemia     Current Outpatient Medications:  .  meloxicam (MOBIC) 7.5 MG tablet, Take 7.5 mg by mouth 2 (two) times daily., Disp: , Rfl:  .  albuterol (PROVENTIL HFA;VENTOLIN HFA) 108 (90 Base) MCG/ACT inhaler, USE 2 PUFFS EVERY 4 HOURS AS NEEDED, Disp: 8.5 Inhaler, Rfl: 3 .  amoxicillin-clavulanate (AUGMENTIN) 875-125 MG tablet, Take 1 tablet by mouth 2 (two) times daily., Disp: 20 tablet, Rfl: 0 .  atorvastatin  (LIPITOR) 40 MG tablet, TAKE 1 TABLET (40 MG TOTAL) BY MOUTH DAILY., Disp: 90 tablet, Rfl: 0 .  atorvastatin (LIPITOR) 40 MG tablet, TAKE 1 TABLET BY MOUTH EVERY DAY, Disp: 90 tablet, Rfl: 0 .  cholecalciferol (VITAMIN D) 1000 UNITS tablet, Take 1,000 Units by mouth daily., Disp: , Rfl:  .  Choline Fenofibrate 135 MG capsule, TAKE 1 CAPSULE BY MOUTH EVERY DAY, Disp: 90 capsule, Rfl: 0 .  cyclobenzaprine (FLEXERIL) 10 MG tablet, TAKE 1 TABLET BY MOUTH THREE TIMES A DAY AS NEEDED FOR MUSCLE SPASMS, Disp: 90 tablet, Rfl: 0 .  escitalopram (LEXAPRO) 10 MG tablet, TAKE 1 TABLET BY MOUTH EVERY DAY, Disp: 90 tablet, Rfl: 1 .  fish oil-omega-3 fatty acids 1000 MG capsule, Take 2 g by mouth 2 (two) times daily., Disp: , Rfl:  .  furosemide (LASIX) 40 MG tablet, Take 1 tablet (40 mg total) by mouth daily., Disp: 90 tablet, Rfl: 1 .  omeprazole (PRILOSEC) 40 MG capsule, TAKE 1 CAPSULE BY MOUTH EVERY DAY, Disp: 90 capsule, Rfl: 0 .  pramipexole (MIRAPEX) 0.5 MG tablet, TAKE 1 TABLET (0.5 MG TOTAL) BY MOUTH 3 (THREE) TIMES DAILY., Disp: 270 tablet, Rfl: 0 .  predniSONE (DELTASONE) 20 MG tablet, Take 2 tablets (40 mg total) by mouth daily with breakfast., Disp: 10 tablet, Rfl: 0 .  traZODone (DESYREL) 100 MG tablet, TAKE 1 TABLET (100 MG TOTAL) AT  BEDTIME BY MOUTH., Disp: 90 tablet, Rfl: 0 .  Triamcinolone Acetonide (NASACORT AQ NA), Place into the nose., Disp: , Rfl:  .  Wheat Dextrin (BENEFIBER) TABS, Take by mouth., Disp: , Rfl:  Social History   Socioeconomic History  . Marital status: Married    Spouse name: Not on file  . Number of children: 2  . Years of education: Not on file  . Highest education level: Not on file  Occupational History  . Occupation: N/A  Social Needs  . Financial resource strain: Not on file  . Food insecurity:    Worry: Not on file    Inability: Not on file  . Transportation needs:    Medical: Not on file    Non-medical: Not on file  Tobacco Use  . Smoking status:  Current Every Day Smoker    Packs/day: 2.00    Types: Cigarettes  . Smokeless tobacco: Never Used  . Tobacco comment: Declined info  Substance and Sexual Activity  . Alcohol use: No    Alcohol/week: 0.0 oz  . Drug use: No  . Sexual activity: Not on file  Lifestyle  . Physical activity:    Days per week: Not on file    Minutes per session: Not on file  . Stress: Not on file  Relationships  . Social connections:    Talks on phone: Not on file    Gets together: Not on file    Attends religious service: Not on file    Active member of club or organization: Not on file    Attends meetings of clubs or organizations: Not on file    Relationship status: Not on file  . Intimate partner violence:    Fear of current or ex partner: Not on file    Emotionally abused: Not on file    Physically abused: Not on file    Forced sexual activity: Not on file  Other Topics Concern  . Not on file  Social History Narrative  . Not on file   Family History  Problem Relation Age of Onset  . Hypertension Mother   . Diabetes Mother   . Arthritis Mother   . Lung cancer Maternal Grandfather     Objective: Office vital signs reviewed. BP 133/74   Pulse 77   Temp (!) 97 F (36.1 C) (Oral)   Ht 5\' 2"  (1.575 m)   Wt 215 lb (97.5 kg)   BMI 39.32 kg/m   Physical Examination:  General: Awake, alert, well nourished, nontoxic, No acute distress HEENT: +Frontal and maxillary sinus TTP    Neck: No masses palpated. No lymphadenopathy    Eyes: PERRLA, extraocular membranes intact, sclera white    Nose: nasal turbinates moist, clear nasal discharge    Throat: moist mucus membranes, no erythema, no tonsillar exudate.  Airway is patent Cardio: regular rate and rhythm, S1S2 heard, no murmurs appreciated Pulm: clear to auscultation bilaterally, no wheezes, rhonchi or rales; normal work of breathing on room air Extremities: warm, well perfused, 1+ pitting edema bilaterally to shins R>L. No cyanosis or  clubbing; +2 pulses bilaterally  Assessment/ Plan: 57 y.o. female   1. Bilateral edema of lower extremity Likely secondary to venous stasis.  No evidence of fluid overload otherwise.  Her cardiopulmonary exam was unremarkable.  I reviewed her chart and she does have a medical history of mild renal impairment with last creatinine of 1.02.  Albumin within normal limits so edema unlikely to be related to malnutrition.  No  recent CBC on file but symptoms have been ongoing since 2016.  If no improvement with compression hose, could consider repeating CBC to evaluate for anemia as a possible etiology of bilateral lower extremity edema.  I have prescribed her prescription compression hose.  I instructed her to watch the salt and reduce soda intake as this does contain sodium.  She will follow-up in 4 weeks with her PCP to recheck lower extremity edema.  2. Bacterial sinusitis Given duration of symptoms, will empirically treat for acute bacterial sinusitis.  Currently, patient is afebrile nontoxic-appearing.  Okay to continue Nasacort.  Augmentin 875 p.o. twice daily prescribed.  Tessalon Perles p.o. twice daily as needed cough prescribed.  Home care instructions reviewed; reasons for return discussed.   Meds ordered this encounter  Medications  . amoxicillin-clavulanate (AUGMENTIN) 875-125 MG tablet    Sig: Take 1 tablet by mouth 2 (two) times daily.    Dispense:  20 tablet    Refill:  0  . benzonatate (TESSALON) 100 MG capsule    Sig: Take 1 capsule (100 mg total) by mouth 2 (two) times daily as needed for cough.    Dispense:  20 capsule    Refill:  [facility], DO [place] [phone]

## 2017-08-20 NOTE — Patient Instructions (Signed)
Edema Edema is an abnormal buildup of fluids in your bodytissues. Edema is somewhatdependent on gravity to pull the fluid to the lowest place in your body. That makes the condition more common in the legs and thighs (lower extremities). Painless swelling of the feet and ankles is common and becomes more likely as you get older. It is also common in looser tissues, like around your eyes. When the affected area is squeezed, the fluid may move out of that spot and leave a dent for a few moments. This dent is called pitting. What are the causes? There are many possible causes of edema. Eating too much salt and being on your feet or sitting for a long time can cause edema in your legs and ankles. Hot weather may make edema worse. Common medical causes of edema include:  Heart failure.  Liver disease.  Kidney disease.  Weak blood vessels in your legs.  Cancer.  An injury.  Pregnancy.  Some medications.  Obesity.  What are the signs or symptoms? Edema is usually painless.Your skin may look swollen or shiny. How is this diagnosed? Your health care provider may be able to diagnose edema by asking about your medical history and doing a physical exam. You may need to have tests such as X-rays, an electrocardiogram, or blood tests to check for medical conditions that may cause edema. How is this treated? Edema treatment depends on the cause. If you have heart, liver, or kidney disease, you need the treatment appropriate for these conditions. General treatment may include:  Elevation of the affected body part above the level of your heart.  Compression of the affected body part. Pressure from elastic bandages or support stockings squeezes the tissues and forces fluid back into the blood vessels. This keeps fluid from entering the tissues.  Restriction of fluid and salt intake.  Use of a water pill (diuretic). These medications are appropriate only for some types of edema. They pull fluid  out of your body and make you urinate more often. This gets rid of fluid and reduces swelling, but diuretics can have side effects. Only use diuretics as directed by your health care provider.  Follow these instructions at home:  Keep the affected body part above the level of your heart when you are lying down.  Do not sit still or stand for prolonged periods.  Do not put anything directly under your knees when lying down.  Do not wear constricting clothing or garters on your upper legs.  Exercise your legs to work the fluid back into your blood vessels. This may help the swelling go down.  Wear elastic bandages or support stockings to reduce ankle swelling as directed by your health care provider.  Eat a low-salt diet to reduce fluid if your health care provider recommends it.  Only take medicines as directed by your health care provider. Contact a health care provider if:  Your edema is not responding to treatment.  You have heart, liver, or kidney disease and notice symptoms of edema.  You have edema in your legs that does not improve after elevating them.  You have sudden and unexplained weight gain. Get help right away if:  You develop shortness of breath or chest pain.  You cannot breathe when you lie down.  You develop pain, redness, or warmth in the swollen areas.  You have heart, liver, or kidney disease and suddenly get edema.  You have a fever and your symptoms suddenly get worse. This information is   not intended to replace advice given to you by your health care provider. Make sure you discuss any questions you have with your health care provider. Document Released: 02/12/2005 Document Revised: 07/21/2015 Document Reviewed: 12/05/2012 Elsevier Interactive Patient Education  2017 Elsevier Inc[employer]  

## 2017-09-26 DIAGNOSIS — M5136 Other intervertebral disc degeneration, lumbar region: Secondary | ICD-10-CM | POA: Diagnosis not present

## 2017-09-26 DIAGNOSIS — Z79891 Long term (current) use of opiate analgesic: Secondary | ICD-10-CM | POA: Diagnosis not present

## 2017-09-26 DIAGNOSIS — M545 Low back pain: Secondary | ICD-10-CM | POA: Diagnosis not present

## 2017-10-01 ENCOUNTER — Other Ambulatory Visit: Payer: Self-pay | Admitting: Nurse Practitioner

## 2017-10-01 DIAGNOSIS — F5101 Primary insomnia: Secondary | ICD-10-CM

## 2017-10-05 ENCOUNTER — Other Ambulatory Visit: Payer: Self-pay | Admitting: Nurse Practitioner

## 2017-10-05 DIAGNOSIS — E782 Mixed hyperlipidemia: Secondary | ICD-10-CM

## 2017-10-07 NOTE — Telephone Encounter (Signed)
Last lipid 12/18/16

## 2017-11-10 ENCOUNTER — Other Ambulatory Visit: Payer: Self-pay | Admitting: Nurse Practitioner

## 2017-11-11 NOTE — Telephone Encounter (Signed)
Last seen for edema 08/20/17 with Dr. Darnell Level, was to follow up in one month

## 2017-11-13 ENCOUNTER — Other Ambulatory Visit: Payer: Self-pay | Admitting: Nurse Practitioner

## 2017-11-13 DIAGNOSIS — K219 Gastro-esophageal reflux disease without esophagitis: Secondary | ICD-10-CM

## 2017-11-18 DIAGNOSIS — M5136 Other intervertebral disc degeneration, lumbar region: Secondary | ICD-10-CM | POA: Diagnosis not present

## 2017-12-11 ENCOUNTER — Other Ambulatory Visit: Payer: Self-pay | Admitting: Nurse Practitioner

## 2017-12-11 DIAGNOSIS — E782 Mixed hyperlipidemia: Secondary | ICD-10-CM

## 2017-12-22 ENCOUNTER — Other Ambulatory Visit: Payer: Self-pay | Admitting: Nurse Practitioner

## 2017-12-22 DIAGNOSIS — F5101 Primary insomnia: Secondary | ICD-10-CM

## 2017-12-23 ENCOUNTER — Encounter: Payer: Self-pay | Admitting: Gastroenterology

## 2017-12-23 NOTE — Telephone Encounter (Signed)
Last seen 08/20/17  Dr Darnell Level

## 2017-12-30 ENCOUNTER — Other Ambulatory Visit: Payer: Self-pay | Admitting: Nurse Practitioner

## 2017-12-30 DIAGNOSIS — E782 Mixed hyperlipidemia: Secondary | ICD-10-CM

## 2017-12-30 NOTE — Telephone Encounter (Signed)
MMM. NTBS. Last lipid 12/18/16

## 2017-12-31 NOTE — Telephone Encounter (Signed)
Apt made for tomorrow  

## 2018-01-01 ENCOUNTER — Ambulatory Visit (INDEPENDENT_AMBULATORY_CARE_PROVIDER_SITE_OTHER): Payer: 59 | Admitting: Nurse Practitioner

## 2018-01-01 ENCOUNTER — Encounter: Payer: Self-pay | Admitting: Nurse Practitioner

## 2018-01-01 VITALS — BP 115/66 | HR 80 | Temp 97.5°F | Ht 62.0 in | Wt 204.0 lb

## 2018-01-01 DIAGNOSIS — G500 Trigeminal neuralgia: Secondary | ICD-10-CM | POA: Diagnosis not present

## 2018-01-01 DIAGNOSIS — E782 Mixed hyperlipidemia: Secondary | ICD-10-CM | POA: Diagnosis not present

## 2018-01-01 DIAGNOSIS — F17200 Nicotine dependence, unspecified, uncomplicated: Secondary | ICD-10-CM

## 2018-01-01 DIAGNOSIS — R609 Edema, unspecified: Secondary | ICD-10-CM

## 2018-01-01 DIAGNOSIS — F410 Panic disorder [episodic paroxysmal anxiety] without agoraphobia: Secondary | ICD-10-CM

## 2018-01-01 DIAGNOSIS — L989 Disorder of the skin and subcutaneous tissue, unspecified: Secondary | ICD-10-CM

## 2018-01-01 DIAGNOSIS — K219 Gastro-esophageal reflux disease without esophagitis: Secondary | ICD-10-CM

## 2018-01-01 DIAGNOSIS — F5101 Primary insomnia: Secondary | ICD-10-CM

## 2018-01-01 DIAGNOSIS — G2581 Restless legs syndrome: Secondary | ICD-10-CM

## 2018-01-01 DIAGNOSIS — G8929 Other chronic pain: Secondary | ICD-10-CM

## 2018-01-01 DIAGNOSIS — Z6835 Body mass index (BMI) 35.0-35.9, adult: Secondary | ICD-10-CM

## 2018-01-01 DIAGNOSIS — M545 Low back pain, unspecified: Secondary | ICD-10-CM

## 2018-01-01 DIAGNOSIS — F3342 Major depressive disorder, recurrent, in full remission: Secondary | ICD-10-CM

## 2018-01-01 MED ORDER — ESCITALOPRAM OXALATE 10 MG PO TABS: ORAL_TABLET | ORAL | 1 refills | Status: DC

## 2018-01-01 MED ORDER — ATORVASTATIN CALCIUM 40 MG PO TABS: ORAL_TABLET | ORAL | 1 refills | Status: DC

## 2018-01-01 MED ORDER — TRAZODONE HCL 100 MG PO TABS: 100.0000 mg | ORAL_TABLET | Freq: Every day | ORAL | 1 refills | Status: DC

## 2018-01-01 MED ORDER — OMEPRAZOLE 40 MG PO CPDR: 40.0000 mg | DELAYED_RELEASE_CAPSULE | Freq: Every day | ORAL | 1 refills | Status: DC

## 2018-01-01 MED ORDER — FUROSEMIDE 40 MG PO TABS: 40.0000 mg | ORAL_TABLET | Freq: Every day | ORAL | 1 refills | Status: DC

## 2018-01-01 MED ORDER — FENOFIBRIC ACID 135 MG PO CPDR: 1.0000 | DELAYED_RELEASE_CAPSULE | Freq: Every day | ORAL | 1 refills | Status: DC

## 2018-01-01 MED ORDER — CYCLOBENZAPRINE HCL 10 MG PO TABS: ORAL_TABLET | ORAL | 1 refills | Status: DC

## 2018-01-01 MED ORDER — PRAMIPEXOLE DIHYDROCHLORIDE 0.5 MG PO TABS: 0.5000 mg | ORAL_TABLET | Freq: Three times a day (TID) | ORAL | 1 refills | Status: DC

## 2018-01-01 NOTE — Progress Notes (Signed)
Subjective:    Patient ID: Stacie Solis, female    DOB: 11-04-1960, 57 y.o.   MRN: [id number]   Chief Complaint: Medical management of chronic issues  HPI:  1. Gastroesophageal reflux disease without esophagitis  Is on omeprazole daily and works well to keep symptoms under control.  2. Trigeminal neuralgia  Has not really been bothering her that much lately.  3. Mixed hyperlipidemia  Does not watch diet and does no exercise  4. Panic attacks  Is on a daily dose of lexapro which helps some with her constant worrying..   5. Recurrent major depressive disorder, in full remission (Stacie Solis)  Again she in on lexapro which helps her a lot Depression screen [facility] 2/9 08/20/2017 03/19/2017 12/18/2016  Decreased Interest 0 2 0  Down, Depressed, Hopeless 0 2 0  PHQ - 2 Score 0 4 0  Altered sleeping - 2 -  Tired, decreased energy - 2 -  Change in appetite - 0 -  Feeling bad or failure about yourself  - 0 -  Trouble concentrating - 1 -  Moving slowly or fidgety/restless - 1 -  Suicidal thoughts - 0 -  PHQ-9 Score - 10 -  Difficult doing work/chores - - -     6. Smoker  Smokes over a pack a day  7. BMI 35.0-35.9,adult  No recent weight changes  8. RLS (restless legs syndrome)  Takes mirapex nightly which helps her legs to not wake her moving at night  9. Primary insomnia  Is on trazadone at night which helps her sleep most nights  10. Chronic midline low back pain without sciatica  She takes mobic and flexril when she needs it for her back. Ratee pain 6/10 currently. Rest ing helps with pain and lots of activity increases pain.    Outpatient Encounter Medications as of 01/01/2018  Medication Sig  . albuterol (PROVENTIL HFA;VENTOLIN HFA) 108 (90 Base) MCG/ACT inhaler USE 2 PUFFS EVERY 4 HOURS AS NEEDED  . atorvastatin (LIPITOR) 40 MG tablet TAKE 1 TABLET BY MOUTH EVERY DAY (Needs to be seen before next refill)  . benzonatate (TESSALON) 100 MG capsule Take 1 capsule (100 mg total) by mouth  2 (two) times daily as needed for cough.  . cholecalciferol (VITAMIN D) 1000 UNITS tablet Take 1,000 Units by mouth daily.  . Choline Fenofibrate (FENOFIBRIC ACID) 135 MG CPDR TAKE 1 CAPSULE BY MOUTH EVERY DAY  . cyclobenzaprine (FLEXERIL) 10 MG tablet TAKE 1 TABLET BY MOUTH THREE TIMES A DAY AS NEEDED FOR MUSCLE SPASMS  . escitalopram (LEXAPRO) 10 MG tablet TAKE 1 TABLET BY MOUTH EVERY DAY  . fish oil-omega-3 fatty acids 1000 MG capsule Take 2 g by mouth 2 (two) times daily.  . furosemide (LASIX) 40 MG tablet Take 1 tablet (40 mg total) by mouth daily.  . meloxicam (MOBIC) 7.5 MG tablet Take 7.5 mg by mouth 2 (two) times daily.  Marland Kitchen omeprazole (PRILOSEC) 40 MG capsule TAKE 1 CAPSULE BY MOUTH EVERY DAY  . pramipexole (MIRAPEX) 0.5 MG tablet TAKE 1 TABLET (0.5 MG TOTAL) BY MOUTH 3 (THREE) TIMES DAILY.  Marland Kitchen predniSONE (DELTASONE) 20 MG tablet Take 2 tablets (40 mg total) by mouth daily with breakfast.  . traZODone (DESYREL) 100 MG tablet TAKE 1 TABLET (100 MG TOTAL) AT BEDTIME BY MOUTH.  . Triamcinolone Acetonide (NASACORT AQ NA) Place into the nose.  . Wheat Dextrin (BENEFIBER) TABS Take by mouth.      New complaints: - sores in her scalp-  have been there all summer  Social history: Lives with husband- does not work.    Review of Systems  Constitutional: Negative for activity change and appetite change.  HENT: Negative.   Eyes: Negative for pain.  Respiratory: Negative for shortness of breath.   Cardiovascular: Negative for chest pain, palpitations and leg swelling.  Gastrointestinal: Negative for abdominal pain.  Endocrine: Negative for polydipsia.  Genitourinary: Negative.   Musculoskeletal: Positive for back pain.  Skin: Negative for rash.       Scalp lesions  Neurological: Negative for dizziness, weakness and headaches.  Hematological: Does not bruise/bleed easily.  Psychiatric/Behavioral: Negative.   All other systems reviewed and are negative.      Objective:   Physical  Exam  Constitutional: She is oriented to person, place, and time. She appears well-developed and well-nourished. No distress.  HENT:  Head: Normocephalic.  Nose: Nose normal.  Mouth/Throat: Oropharynx is clear and moist.  Eyes: Pupils are equal, round, and reactive to light. EOM are normal.  Neck: Normal range of motion. Neck supple. No JVD present. Carotid bruit is not present.  Cardiovascular: Normal rate, regular rhythm, normal heart sounds and intact distal pulses.  Pulmonary/Chest: Effort normal and breath sounds normal. No respiratory distress. She has no wheezes. She has no rales. She exhibits no tenderness.  Abdominal: Soft. Normal appearance, normal aorta and bowel sounds are normal. She exhibits no distension, no abdominal bruit, no pulsatile midline mass and no mass. There is no splenomegaly or hepatomegaly. There is no tenderness.  Musculoskeletal: Normal range of motion. She exhibits no edema.  Lymphadenopathy:    She has no cervical adenopathy.  Neurological: She is alert and oriented to person, place, and time. She has normal reflexes.  Skin: Skin is warm and dry.  Dry scabbed over lesion on scalp at top of head.  Psychiatric: She has a normal mood and affect. Her behavior is normal. Judgment and thought content normal.  Nursing note and vitals reviewed.   BP 115/66   Pulse 80   Temp (!) 97.5 F (36.4 C) (Oral)   Ht '5\' 2"'  (1.575 m)   Wt 204 lb (92.5 kg)   BMI 37.31 kg/m        Assessment & Plan:  Stacie Solis comes in today with chief complaint of Medical Management of Chronic Issues ([place] in [place])   Diagnosis and orders addressed:  1. Gastroesophageal reflux disease without esophagitis Avoid spicy foods Do not eat 2 hours prior to bedtime - omeprazole (PRILOSEC) 40 MG capsule; Take 1 capsule (40 mg total) by mouth daily.  Dispense: 90 capsule; Refill: 1  2. Trigeminal neuralgia  3. Mixed hyperlipidemia Low ft aidet - atorvastatin (LIPITOR) 40 MG  tablet; TAKE 1 TABLET BY MOUTH EVERY DAY (Needs to be seen before next refill)  Dispense: 90 tablet; Refill: 1 - Choline Fenofibrate (FENOFIBRIC ACID) 135 MG CPDR; Take 1 tablet by mouth daily.  Dispense: 90 capsule; Refill: 1 - CMP14+EGFR - Lipid panel  4. Panic attacks Stress management  5. Recurrent major depressive disorder, in full remission ([place]) Stress management - escitalopram (LEXAPRO) 10 MG tablet; TAKE 1 TABLET BY MOUTH EVERY DAY  Dispense: 90 tablet; Refill: 1  6. Smoker Smoking cessation encouraged  7. BMI 35.0-35.9 Discussed diet and exercise for person with BMI >25 Will recheck weight in 3-6 months  8. RLS (restless legs syndrome) Keep legs warm at night - pramipexole (MIRAPEX) 0.5 MG tablet; Take 1 tablet (0.5 mg total) by mouth 3 (  three) times daily.  Dispense: 270 tablet; Refill: 1  9. Primary insomnia Bedtime routine - traZODone (DESYREL) 100 MG tablet; Take 1 tablet (100 mg total) by mouth at bedtime.  Dispense: 90 tablet; Refill: 1  10. Chronic midline low back pain without sciatica Keep follow up with back specialist - cyclobenzaprine (FLEXERIL) 10 MG tablet; TAKE 1 TABLET BY MOUTH THREE TIMES A DAY AS NEEDED FOR MUSCLE SPASMS  Dispense: 90 tablet; Refill: 1  11. Scalp lesion Do not pick or scratch - Ambulatory referral to Dermatology  12. Peripheral edema Elevate legs when sitting - furosemide (LASIX) 40 MG tablet; Take 1 tablet (40 mg total) by mouth daily.  Dispense: 90 tablet; Refill: 1 - furosemide (LASIX) 40 MG tablet; Take 1 tablet (40 mg total) by mouth daily.  Dispense: 90 tablet; Refill: 1   Labs pending Health Maintenance reviewed Diet and exercise encouraged  Follow up plan: 6 months   [name], FNP

## 2018-01-01 NOTE — Patient Instructions (Signed)

## 2018-01-02 LAB — LIPID PANEL
CHOLESTEROL TOTAL: 110 mg/dL (ref 100–199)
Chol/HDL Ratio: 3.5 ratio (ref 0.0–4.4)
HDL: 31 mg/dL — AB (ref >39)
LDL CALC: 53 mg/dL (ref 0–99)
Triglycerides: 132 mg/dL (ref 0–149)
VLDL CHOLESTEROL CAL: 26 mg/dL (ref 5–40)

## 2018-01-02 LAB — CMP14+EGFR
ALK PHOS: 68 IU/L (ref 39–117)
ALT: 26 IU/L (ref 0–32)
AST: 27 IU/L (ref 0–40)
Albumin/Globulin Ratio: 1.8 (ref 1.2–2.2)
Albumin: 4.6 g/dL (ref 3.5–5.5)
BUN/Creatinine Ratio: 14 (ref 9–23)
BUN: 18 mg/dL (ref 6–24)
Bilirubin Total: 0.6 mg/dL (ref 0.0–1.2)
CO2: 20 mmol/L (ref 20–29)
CREATININE: 1.31 mg/dL — AB (ref 0.57–1.00)
Calcium: 9.6 mg/dL (ref 8.7–10.2)
Chloride: 101 mmol/L (ref 96–106)
GFR calc Af Amer: 51 mL/min/{1.73_m2} — ABNORMAL LOW (ref >59)
GFR calc non Af Amer: 45 mL/min/{1.73_m2} — ABNORMAL LOW (ref >59)
GLUCOSE: 92 mg/dL (ref 65–99)
Globulin, Total: 2.5 g/dL (ref 1.5–4.5)
Potassium: 4 mmol/L (ref 3.5–5.2)
Sodium: 142 mmol/L (ref 134–144)
TOTAL PROTEIN: 7.1 g/dL (ref 6.0–8.5)

## 2018-01-07 DIAGNOSIS — L981 Factitial dermatitis: Secondary | ICD-10-CM | POA: Diagnosis not present

## 2018-01-20 DIAGNOSIS — Z79899 Other long term (current) drug therapy: Secondary | ICD-10-CM | POA: Diagnosis not present

## 2018-01-20 DIAGNOSIS — Z5181 Encounter for therapeutic drug level monitoring: Secondary | ICD-10-CM | POA: Diagnosis not present

## 2018-01-30 ENCOUNTER — Encounter: Payer: Self-pay | Admitting: Family Medicine

## 2018-01-30 ENCOUNTER — Telehealth: Payer: Self-pay | Admitting: Nurse Practitioner

## 2018-01-30 ENCOUNTER — Ambulatory Visit (INDEPENDENT_AMBULATORY_CARE_PROVIDER_SITE_OTHER): Payer: 59 | Admitting: Family Medicine

## 2018-01-30 VITALS — BP 124/67 | HR 88 | Temp 98.2°F | Ht 62.0 in | Wt 203.0 lb

## 2018-01-30 DIAGNOSIS — R0989 Other specified symptoms and signs involving the circulatory and respiratory systems: Secondary | ICD-10-CM | POA: Diagnosis not present

## 2018-01-30 DIAGNOSIS — J441 Chronic obstructive pulmonary disease with (acute) exacerbation: Secondary | ICD-10-CM | POA: Diagnosis not present

## 2018-01-30 LAB — VERITOR FLU A/B WAIVED
INFLUENZA A: NEGATIVE
INFLUENZA B: NEGATIVE

## 2018-01-30 MED ORDER — GUAIFENESIN-DM 100-10 MG/5ML PO SYRP: 5.0000 mL | ORAL_SOLUTION | ORAL | 0 refills | Status: DC | PRN

## 2018-01-30 MED ORDER — AZITHROMYCIN 250 MG PO TABS: ORAL_TABLET | ORAL | 0 refills | Status: DC

## 2018-01-30 MED ORDER — PREDNISONE 20 MG PO TABS: ORAL_TABLET | ORAL | 0 refills | Status: DC

## 2018-01-30 NOTE — Progress Notes (Signed)
BP 124/67   Pulse 88   Temp 98.2 F (36.8 C) (Oral)   Ht 5\' 2"  (1.575 m)   Wt 203 lb (92.1 kg)   BMI 37.13 kg/m    Subjective:    Patient ID: Stacie Solis, female    DOB: Mar 16, 1960, 58 y.o.   MRN: [id number]  HPI: Stacie Solis is a 57 y.o. female presenting on 01/30/2018 for Cough and Nasal Congestion   HPI Cough and nasal congestion and body aches and sweats Patient comes in today complaining of cough nasal congestion body aches and sweats is been going on for the past 2 days and she feels like is worsening.  She has also had a cough that has been productive and keeping her up at night.  She says she has been wheezing.  Patient does admit that she is a smoker and has been smoking for many years at 2 packs/day.  She has no desire to quit.  She denies any shortness of breath.  She denies any sick contacts that she knows of.  She has been using Allegra and Nasacort and Sudafed and her albuterol inhaler which have been helping some but she still feels like she is getting worse.  Relevant past medical, surgical, family and social history reviewed and updated as indicated. Interim medical history since our last visit reviewed. Allergies and medications reviewed and updated.  Review of Systems  Constitutional: Positive for chills and fever.  HENT: Positive for congestion, postnasal drip, rhinorrhea, sinus pressure, sneezing and sore throat. Negative for ear discharge and ear pain.   Eyes: Negative for pain, redness and visual disturbance.  Respiratory: Positive for cough and wheezing. Negative for chest tightness and shortness of breath.   Cardiovascular: Negative for chest pain and leg swelling.  Musculoskeletal: Negative for back pain and gait problem.  Skin: Negative for color change and rash.  Neurological: Positive for headaches. Negative for light-headedness.  Psychiatric/Behavioral: Negative for agitation and behavioral problems.  All other systems reviewed and are  negative.   Per HPI unless specifically indicated above        Objective:    BP 124/67   Pulse 88   Temp 98.2 F (36.8 C) (Oral)   Ht 5\' 2"  (1.575 m)   Wt 203 lb (92.1 kg)   BMI 37.13 kg/m   Wt Readings from Last 3 Encounters:  01/30/18 203 lb (92.1 kg)  01/01/18 204 lb (92.5 kg)  08/20/17 215 lb (97.5 kg)    Physical Exam  Constitutional: She is oriented to person, place, and time. She appears well-developed and well-nourished. No distress.  HENT:  Right Ear: Tympanic membrane, external ear and ear canal normal.  Left Ear: Tympanic membrane, external ear and ear canal normal.  Nose: Mucosal edema and rhinorrhea present. No epistaxis. Right sinus exhibits no maxillary sinus tenderness and no frontal sinus tenderness. Left sinus exhibits no maxillary sinus tenderness and no frontal sinus tenderness.  Mouth/Throat: Uvula is midline and mucous membranes are normal. Posterior oropharyngeal edema and posterior oropharyngeal erythema present. No oropharyngeal exudate or tonsillar abscesses.  Eyes: Conjunctivae are normal.  Cardiovascular: Normal rate, regular rhythm, normal heart sounds and intact distal pulses.  No murmur heard. Pulmonary/Chest: Effort normal. No respiratory distress. She has wheezes in the right upper field and the left upper field. She has rhonchi.  Musculoskeletal: Normal range of motion. She exhibits no edema or tenderness.  Neurological: She is alert and oriented to person, place, and time. Coordination normal.  Skin: Skin is warm and dry. No rash noted. She is not diaphoretic.  Psychiatric: She has a normal mood and affect. Her behavior is normal.  Nursing note and vitals reviewed.   Flu test: Negative    Assessment & Plan:   Problem List Items Addressed This Visit    None    Visit Diagnoses    COPD exacerbation ([place])    -  Primary   Relevant Medications   predniSONE (DELTASONE) 20 MG tablet   azithromycin (ZITHROMAX) 250 MG tablet   Other  Relevant Orders   Veritor Flu A/B Waived       Follow up plan: Return if symptoms worsen or fail to improve.  Counseling provided for all of the vaccine components Orders Placed This Encounter  Procedures  . Veritor Flu A/B Linton Hall, MD [facility] 01/30/2018, 5:21 PM

## 2018-01-30 NOTE — Telephone Encounter (Signed)
Pt is going to get husband to bring her, she is scheduled to come in today to see dr dettinger

## 2018-01-30 NOTE — Telephone Encounter (Signed)
PT states that she is having sinus pressure in her head, coughing up yellow thick mucus, body aches, dizzy, running low grade fever, would like to have something send into [facility], doesn't think she should drive since she is dizzy.

## 2018-01-30 NOTE — Telephone Encounter (Signed)
Could she see if she can get someone to drive her to an appointment, as I feel she should be evaluated

## 2018-01-30 NOTE — Addendum Note (Signed)
Addended by: Rolena Infante on: 01/30/2018 05:27 PM   Modules accepted: Orders

## 2018-02-03 ENCOUNTER — Telehealth: Payer: Self-pay | Admitting: Family Medicine

## 2018-02-03 MED ORDER — BENZONATATE 200 MG PO CAPS: 200.0000 mg | ORAL_CAPSULE | Freq: Three times a day (TID) | ORAL | 1 refills | Status: DC | PRN

## 2018-02-03 NOTE — Telephone Encounter (Signed)
Tessalon Prescription sent to pharmacy   

## 2018-02-03 NOTE — Telephone Encounter (Signed)
Pt aware Tessalon Pearles sent in but she states she needs a stronger antibiotic and that dr Dettinger had told her he would send a stronger one in if the Walker didn't work. Pt advised Dr Warrick Parisian out of office till tomorrow and I would send message about stronger antibiotic.

## 2018-02-03 NOTE — Telephone Encounter (Signed)
What symptoms do you have? Was seen 12-5 with Dettinger for sinus infection and bronchitis. Was told if she didn't get any better to call back. She is the same. She would like to get the tessalon pearls for her cough.  How long have you been sick? 12-5  Have you been seen for this problem? 12-5  If your provider decides to give you a prescription, which pharmacy would you like for it to be sent to? [facility] in [place]   Patient informed that this information will be sent to the clinical staff for review and that they should receive a follow up call.

## 2018-02-04 MED ORDER — AMOXICILLIN-POT CLAVULANATE 875-125 MG PO TABS: 1.0000 | ORAL_TABLET | Freq: Two times a day (BID) | ORAL | 0 refills | Status: DC

## 2018-02-04 NOTE — Telephone Encounter (Signed)
Pt aware and voiced understanding 

## 2018-02-04 NOTE — Addendum Note (Signed)
Addended by: Caryl Pina on: 02/04/2018 09:17 AM   Modules accepted: Orders

## 2018-02-04 NOTE — Telephone Encounter (Signed)
I sent in Augmentin for the patient, if she still not better with this and she needs to come in to be seen again.

## 2018-03-08 ENCOUNTER — Other Ambulatory Visit: Payer: Self-pay | Admitting: Nurse Practitioner

## 2018-03-08 DIAGNOSIS — M545 Low back pain: Principal | ICD-10-CM

## 2018-03-08 DIAGNOSIS — G8929 Other chronic pain: Secondary | ICD-10-CM

## 2018-03-26 DIAGNOSIS — Z1231 Encounter for screening mammogram for malignant neoplasm of breast: Secondary | ICD-10-CM | POA: Diagnosis not present

## 2018-03-26 LAB — HM MAMMOGRAPHY

## 2018-04-13 ENCOUNTER — Other Ambulatory Visit: Payer: Self-pay | Admitting: Nurse Practitioner

## 2018-06-29 ENCOUNTER — Other Ambulatory Visit: Payer: Self-pay | Admitting: Nurse Practitioner

## 2018-06-29 DIAGNOSIS — G2581 Restless legs syndrome: Secondary | ICD-10-CM

## 2018-06-29 DIAGNOSIS — E782 Mixed hyperlipidemia: Secondary | ICD-10-CM

## 2018-06-30 ENCOUNTER — Other Ambulatory Visit: Payer: Self-pay

## 2018-07-01 ENCOUNTER — Encounter: Payer: Self-pay | Admitting: Nurse Practitioner

## 2018-07-01 ENCOUNTER — Ambulatory Visit (INDEPENDENT_AMBULATORY_CARE_PROVIDER_SITE_OTHER): Payer: 59 | Admitting: Nurse Practitioner

## 2018-07-01 DIAGNOSIS — G500 Trigeminal neuralgia: Secondary | ICD-10-CM

## 2018-07-01 DIAGNOSIS — F17200 Nicotine dependence, unspecified, uncomplicated: Secondary | ICD-10-CM

## 2018-07-01 DIAGNOSIS — K219 Gastro-esophageal reflux disease without esophagitis: Secondary | ICD-10-CM | POA: Diagnosis not present

## 2018-07-01 DIAGNOSIS — R609 Edema, unspecified: Secondary | ICD-10-CM

## 2018-07-01 DIAGNOSIS — Z6835 Body mass index (BMI) 35.0-35.9, adult: Secondary | ICD-10-CM

## 2018-07-01 DIAGNOSIS — G2581 Restless legs syndrome: Secondary | ICD-10-CM | POA: Diagnosis not present

## 2018-07-01 DIAGNOSIS — F410 Panic disorder [episodic paroxysmal anxiety] without agoraphobia: Secondary | ICD-10-CM

## 2018-07-01 DIAGNOSIS — F3342 Major depressive disorder, recurrent, in full remission: Secondary | ICD-10-CM

## 2018-07-01 DIAGNOSIS — R600 Localized edema: Secondary | ICD-10-CM

## 2018-07-01 DIAGNOSIS — E782 Mixed hyperlipidemia: Secondary | ICD-10-CM

## 2018-07-01 DIAGNOSIS — F5101 Primary insomnia: Secondary | ICD-10-CM

## 2018-07-01 MED ORDER — PRAMIPEXOLE DIHYDROCHLORIDE 0.5 MG PO TABS: 0.5000 mg | ORAL_TABLET | Freq: Three times a day (TID) | ORAL | 1 refills | Status: DC

## 2018-07-01 MED ORDER — FUROSEMIDE 40 MG PO TABS: 40.0000 mg | ORAL_TABLET | Freq: Every day | ORAL | 1 refills | Status: DC

## 2018-07-01 MED ORDER — OMEPRAZOLE 40 MG PO CPDR: 40.0000 mg | DELAYED_RELEASE_CAPSULE | Freq: Every day | ORAL | 1 refills | Status: DC

## 2018-07-01 MED ORDER — FENOFIBRIC ACID 135 MG PO CPDR: 1.0000 | DELAYED_RELEASE_CAPSULE | Freq: Every day | ORAL | 1 refills | Status: DC

## 2018-07-01 MED ORDER — ATORVASTATIN CALCIUM 40 MG PO TABS: ORAL_TABLET | ORAL | 1 refills | Status: DC

## 2018-07-01 MED ORDER — ZOLPIDEM TARTRATE 10 MG PO TABS: 10.0000 mg | ORAL_TABLET | Freq: Every evening | ORAL | 1 refills | Status: DC | PRN

## 2018-07-01 MED ORDER — ZOLPIDEM TARTRATE 10 MG PO TABS: 10.0000 mg | ORAL_TABLET | Freq: Every evening | ORAL | 2 refills | Status: DC | PRN

## 2018-07-01 MED ORDER — ESCITALOPRAM OXALATE 20 MG PO TABS: 20.0000 mg | ORAL_TABLET | Freq: Every day | ORAL | 1 refills | Status: DC

## 2018-07-01 NOTE — Progress Notes (Signed)
Patient ID: Stacie Solis, female   DOB: Jul 02, 1960, 58 y.o.   MRN: [id number]    Virtual Visit via telephone Note  I connected with Stacie Solis on 07/01/18 at 9:50 AM by telephone and verified that I am speaking with the correct person using two identifiers. Stacie Solis is currently located at home and no one is currently with her during visit. The provider, [name], FNP is located in their office at time of visit.  I discussed the limitations, risks, security and privacy concerns of performing an evaluation and management service by telephone and the availability of in person appointments. I also discussed with the patient that there may be a patient responsible charge related to this service. The patient expressed understanding and agreed to proceed.   History and Present Illness:   Chief Complaint: medical management of chronic issues   HPI:  1. Mixed hyperlipidemia Does not watch diet and does no exercise  2. Trigeminal neuralgia Has not had any recent flare ups.  3. Gastroesophageal reflux disease without esophagitis Is on omeprazole daily and works well to keep symptoms under control  4. RLS (restless legs syndrome) Takes mirapex nighty and works well to keep legs from moving all night long  5. Primary insomnia She takes trazadone at noiht and she still is not sleeping well.  6. Panic attacks lexapro is working well and she has increased her dose to 20mg  and that seem sto work better.  7. Recurrent major depressive disorder, in full remission ([place]) Again is on lexapro and she increased dose to 20mg  daily. She needs a new rx.  8. Smoker Smokes over a pack a day  9. BMI 35.0-35.9,adult No recent weight changes    Outpatient Encounter Medications as of 07/01/2018  Medication Sig  . albuterol (PROVENTIL HFA;VENTOLIN HFA) 108 (90 Base) MCG/ACT inhaler USE 2 PUFFS EVERY 4 HOURS AS NEEDED  . amoxicillin-clavulanate (AUGMENTIN) 875-125 MG tablet Take 1  tablet by mouth 2 (two) times daily.  Marland Kitchen atorvastatin (LIPITOR) 40 MG tablet TAKE 1 TABLET BY MOUTH EVERY DAY (Needs to be seen before next refill)  . azithromycin (ZITHROMAX) 250 MG tablet Take 2 the first day and then one each day after.  . benzonatate (TESSALON) 200 MG capsule Take 1 capsule (200 mg total) by mouth 3 (three) times daily as needed.  . cholecalciferol (VITAMIN D) 1000 UNITS tablet Take 1,000 Units by mouth daily.  . Choline Fenofibrate (FENOFIBRIC ACID) 135 MG CPDR TAKE 1 CAPSULE BY MOUTH EVERY DAY  . cyclobenzaprine (FLEXERIL) 10 MG tablet TAKE 1 TABLET BY MOUTH THREE TIMES A DAY AS NEEDED FOR MUSCLE SPASMS  . escitalopram (LEXAPRO) 10 MG tablet TAKE 1 TABLET BY MOUTH EVERY DAY  . fish oil-omega-3 fatty acids 1000 MG capsule Take 2 g by mouth 2 (two) times daily.  . furosemide (LASIX) 40 MG tablet Take 1 tablet (40 mg total) by mouth daily.  Marland Kitchen guaiFENesin-dextromethorphan (ROBITUSSIN DM) 100-10 MG/5ML syrup Take 5 mLs by mouth every 4 (four) hours as needed for cough.  Marland Kitchen omeprazole (PRILOSEC) 40 MG capsule Take 1 capsule (40 mg total) by mouth daily.  . pramipexole (MIRAPEX) 0.5 MG tablet TAKE 1 TABLET (0.5 MG TOTAL) BY MOUTH 3 (THREE) TIMES DAILY.  Marland Kitchen predniSONE (DELTASONE) 20 MG tablet 2 po at same time daily for 5 days  . traZODone (DESYREL) 100 MG tablet Take 1 tablet (100 mg total) by mouth at bedtime.  . Triamcinolone Acetonide (NASACORT AQ NA) Place into  the nose.       New complaints: None today  Social history: Lives with her husband     Review of Systems  Constitutional: Negative for diaphoresis and weight loss.  Eyes: Negative for blurred vision, double vision and pain.  Respiratory: Negative for shortness of breath.   Cardiovascular: Negative for chest pain, palpitations, orthopnea and leg swelling.  Gastrointestinal: Negative for abdominal pain.  Skin: Negative for rash.  Neurological: Negative for dizziness, sensory change, loss of consciousness,  weakness and headaches.  Endo/Heme/Allergies: Negative for polydipsia. Does not bruise/bleed easily.  Psychiatric/Behavioral: Negative for memory loss. The patient does not have insomnia.   All other systems reviewed and are negative.    Observations/Objective: Alert and oriented- answers all questions appropriately No distress noted  Assessment and Plan: Stacie Solis comes in today with chief complaint of No chief complaint on file.   Diagnosis and orders addressed:  1. Mixed hyperlipidemia Continue to watch fats in diet - Choline Fenofibrate (FENOFIBRIC ACID) 135 MG CPDR; Take 1 tablet by mouth daily.  Dispense: 90 capsule; Refill: 1 - atorvastatin (LIPITOR) 40 MG tablet; TAKE 1 TABLET BY MOUTH EVERY DAY (Needs to be seen before next refill)  Dispense: 90 tablet; Refill: 1  2. Trigeminal neuralgia Avoid chewing gum or hard to chew foods  3. Gastroesophageal reflux disease without esophagitis Avoid spicy foods Do not eat 2 hours prior to bedtime - omeprazole (PRILOSEC) 40 MG capsule; Take 1 capsule (40 mg total) by mouth daily.  Dispense: 90 capsule; Refill: 1  4. RLS (restless legs syndrome) Keep legs warm at night - pramipexole (MIRAPEX) 0.5 MG tablet; Take 1 tablet (0.5 mg total) by mouth 3 (three) times daily.  Dispense: 270 tablet; Refill: 1  5. Primary insomnia Stop trazadone  Changed to ambien nightly  zolpidem (AMBIEN) 10 MG tablet; Take 1 tablet (10 mg total) by mouth at bedtime as needed for up to 30 days for sleep.  Dispense: 30 tablet; Refill: 3   6. Panic attacks/ depression Increased lexapro tp 20mg  daily Stress management  7. Smoker Smokes about  A pack a day  9. BMI 35.0-35.9,adult Discussed diet and exercise for person with BMI >25 Will recheck weight in 3-6 months  10. Peripheral edema Elevate legs when sitting Compression socks daily - furosemide (LASIX) 40 MG tablet; Take 1 tablet (40 mg total) by mouth daily.  Dispense: 90 tablet; Refill: 1    Previous lab results reviewed Health Maintenance reviewed Diet and exercise encouraged  Follow up plan: 3 month      I discussed the assessment and treatment plan with the patient. The patient was provided an opportunity to ask questions and all were answered. The patient agreed with the plan and demonstrated an understanding of the instructions.   The patient was advised to call back or seek an in-person evaluation if the symptoms worsen or if the condition fails to improve as anticipated.  The above assessment and management plan was discussed with the patient. The patient verbalized understanding of and has agreed to the management plan. Patient is aware to call the clinic if symptoms persist or worsen. Patient is aware when to return to the clinic for a follow-up visit. Patient educated on when it is appropriate to go to the emergency department.   Time call ended:  10:10  I provided 20 minutes of non-face-to-face time during this encounter.    [name], FNP

## 2018-07-15 ENCOUNTER — Ambulatory Visit (INDEPENDENT_AMBULATORY_CARE_PROVIDER_SITE_OTHER): Payer: 59 | Admitting: Physician Assistant

## 2018-07-15 ENCOUNTER — Encounter: Payer: Self-pay | Admitting: Physician Assistant

## 2018-07-15 ENCOUNTER — Other Ambulatory Visit: Payer: Self-pay

## 2018-07-15 DIAGNOSIS — J0110 Acute frontal sinusitis, unspecified: Secondary | ICD-10-CM | POA: Diagnosis not present

## 2018-07-15 MED ORDER — AMOXICILLIN-POT CLAVULANATE 875-125 MG PO TABS: 1.0000 | ORAL_TABLET | Freq: Two times a day (BID) | ORAL | 0 refills | Status: DC

## 2018-07-15 NOTE — Progress Notes (Signed)
Telephone visit  Subjective: TO:IZTIW pain PCP: Chevis Pretty, FNP PYK:DXIPJ Solis Stacie is a 58 y.o. female calls for telephone consult today. Patient provides verbal consent for consult held via phone.  Patient is identified with 2 separate identifiers.  At this time the entire area is on COVID-19 social distancing and stay home orders are in place.  Patient is of higher risk and therefore we are performing this by a virtual method.  Location of patient: home Location of provider: [facility] Others present for call: no  This patient has had greater than one week of sinus headache and postnasal drainage. There is copious drainage at times, and is now yellow. Denies any fever at this time. There has been a history of sinus infections in the past.  No history of sinus surgery. There is cough at night. It has become more prevalent in recent days. Sore on the inside of her nose that will bleed  She has been trying Allegra-D, Nasacort Sudafed.   ROS: Per HPI  No Known Allergies Past Medical History:  Diagnosis Date  . Allergy   . Anxiety   . Chronic back pain   . GERD (gastroesophageal reflux disease)   . Hyperlipidemia     Current Outpatient Medications:  .  albuterol (PROVENTIL HFA;VENTOLIN HFA) 108 (90 Base) MCG/ACT inhaler, USE 2 PUFFS EVERY 4 HOURS AS NEEDED, Disp: 8.5 Inhaler, Rfl: 3 .  amoxicillin-clavulanate (AUGMENTIN) 875-125 MG tablet, Take 1 tablet by mouth 2 (two) times daily., Disp: 20 tablet, Rfl: 0 .  atorvastatin (LIPITOR) 40 MG tablet, TAKE 1 TABLET BY MOUTH EVERY DAY (Needs to be seen before next refill), Disp: 90 tablet, Rfl: 1 .  cholecalciferol (VITAMIN D) 1000 UNITS tablet, Take 1,000 Units by mouth daily., Disp: , Rfl:  .  Choline Fenofibrate (FENOFIBRIC ACID) 135 MG CPDR, Take 1 tablet by mouth daily., Disp: 90 capsule, Rfl: 1 .  cyclobenzaprine (FLEXERIL) 10 MG tablet, TAKE 1 TABLET BY MOUTH THREE TIMES A DAY AS NEEDED FOR MUSCLE SPASMS, Disp: 90  tablet, Rfl: 1 .  escitalopram (LEXAPRO) 20 MG tablet, Take 1 tablet (20 mg total) by mouth daily., Disp: 90 tablet, Rfl: 1 .  fish oil-omega-3 fatty acids 1000 MG capsule, Take 2 g by mouth 2 (two) times daily., Disp: , Rfl:  .  furosemide (LASIX) 40 MG tablet, Take 1 tablet (40 mg total) by mouth daily., Disp: 90 tablet, Rfl: 1 .  omeprazole (PRILOSEC) 40 MG capsule, Take 1 capsule (40 mg total) by mouth daily., Disp: 90 capsule, Rfl: 1 .  pramipexole (MIRAPEX) 0.5 MG tablet, Take 1 tablet (0.5 mg total) by mouth 3 (three) times daily., Disp: 270 tablet, Rfl: 1 .  Triamcinolone Acetonide (NASACORT AQ NA), Place into the nose., Disp: , Rfl:  .  zolpidem (AMBIEN) 10 MG tablet, Take 1 tablet (10 mg total) by mouth at bedtime as needed for up to 30 days for sleep., Disp: 30 tablet, Rfl: 2  Assessment/ Plan: 58 y.o. female   1. Acute non-recurrent frontal sinusitis - amoxicillin-clavulanate (AUGMENTIN) 875-125 MG tablet; Take 1 tablet by mouth 2 (two) times daily.  Dispense: 20 tablet; Refill: 0   Start time: 3:02 PM. End time: 3:09 PM  Meds ordered this encounter  Medications  . amoxicillin-clavulanate (AUGMENTIN) 875-125 MG tablet    Sig: Take 1 tablet by mouth 2 (two) times daily.    Dispense:  20 tablet    Refill:  0    Order Specific Question:  Supervising Provider    Answer:   Janora Norlander [[id number]]    Particia Nearing PA-C [place] [phone]

## 2018-07-17 ENCOUNTER — Other Ambulatory Visit: Payer: Self-pay | Admitting: Nurse Practitioner

## 2018-07-17 DIAGNOSIS — F5101 Primary insomnia: Secondary | ICD-10-CM

## 2018-07-30 ENCOUNTER — Other Ambulatory Visit: Payer: Self-pay | Admitting: Nurse Practitioner

## 2018-07-30 DIAGNOSIS — F3342 Major depressive disorder, recurrent, in full remission: Secondary | ICD-10-CM

## 2018-09-04 ENCOUNTER — Other Ambulatory Visit: Payer: Self-pay | Admitting: Nurse Practitioner

## 2018-09-04 DIAGNOSIS — F3342 Major depressive disorder, recurrent, in full remission: Secondary | ICD-10-CM

## 2018-09-05 ENCOUNTER — Telehealth: Payer: Self-pay | Admitting: Nurse Practitioner

## 2018-09-05 MED ORDER — ZOLPIDEM TARTRATE 10 MG PO TABS: 10.0000 mg | ORAL_TABLET | Freq: Every evening | ORAL | 2 refills | Status: DC | PRN

## 2018-09-05 NOTE — Telephone Encounter (Signed)
No cannot do 90 day supply of control substances- refill sent to pharmacy

## 2018-09-05 NOTE — Telephone Encounter (Signed)
In MMMs in basket. She will address

## 2018-09-05 NOTE — Telephone Encounter (Signed)
Patient aware.

## 2018-09-23 ENCOUNTER — Telehealth: Payer: Self-pay | Admitting: Nurse Practitioner

## 2018-09-24 NOTE — Telephone Encounter (Signed)
Appt made

## 2018-09-27 ENCOUNTER — Other Ambulatory Visit: Payer: Self-pay | Admitting: Nurse Practitioner

## 2018-09-27 DIAGNOSIS — G8929 Other chronic pain: Secondary | ICD-10-CM

## 2018-09-29 ENCOUNTER — Telehealth: Payer: Self-pay | Admitting: Nurse Practitioner

## 2018-09-29 ENCOUNTER — Other Ambulatory Visit: Payer: Self-pay

## 2018-09-29 NOTE — Telephone Encounter (Signed)
Please schedule her for appointment to,orrow

## 2018-09-29 NOTE — Telephone Encounter (Signed)
televisit is fine for tomorrow

## 2018-09-30 ENCOUNTER — Ambulatory Visit (INDEPENDENT_AMBULATORY_CARE_PROVIDER_SITE_OTHER): Payer: 59 | Admitting: Nurse Practitioner

## 2018-09-30 ENCOUNTER — Encounter: Payer: Self-pay | Admitting: Nurse Practitioner

## 2018-09-30 VITALS — BP 134/81 | HR 86 | Temp 96.8°F | Ht 62.0 in | Wt 193.0 lb

## 2018-09-30 DIAGNOSIS — M67432 Ganglion, left wrist: Secondary | ICD-10-CM | POA: Diagnosis not present

## 2018-09-30 MED ORDER — TRIAMCINOLONE ACETONIDE 55 MCG/ACT NA AERO: 2.0000 | INHALATION_SPRAY | Freq: Every day | NASAL | 12 refills | Status: DC

## 2018-09-30 NOTE — Progress Notes (Signed)
   Subjective:    Patient ID: Stacie Solis, female    DOB: 10-Jun-1960, 58 y.o.   MRN: [id number]   Chief Complaint: Cyst on left wrist   HPI  Patient come sin c/o knot on left wrist. She noticed it 3 weeks ago. Denies any pain. Has not changed in size since she noticed it.   Review of Systems  Constitutional: Negative for activity change and appetite change.  HENT: Negative.   Eyes: Negative for pain.  Respiratory: Negative for shortness of breath.   Cardiovascular: Negative for chest pain, palpitations and leg swelling.  Gastrointestinal: Negative for abdominal pain.  Endocrine: Negative for polydipsia.  Genitourinary: Negative.   Skin: Negative for rash.  Neurological: Negative for dizziness, weakness and headaches.  Hematological: Does not bruise/bleed easily.  Psychiatric/Behavioral: Negative.   All other systems reviewed and are negative.      Objective:   Physical Exam Vitals signs and nursing note reviewed.  Constitutional:      Appearance: Normal appearance.  Cardiovascular:     Rate and Rhythm: Normal rate and regular rhythm.     Heart sounds: Normal heart sounds.  Pulmonary:     Breath sounds: Normal breath sounds.  Musculoskeletal:        General: No deformity.     Comments: 3cm raised nontender nodule left palm surface of wrist  Skin:    General: Skin is warm.  Neurological:     General: No focal deficit present.     Mental Status: She is alert and oriented to person, place, and time.    BP 134/81   Pulse 86   Temp (!) 96.8 F (36 C) (Oral)   Ht 5\' 2"  (1.575 m)   Wt 193 lb (87.5 kg)   BMI 35.30 kg/m         Assessment & Plan:  Stacie Solis in today with chief complaint of Cyst on left wrist   1. Ganglion of left wrist Just watch Will not do anything unless causes pain RTO prn  [name], FNP

## 2018-09-30 NOTE — Patient Instructions (Signed)
Ganglion Cyst  A ganglion cyst is a non-cancerous, fluid-filled lump that occurs near a joint or tendon. The cyst grows out of a joint or the lining of a tendon. Ganglion cysts most often develop in the hand or wrist, but they can also develop in the shoulder, elbow, hip, knee, ankle, or foot. Ganglion cysts are ball-shaped or egg-shaped. Their size can range from the size of a pea to larger than a grape. Increased activity may cause the cyst to get bigger because more fluid starts to build up. What are the causes? The exact cause of this condition is not known, but it may be related to:  Inflammation or irritation around the joint.  An injury.  Repetitive movements or overuse.  Arthritis. What increases the risk? You are more likely to develop this condition if:  You are a woman.  You are 40-59 years old. What are the signs or symptoms? The main symptom of this condition is a lump. It most often appears on the hand or wrist. In many cases, there are no other symptoms, but a cyst can sometimes cause:  Tingling.  Pain.  Numbness.  Muscle weakness.  Weak grip.  Less range of motion in a joint. How is this diagnosed? Ganglion cysts are usually diagnosed based on a physical exam. Your health care provider will feel the lump and may shine a light next to it. If it is a ganglion cyst, the light will likely shine through it. Your health care provider may order an X-ray, ultrasound, or MRI to rule out other conditions. How is this treated? Ganglion cysts often go away on their own without treatment. If you have pain or other symptoms, treatment may be needed. Treatment is also needed if the ganglion cyst limits your movement or if it gets infected. Treatment may include:  Wearing a brace or splint on your wrist or finger.  Taking anti-inflammatory medicine.  Having fluid drained from the lump with a needle (aspiration).  Getting a steroid injected into the joint.  Having  surgery to remove the ganglion cyst.  Placing a pad on your shoe or wearing shoes that will not rub against the cyst if it is on your foot. Follow these instructions at home:  Do not press on the ganglion cyst, poke it with a needle, or hit it.  Take over-the-counter and prescription medicines only as told by your health care provider.  If you have a brace or splint: ? Wear it as told by your health care provider. ? Remove it as told by your health care provider. Ask if you need to remove it when you take a shower or a bath.  Watch your ganglion cyst for any changes.  Keep all follow-up visits as told by your health care provider. This is important. Contact a health care provider if:  Your ganglion cyst becomes larger or more painful.  You have pus coming from the lump.  You have weakness or numbness in the affected area.  You have a fever or chills. Get help right away if:  You have a fever and have any of these in the cyst area: ? Increased redness. ? Red streaks. ? Swelling. Summary  A ganglion cyst is a non-cancerous, fluid-filled lump that occurs near a joint or tendon.  Ganglion cysts most often develop in the hand or wrist, but they can also develop in the shoulder, elbow, hip, knee, ankle, or foot.  Ganglion cysts often go away on their own without treatment.  This information is not intended to replace advice given to you by your health care provider. Make sure you discuss any questions you have with your health care provider. Document Released: 02/10/2000 Document Revised: 01/25/2017 Document Reviewed: 10/12/2016 Elsevier Patient Education  2020 [employer].

## 2018-09-30 NOTE — Addendum Note (Signed)
Addended by: Chevis Pretty on: 09/30/2018 09:14 AM   Modules accepted: Orders

## 2018-10-13 ENCOUNTER — Ambulatory Visit (INDEPENDENT_AMBULATORY_CARE_PROVIDER_SITE_OTHER): Payer: 59 | Admitting: Nurse Practitioner

## 2018-10-13 ENCOUNTER — Encounter: Payer: Self-pay | Admitting: Nurse Practitioner

## 2018-10-13 ENCOUNTER — Other Ambulatory Visit: Payer: Self-pay

## 2018-10-13 DIAGNOSIS — J0100 Acute maxillary sinusitis, unspecified: Secondary | ICD-10-CM

## 2018-10-13 MED ORDER — AMOXICILLIN-POT CLAVULANATE 875-125 MG PO TABS: 1.0000 | ORAL_TABLET | Freq: Two times a day (BID) | ORAL | 0 refills | Status: DC

## 2018-10-13 MED ORDER — BENZONATATE 100 MG PO CAPS: 100.0000 mg | ORAL_CAPSULE | Freq: Three times a day (TID) | ORAL | 0 refills | Status: DC | PRN

## 2018-10-13 MED ORDER — FLUCONAZOLE 150 MG PO TABS: 150.0000 mg | ORAL_TABLET | Freq: Once | ORAL | 0 refills | Status: AC

## 2018-10-13 NOTE — Progress Notes (Signed)
Virtual Visit via telephone Note Due to COVID-19 pandemic this visit was conducted virtually. This visit type was conducted due to national recommendations for restrictions regarding the COVID-19 Pandemic (e.g. social distancing, sheltering in place) in an effort to limit this patient's exposure and mitigate transmission in our community. All issues noted in this document were discussed and addressed.  A physical exam was not performed with this format.  I connected with Stacie Solis on 10/13/18 at 10:15 by telephone and verified that I am speaking with the correct person using two identifiers. Stacie Solis is currently located at home and her boyfriend is currently with her during visit. The provider, [name], FNP is located in their office at time of visit.  I discussed the limitations, risks, security and privacy concerns of performing an evaluation and management service by telephone and the availability of in person appointments. I also discussed with the patient that there may be a patient responsible charge related to this service. The patient expressed understanding and agreed to proceed.   History and Present Illness:   Chief Complaint: Sinusitis   HPI Patient calls in today for telephone visit. She is c/o headahce and sore throat. This started about 5 days ago. Now she has a left ear ache. Denies fever even though she feels feverish.. Slight body aches. She deneis being exposed  To covid.    Review of Systems  Constitutional: Positive for chills and fever.  HENT: Positive for congestion, ear pain, sinus pain and sore throat.   Respiratory: Negative for cough.   Cardiovascular: Negative.   Genitourinary: Negative.   Neurological: Positive for dizziness and headaches.  Psychiatric/Behavioral: Negative.   All other systems reviewed and are negative.    Observations/Objective: Alert and oriented- answers all questions appropriately No distress Voice is raspy  Assessment and Plan: Stacie Solis in today with chief complaint of Sinusitis   1. Acute maxillary sinusitis, recurrence not specified 1. Take meds as prescribed 2. Use a cool mist humidifier especially during the winter months and when heat has been humid. 3. Use saline nose sprays frequently 4. Saline irrigations of the nose can be very helpful if done frequently.  * 4X daily for 1 week*  * Use of a nettie pot can be helpful with this. Follow directions with this* 5. Drink plenty of fluids 6. Keep thermostat turn down low 7.For any cough or congestion  Use plain Mucinex- regular strength or max strength is fine   * Children- consult with Pharmacist for dosing 8. For fever or aces or pains- take tylenol or ibuprofen appropriate for age and weight.  * for fevers greater than 101 orally you may alternate ibuprofen and tylenol every  3 hours.    - amoxicillin-clavulanate (AUGMENTIN) 875-125 MG tablet; Take 1 tablet by mouth 2 (two) times daily.  Dispense: 14 tablet; Refill: 0 - benzonatate (TESSALON PERLES) 100 MG capsule; Take 1 capsule (100 mg total) by mouth 3 (three) times daily as needed for cough.  Dispense: 20 capsule; Refill: 0 - fluconazole (DIFLUCAN) 150 MG tablet; Take 1 tablet (150 mg total) by mouth once for 1 dose.  Dispense: 1 tablet; Refill: 0   Follow Up Instructions: *prn**    I discussed the assessment and treatment plan with the patient. The patient was provided an opportunity to ask questions and all were answered. The patient agreed with the plan and demonstrated an understanding of the instructions.   The patient was advised to call back  or seek an in-person evaluation if the symptoms worsen or if the condition fails to improve as anticipated.  The above assessment and management plan was discussed with the patient. The patient verbalized understanding of and has agreed to the management plan. Patient is aware to call the clinic if symptoms persist or worsen.  Patient is aware when to return to the clinic for a follow-up visit. Patient educated on when it is appropriate to go to the emergency department.   Time call ended:  10:27  I provided 12 minutes of non-face-to-face time during this encounter.    [name], FNP

## 2018-10-17 ENCOUNTER — Ambulatory Visit: Payer: 59 | Admitting: Nurse Practitioner

## 2018-11-17 ENCOUNTER — Other Ambulatory Visit: Payer: Self-pay

## 2018-11-18 ENCOUNTER — Encounter: Payer: Self-pay | Admitting: Nurse Practitioner

## 2018-11-18 ENCOUNTER — Ambulatory Visit (INDEPENDENT_AMBULATORY_CARE_PROVIDER_SITE_OTHER): Payer: 59 | Admitting: Nurse Practitioner

## 2018-11-18 VITALS — BP 124/76 | HR 92 | Temp 98.6°F | Ht 62.0 in | Wt 185.0 lb

## 2018-11-18 DIAGNOSIS — Z6835 Body mass index (BMI) 35.0-35.9, adult: Secondary | ICD-10-CM

## 2018-11-18 DIAGNOSIS — F17200 Nicotine dependence, unspecified, uncomplicated: Secondary | ICD-10-CM

## 2018-11-18 DIAGNOSIS — G500 Trigeminal neuralgia: Secondary | ICD-10-CM | POA: Diagnosis not present

## 2018-11-18 DIAGNOSIS — E782 Mixed hyperlipidemia: Secondary | ICD-10-CM | POA: Diagnosis not present

## 2018-11-18 DIAGNOSIS — G2581 Restless legs syndrome: Secondary | ICD-10-CM

## 2018-11-18 DIAGNOSIS — F5101 Primary insomnia: Secondary | ICD-10-CM

## 2018-11-18 DIAGNOSIS — R600 Localized edema: Secondary | ICD-10-CM

## 2018-11-18 DIAGNOSIS — M545 Low back pain: Secondary | ICD-10-CM

## 2018-11-18 DIAGNOSIS — F3342 Major depressive disorder, recurrent, in full remission: Secondary | ICD-10-CM

## 2018-11-18 DIAGNOSIS — K219 Gastro-esophageal reflux disease without esophagitis: Secondary | ICD-10-CM | POA: Diagnosis not present

## 2018-11-18 DIAGNOSIS — F410 Panic disorder [episodic paroxysmal anxiety] without agoraphobia: Secondary | ICD-10-CM

## 2018-11-18 DIAGNOSIS — G8929 Other chronic pain: Secondary | ICD-10-CM

## 2018-11-18 DIAGNOSIS — R609 Edema, unspecified: Secondary | ICD-10-CM

## 2018-11-18 MED ORDER — ATORVASTATIN CALCIUM 40 MG PO TABS: ORAL_TABLET | ORAL | 1 refills | Status: DC

## 2018-11-18 MED ORDER — OMEPRAZOLE 40 MG PO CPDR: 40.0000 mg | DELAYED_RELEASE_CAPSULE | Freq: Every day | ORAL | 1 refills | Status: DC

## 2018-11-18 MED ORDER — PRAMIPEXOLE DIHYDROCHLORIDE 0.5 MG PO TABS: 0.5000 mg | ORAL_TABLET | Freq: Three times a day (TID) | ORAL | 1 refills | Status: DC

## 2018-11-18 MED ORDER — ESCITALOPRAM OXALATE 20 MG PO TABS: 20.0000 mg | ORAL_TABLET | Freq: Every day | ORAL | 1 refills | Status: DC

## 2018-11-18 MED ORDER — ZOLPIDEM TARTRATE 10 MG PO TABS: 10.0000 mg | ORAL_TABLET | Freq: Every evening | ORAL | 2 refills | Status: DC | PRN

## 2018-11-18 MED ORDER — FENOFIBRIC ACID 135 MG PO CPDR: 1.0000 | DELAYED_RELEASE_CAPSULE | Freq: Every day | ORAL | 1 refills | Status: DC

## 2018-11-18 MED ORDER — FUROSEMIDE 40 MG PO TABS: 40.0000 mg | ORAL_TABLET | Freq: Every day | ORAL | 1 refills | Status: DC

## 2018-11-18 NOTE — Progress Notes (Signed)
Subjective:    Patient ID: Stacie Solis, female    DOB: 1960-10-26, 58 y.o.   MRN: [id number]   Chief Complaint: Medical Management of Chronic Issues    HPI:  1. Mixed hyperlipidemia Does not watch diet and does no exercise. Lab Results  Component Value Date   CHOL 110 01/01/2018   HDL 31 (L) 01/01/2018   LDLCALC 53 01/01/2018   TRIG 132 01/01/2018   CHOLHDL 3.5 01/01/2018     2. Gastroesophageal reflux disease without esophagitis Is on omeprazole and is doing well.  3. Trigeminal neuralgia Has really not been bothering hr lately.  4. Chronic midline low back pain without sciatica She has constant back pain. She goes to pain management and is on hydrocodone 10/325 which helps  5. Recurrent major depressive disorder, in full remission [facility]) She has had major depression since her mom died several years ago. She is currnently on lexapro and says she is doing better. Depression screen [facility] 2/9 11/18/2018 09/30/2018 07/01/2018  Decreased Interest 3 0 0  Down, Depressed, Hopeless 3 0 0  PHQ - 2 Score 6 0 0  Altered sleeping 3 - -  Tired, decreased energy 3 - -  Change in appetite 1 - -  Feeling bad or failure about yourself  0 - -  Trouble concentrating 1 - -  Moving slowly or fidgety/restless 0 - -  Suicidal thoughts 0 - -  PHQ-9 Score 14 - -  Difficult doing work/chores Somewhat difficult - -     6. Primary insomnia Is on ambien nightly and says she rest well when she takes it.  7. Panic attacks Has not had any recent panic attakcs  8. RLS (restless legs syndrome) Is on mirapex nightly and is working well for her.  9. Smoker Smokes over a pack a day  10. BMI 35.0-35.9,adult No recent weight changes. Wt Readings from Last 3 Encounters:  11/18/18 185 lb (83.9 kg)  09/30/18 193 lb (87.5 kg)  01/30/18 203 lb (92.1 kg)   BMI Readings from Last 3 Encounters:  11/18/18 33.84 kg/m  09/30/18 35.30 kg/m  01/30/18 37.13 kg/m       Outpatient Encounter  Medications as of 11/18/2018  Medication Sig   albuterol (PROVENTIL HFA;VENTOLIN HFA) 108 (90 Base) MCG/ACT inhaler USE 2 PUFFS EVERY 4 HOURS AS NEEDED   atorvastatin (LIPITOR) 40 MG tablet TAKE 1 TABLET BY MOUTH EVERY DAY (Needs to be seen before next refill)   cholecalciferol (VITAMIN D) 1000 UNITS tablet Take 1,000 Units by mouth daily.   Choline Fenofibrate (FENOFIBRIC ACID) 135 MG CPDR Take 1 tablet by mouth daily.   cyclobenzaprine (FLEXERIL) 10 MG tablet TAKE 1 TABLET BY MOUTH THREE TIMES A DAY AS NEEDED FOR MUSCLE SPASMS   escitalopram (LEXAPRO) 20 MG tablet Take 1 tablet (20 mg total) by mouth daily.   fish oil-omega-3 fatty acids 1000 MG capsule Take 2 g by mouth 2 (two) times daily.   furosemide (LASIX) 40 MG tablet Take 1 tablet (40 mg total) by mouth daily.   omeprazole (PRILOSEC) 40 MG capsule Take 1 capsule (40 mg total) by mouth daily.   pramipexole (MIRAPEX) 0.5 MG tablet Take 1 tablet (0.5 mg total) by mouth 3 (three) times daily.   triamcinolone (NASACORT) 55 MCG/ACT AERO nasal inhaler Place 2 sprays into the nose daily.   zolpidem (AMBIEN) 10 MG tablet Take 1 tablet (10 mg total) by mouth at bedtime as needed for sleep.     Past  Surgical History:  Procedure Laterality Date   BRAIN SURGERY     trig neuralgia   CHOLECYSTECTOMY      Family History  Problem Relation Age of Onset   Hypertension Mother    Diabetes Mother    Arthritis Mother    Lung cancer Maternal Grandfather     New complaints: She is very tearful today- will not tell me what is wrong.  Just says itis family problems and it will get better  Social history: Lives with her husband. Spends time with her sisters when she can  Controlled substance contract: 11/18/18    Review of Systems  Constitutional: Negative for activity change and appetite change.  HENT: Negative.   Eyes: Negative for pain.  Respiratory: Negative for shortness of breath.   Cardiovascular: Negative for  chest pain, palpitations and leg swelling.  Gastrointestinal: Negative for abdominal pain.  Endocrine: Negative for polydipsia.  Genitourinary: Negative.   Skin: Negative for rash.  Neurological: Negative for dizziness, weakness and headaches.  Hematological: Does not bruise/bleed easily.  Psychiatric/Behavioral: Negative.   All other systems reviewed and are negative.      Objective:   Physical Exam Vitals signs and nursing note reviewed.  Constitutional:      General: She is not in acute distress.    Appearance: Normal appearance. She is well-developed.  HENT:     Head: Normocephalic.     Nose: Nose normal.  Eyes:     Pupils: Pupils are equal, round, and reactive to light.  Neck:     Musculoskeletal: Normal range of motion and neck supple.     Vascular: No carotid bruit or JVD.  Cardiovascular:     Rate and Rhythm: Normal rate and regular rhythm.     Heart sounds: Normal heart sounds.  Pulmonary:     Effort: Pulmonary effort is normal. No respiratory distress.     Breath sounds: Normal breath sounds. No wheezing or rales.  Chest:     Chest wall: No tenderness.  Abdominal:     General: Bowel sounds are normal. There is no distension or abdominal bruit.     Palpations: Abdomen is soft. There is no hepatomegaly, splenomegaly, mass or pulsatile mass.     Tenderness: There is no abdominal tenderness.  Musculoskeletal: Normal range of motion.  Lymphadenopathy:     Cervical: No cervical adenopathy.  Skin:    General: Skin is warm and dry.  Neurological:     Mental Status: She is alert and oriented to person, place, and time.     Deep Tendon Reflexes: Reflexes are normal and symmetric.  Psychiatric:        Behavior: Behavior normal.        Thought Content: Thought content normal.        Judgment: Judgment normal.     Comments: Tearful today    BP 124/76    Pulse 92    Temp 98.6 F (37 C) (Oral)    Ht 5\' 2"  (1.575 m)    Wt 185 lb (83.9 kg)    SpO2 97%    BMI 33.84 kg/m          Assessment & Plan:  Stacie Solis comes in today with chief complaint of Medical Management of Chronic Issues   Diagnosis and orders addressed:  1. Mixed hyperlipidemia Low sodium diet - Choline Fenofibrate (FENOFIBRIC ACID) 135 MG CPDR; Take 1 tablet by mouth daily.  Dispense: 90 capsule; Refill: 1 - atorvastatin (LIPITOR) 40 MG tablet;  TAKE 1 TABLET BY MOUTH EVERY DAY (Needs to be seen before next refill)  Dispense: 90 tablet; Refill: 1  2. Gastroesophageal reflux disease without esophagitis Avoid spicy foods Do not eat 2 hours prior to bedtime - omeprazole (PRILOSEC) 40 MG capsule; Take 1 capsule (40 mg total) by mouth daily.  Dispense: 90 capsule; Refill: 1  3. Trigeminal neuralgia  4. Chronic midline low back pain without sciatica [facility]  5. Recurrent major depressive disorder, in full remission [facility]) Stress management' encouraged counseling - escitalopram (LEXAPRO) 20 MG tablet; Take 1 tablet (20 mg total) by mouth daily.  Dispense: 90 tablet; Refill: 1  6. Primary insomnia Bedtime routine - zolpidem (AMBIEN) 10 MG tablet; Take 1 tablet (10 mg total) by mouth at bedtime as needed for sleep.  Dispense: 30 tablet; Refill: 2  7. Panic attacks  8. RLS (restless legs syndrome) Keep  Legs warm at night - pramipexole (MIRAPEX) 0.5 MG tablet; Take 1 tablet (0.5 mg total) by mouth 3 (three) times daily.  Dispense: 270 tablet; Refill: 1  9. Smoker Smoking cessation encourgaed  10. BMI 35.0-35.9,adult Discussed diet and exercise for person with BMI >25 Will recheck weight in 3-6 months  11. Peripheral edema elevate legs when sitting - furosemide (LASIX) 40 MG tablet; Take 1 tablet (40 mg total) by mouth daily.  Dispense: 90 tablet; Refill: 1   Labs pending Health Maintenance reviewed Diet and exercise encouraged  Follow up plan: 6 months   [name], FNP

## 2018-11-18 NOTE — Patient Instructions (Signed)

## 2018-11-19 LAB — CMP14+EGFR
ALT: 28 IU/L (ref 0–32)
AST: 24 IU/L (ref 0–40)
Albumin/Globulin Ratio: 2 (ref 1.2–2.2)
Albumin: 4.8 g/dL (ref 3.8–4.9)
Alkaline Phosphatase: 81 IU/L (ref 39–117)
BUN/Creatinine Ratio: 12 (ref 12–28)
BUN: 14 mg/dL (ref 8–27)
Bilirubin Total: 0.5 mg/dL (ref 0.0–1.2)
CO2: 25 mmol/L (ref 20–29)
Calcium: 9.7 mg/dL (ref 8.7–10.3)
Chloride: 99 mmol/L (ref 96–106)
Creatinine, Ser: 1.15 mg/dL — ABNORMAL HIGH (ref 0.57–1.00)
GFR calc Af Amer: 60 mL/min/{1.73_m2} (ref >59)
GFR calc non Af Amer: 52 mL/min/{1.73_m2} — ABNORMAL LOW (ref >59)
Globulin, Total: 2.4 g/dL (ref 1.5–4.5)
Glucose: 87 mg/dL (ref 65–99)
Potassium: 4.4 mmol/L (ref 3.5–5.2)
Sodium: 141 mmol/L (ref 134–144)
Total Protein: 7.2 g/dL (ref 6.0–8.5)

## 2018-11-19 LAB — LIPID PANEL
Chol/HDL Ratio: 3.2 ratio (ref 0.0–4.4)
Cholesterol, Total: 107 mg/dL (ref 100–199)
HDL: 33 mg/dL — ABNORMAL LOW (ref >39)
LDL Chol Calc (NIH): 56 mg/dL (ref 0–99)
Triglycerides: 95 mg/dL (ref 0–149)
VLDL Cholesterol Cal: 18 mg/dL (ref 5–40)

## 2018-12-28 ENCOUNTER — Other Ambulatory Visit: Payer: Self-pay | Admitting: Nurse Practitioner

## 2018-12-28 DIAGNOSIS — F3342 Major depressive disorder, recurrent, in full remission: Secondary | ICD-10-CM

## 2019-03-08 ENCOUNTER — Other Ambulatory Visit: Payer: Self-pay | Admitting: Nurse Practitioner

## 2019-03-08 DIAGNOSIS — F3342 Major depressive disorder, recurrent, in full remission: Secondary | ICD-10-CM

## 2019-03-08 DIAGNOSIS — F5101 Primary insomnia: Secondary | ICD-10-CM

## 2019-03-14 ENCOUNTER — Telehealth: Payer: 59 | Admitting: Physician Assistant

## 2019-03-14 DIAGNOSIS — B9689 Other specified bacterial agents as the cause of diseases classified elsewhere: Secondary | ICD-10-CM | POA: Diagnosis not present

## 2019-03-14 DIAGNOSIS — J329 Chronic sinusitis, unspecified: Secondary | ICD-10-CM | POA: Diagnosis not present

## 2019-03-14 MED ORDER — AMOXICILLIN-POT CLAVULANATE 875-125 MG PO TABS: 1.0000 | ORAL_TABLET | Freq: Two times a day (BID) | ORAL | 0 refills | Status: DC

## 2019-03-14 NOTE — Progress Notes (Signed)
All extremities well We are sorry that you are not feeling well.  Here is how we plan to help!  Based on what you have shared with me it looks like you have sinusitis.  Sinusitis is inflammation and infection in the sinus cavities of the head.  Based on your presentation I believe you most likely have Acute Bacterial Sinusitis.  This is an infection caused by bacteria and is treated with antibiotics. I have prescribed Augmentin 875mg /125mg  one tablet twice daily with food, for 7 days. You may use an oral decongestant such as Mucinex D or if you have glaucoma or high blood pressure use plain Mucinex. Saline nasal spray help and can safely be used as often as needed for congestion.  If you develop worsening sinus pain, fever or notice severe headache and vision changes, or if symptoms are not better after completion of antibiotic, please schedule an appointment with a health care provider.    Sinus infections are not as easily transmitted as other respiratory infection, however we still recommend that you avoid close contact with loved ones, especially the very young and elderly.  Remember to wash your hands thoroughly throughout the day as this is the number one way to prevent the spread of infection!  Home Care:  Only take medications as instructed by your medical team.  Complete the entire course of an antibiotic.  Do not take these medications with alcohol.  A steam or ultrasonic humidifier can help congestion.  You can place a towel over your head and breathe in the steam from hot water coming from a faucet.  Avoid close contacts especially the very young and the elderly.  Cover your mouth when you cough or sneeze.  Always remember to wash your hands.  Get Help Right Away If:  You develop worsening fever or sinus pain.  You develop a severe head ache or visual changes.  Your symptoms persist after you have completed your treatment plan.  Make sure you  Understand these  instructions.  Will watch your condition.  Will get help right away if you are not doing well or get worse.  Your e-visit answers were reviewed by a board certified advanced clinical practitioner to complete your personal care plan.  Depending on the condition, your plan could have included both over the counter or prescription medications.  If there is a problem please reply  once you have received a response from your provider.  Your safety is important to [place].  If you have drug allergies check your prescription carefully.    You can use MyChart to ask questions about today's visit, request a non-urgent call back, or ask for a work or school excuse for 24 hours related to this e-Visit. If it has been greater than 24 hours you will need to follow up with your provider, or enter a new e-Visit to address those concerns.  You will get an e-mail in the next two days asking about your experience.  I hope that your e-visit has been valuable and will speed your recovery. Thank you for using e-visits.  Particia Nearing PA-C

## 2019-04-13 ENCOUNTER — Encounter: Payer: Self-pay | Admitting: Nurse Practitioner

## 2019-04-13 ENCOUNTER — Ambulatory Visit (INDEPENDENT_AMBULATORY_CARE_PROVIDER_SITE_OTHER): Payer: 59 | Admitting: Nurse Practitioner

## 2019-04-13 DIAGNOSIS — F3342 Major depressive disorder, recurrent, in full remission: Secondary | ICD-10-CM

## 2019-04-13 DIAGNOSIS — F411 Generalized anxiety disorder: Secondary | ICD-10-CM | POA: Diagnosis not present

## 2019-04-13 MED ORDER — BUPROPION HCL ER (XL) 150 MG PO TB24: 150.0000 mg | ORAL_TABLET | Freq: Every day | ORAL | 3 refills | Status: DC

## 2019-04-13 MED ORDER — BUSPIRONE HCL 5 MG PO TABS: 5.0000 mg | ORAL_TABLET | Freq: Three times a day (TID) | ORAL | 2 refills | Status: DC

## 2019-04-13 NOTE — Progress Notes (Signed)
Virtual Visit via telephone Note Due to COVID-19 pandemic this visit was conducted virtually. This visit type was conducted due to national recommendations for restrictions regarding the COVID-19 Pandemic (e.g. social distancing, sheltering in place) in an effort to limit this patient's exposure and mitigate transmission in our community. All issues noted in this document were discussed and addressed.  A physical exam was not performed with this format.  I connected with Stacie Solis on 04/13/19 at 12:35 by telephone and verified that I am speaking with the correct person using two identifiers. Stacie Solis is currently located at home and no one is currently with her during visit. The provider, [name], FNP is located in their office at time of visit.  I discussed the limitations, risks, security and privacy concerns of performing an evaluation and management service by telephone and the availability of in person appointments. I also discussed with the patient that there may be a patient responsible charge related to this service. The patient expressed understanding and agreed to proceed.   History and Present Illness:   Chief Complaint: Anxiety   HPI Patient calls in today c/o anxiety. She is on lexapro 20mg  , which was increased from 10mg  3 months ago. She says that it has not helped. She says her depression and anxiety has not improved. She is under a lot of stress with family situations that she refuses to talk about.   GAD 7 : Generalized Anxiety Score 04/13/2019  Nervous, Anxious, on Edge 3  Control/stop worrying 3  Worry too much - different things 3  Trouble relaxing 3  Restless 2  Easily annoyed or irritable 2  Afraid - awful might happen 2  Total GAD 7 Score 18  Anxiety Difficulty Very difficult    Depression screen [facility] 2/9 04/13/2019 11/18/2018 09/30/2018  Decreased Interest 1 3 0  Down, Depressed, Hopeless 2 3 0  PHQ - 2 Score 3 6 0  Altered sleeping 2 3 -    Tired, decreased energy 3 3 -  Change in appetite 2 1 -  Feeling bad or failure about yourself  1 0 -  Trouble concentrating 2 1 -  Moving slowly or fidgety/restless - 0 -  Suicidal thoughts 0 0 -  PHQ-9 Score 13 14 -  Difficult doing work/chores Very difficult Somewhat difficult -       Review of Systems  Constitutional: Negative for diaphoresis and weight loss.  Eyes: Negative for blurred vision, double vision and pain.  Respiratory: Negative for shortness of breath.   Cardiovascular: Negative for chest pain, palpitations, orthopnea and leg swelling.  Gastrointestinal: Negative for abdominal pain.  Skin: Negative for rash.  Neurological: Negative for dizziness, sensory change, loss of consciousness, weakness and headaches.  Endo/Heme/Allergies: Negative for polydipsia. Does not bruise/bleed easily.  Psychiatric/Behavioral: Negative for memory loss. The patient does not have insomnia.   All other systems reviewed and are negative.    Observations/Objective: Alert and oriented- answers all questions appropriately moderate distress Tearful during exam   Assessment and Plan: Stacie Solis in today with chief complaint of Anxiety   1. Recurrent major depressive disorder, in full remission ([place]) Stress management Continue lexapro as rx Added wellbutrin- may take several weeks to see improvemnet - buPROPion (WELLBUTRIN XL) 150 MG 24 hr tablet; Take 1 tablet (150 mg total) by mouth daily.  Dispense: 30 tablet; Refill: 3  2. GAD (generalized anxiety disorder) Do not want o do xanax because patient is on [place]  If this does not help we will do psych referral - busPIRone (BUSPAR) 5 MG tablet; Take 1 tablet (5 mg total) by mouth 3 (three) times daily.  Dispense: 90 tablet; Refill: 2     Follow Up Instructions: 3 weeks    I discussed the assessment and treatment plan with the patient. The patient was provided an opportunity to ask questions and all were answered. The  patient agreed with the plan and demonstrated an understanding of the instructions.   The patient was advised to call back or seek an in-person evaluation if the symptoms worsen or if the condition fails to improve as anticipated.  The above assessment and management plan was discussed with the patient. The patient verbalized understanding of and has agreed to the management plan. Patient is aware to call the clinic if symptoms persist or worsen. Patient is aware when to return to the clinic for a follow-up visit. Patient educated on when it is appropriate to go to the emergency department.   Time call ended:  12:48  I provided 13 minutes of non-face-to-face time during this encounter.    [name], FNP

## 2019-04-14 ENCOUNTER — Ambulatory Visit: Payer: 59 | Admitting: Nurse Practitioner

## 2019-04-29 ENCOUNTER — Telehealth: Payer: Self-pay | Admitting: Nurse Practitioner

## 2019-04-29 NOTE — Telephone Encounter (Signed)
Please advise on note?  

## 2019-04-30 NOTE — Telephone Encounter (Signed)
Patient is aware that letter is ready for pickup. 

## 2019-05-07 ENCOUNTER — Ambulatory Visit (INDEPENDENT_AMBULATORY_CARE_PROVIDER_SITE_OTHER): Payer: 59 | Admitting: Family Medicine

## 2019-05-07 ENCOUNTER — Encounter: Payer: Self-pay | Admitting: Family Medicine

## 2019-05-07 DIAGNOSIS — R0981 Nasal congestion: Secondary | ICD-10-CM | POA: Diagnosis not present

## 2019-05-07 DIAGNOSIS — R519 Headache, unspecified: Secondary | ICD-10-CM | POA: Diagnosis not present

## 2019-05-07 DIAGNOSIS — J3489 Other specified disorders of nose and nasal sinuses: Secondary | ICD-10-CM | POA: Diagnosis not present

## 2019-05-07 DIAGNOSIS — R509 Fever, unspecified: Secondary | ICD-10-CM

## 2019-05-07 MED ORDER — PREDNISONE 20 MG PO TABS: ORAL_TABLET | ORAL | 0 refills | Status: DC

## 2019-05-07 MED ORDER — AMOXICILLIN-POT CLAVULANATE 875-125 MG PO TABS: 1.0000 | ORAL_TABLET | Freq: Two times a day (BID) | ORAL | 0 refills | Status: AC

## 2019-05-07 NOTE — Progress Notes (Signed)
Virtual Visit via telephone Note Due to COVID-19 pandemic this visit was conducted virtually. This visit type was conducted due to national recommendations for restrictions regarding the COVID-19 Pandemic (e.g. social distancing, sheltering in place) in an effort to limit this patient's exposure and mitigate transmission in our community. All issues noted in this document were discussed and addressed.  A physical exam was not performed with this format.   I connected with Stacie Solis on 05/07/2019 at 0835 by telephone and verified that I am speaking with the correct person using two identifiers. Stacie Solis is currently located at home and no one is currently with them during visit. The provider, Monia Pouch, FNP is located in their office at time of visit.  I discussed the limitations, risks, security and privacy concerns of performing an evaluation and management service by telephone and the availability of in person appointments. I also discussed with the patient that there may be a patient responsible charge related to this service. The patient expressed understanding and agreed to proceed.  Subjective:  Patient ID: Stacie Solis, female    DOB: August 14, 1960, 59 y.o.   MRN: [id number]  Chief Complaint:  Sinus Problem   HPI: Stacie Solis is a 59 y.o. female presenting on 05/07/2019 for Sinus Problem   Sinus Problem This is a new problem. The current episode started in the past 7 days. The problem has been rapidly worsening since onset. The maximum temperature recorded prior to her arrival was 101 - 101.9 F. The fever has been present for 3 to 4 days. Her pain is at a severity of 5/10. The pain is moderate. Associated symptoms include chills, congestion, coughing, headaches and sinus pressure. Pertinent negatives include no diaphoresis, ear pain, hoarse voice, neck pain, shortness of breath, sneezing, sore throat or swollen glands. Past treatments include oral decongestants, saline sprays,  spray decongestants and acetaminophen. The treatment provided no relief.     Relevant past medical, surgical, family, and social history reviewed and updated as indicated.  Allergies and medications reviewed and updated.   Past Medical History:  Diagnosis Date  . Allergy   . Anxiety   . Chronic back pain   . GERD (gastroesophageal reflux disease)   . Hyperlipidemia     Past Surgical History:  Procedure Laterality Date  . BRAIN SURGERY     trig neuralgia  . CHOLECYSTECTOMY      Social History   Socioeconomic History  . Marital status: Married    Spouse name: Not on file  . Number of children: 2  . Years of education: Not on file  . Highest education level: Not on file  Occupational History  . Occupation: N/A  Tobacco Use  . Smoking status: Current Every Day Smoker    Packs/day: 2.00    Types: Cigarettes  . Smokeless tobacco: Never Used  . Tobacco comment: Declined info  Substance and Sexual Activity  . Alcohol use: No    Alcohol/week: 0.0 standard drinks  . Drug use: No  . Sexual activity: Not on file  Other Topics Concern  . Not on file  Social History Narrative  . Not on file   Social Determinants of Health   Financial Resource Strain:   . Difficulty of Paying Living Expenses:   Food Insecurity:   . Worried About Charity fundraiser in the Last Year:   . [place] in the Last Year:   Transportation Needs:   . Lack of  Transportation (Medical):   Marland Kitchen Lack of Transportation (Non-Medical):   Physical Activity:   . Days of Exercise per Week:   . Minutes of Exercise per Session:   Stress:   . Feeling of Stress :   Social Connections:   . Frequency of Communication with Friends and Family:   . Frequency of Social Gatherings with Friends and Family:   . Attends Religious Services:   . Active Member of Clubs or Organizations:   . Attends Archivist Meetings:   Marland Kitchen Marital Status:   Intimate Partner Violence:   . Fear of Current or  Ex-Partner:   . Emotionally Abused:   Marland Kitchen Physically Abused:   . Sexually Abused:     Outpatient Encounter Medications as of 05/07/2019  Medication Sig  . albuterol (PROVENTIL HFA;VENTOLIN HFA) 108 (90 Base) MCG/ACT inhaler USE 2 PUFFS EVERY 4 HOURS AS NEEDED  . amoxicillin-clavulanate (AUGMENTIN) 875-125 MG tablet Take 1 tablet by mouth 2 (two) times daily for 10 days.  Marland Kitchen atorvastatin (LIPITOR) 40 MG tablet TAKE 1 TABLET BY MOUTH EVERY DAY (Needs to be seen before next refill)  . buPROPion (WELLBUTRIN XL) 150 MG 24 hr tablet Take 1 tablet (150 mg total) by mouth daily.  . busPIRone (BUSPAR) 5 MG tablet Take 1 tablet (5 mg total) by mouth 3 (three) times daily.  . cholecalciferol (VITAMIN D) 1000 UNITS tablet Take 1,000 Units by mouth daily.  . Choline Fenofibrate (FENOFIBRIC ACID) 135 MG CPDR Take 1 tablet by mouth daily.  . cyclobenzaprine (FLEXERIL) 10 MG tablet TAKE 1 TABLET BY MOUTH THREE TIMES A DAY AS NEEDED FOR MUSCLE SPASMS  . escitalopram (LEXAPRO) 20 MG tablet Take 1 tablet (20 mg total) by mouth daily.  . fish oil-omega-3 fatty acids 1000 MG capsule Take 2 g by mouth 2 (two) times daily.  . furosemide (LASIX) 40 MG tablet Take 1 tablet (40 mg total) by mouth daily.  Marland Kitchen omeprazole (PRILOSEC) 40 MG capsule Take 1 capsule (40 mg total) by mouth daily.  . pramipexole (MIRAPEX) 0.5 MG tablet Take 1 tablet (0.5 mg total) by mouth 3 (three) times daily.  . predniSONE (DELTASONE) 20 MG tablet 2 po at sametime daily for 5 days  . triamcinolone (NASACORT) 55 MCG/ACT AERO nasal inhaler Place 2 sprays into the nose daily.  Marland Kitchen zolpidem (AMBIEN) 10 MG tablet Take 1 tablet (10 mg total) by mouth at bedtime as needed for sleep.  . [DISCONTINUED] amoxicillin-clavulanate (AUGMENTIN) 875-125 MG tablet Take 1 tablet by mouth 2 (two) times daily.   No facility-administered encounter medications on file as of 05/07/2019.    No Known Allergies  Review of Systems  Constitutional: Positive for activity  change, chills, fatigue and fever. Negative for appetite change, diaphoresis and unexpected weight change.  HENT: Positive for congestion, postnasal drip, rhinorrhea, sinus pressure and sinus pain. Negative for ear pain, hoarse voice, sneezing, sore throat, tinnitus, trouble swallowing and voice change.   Eyes: Negative.  Negative for photophobia and visual disturbance.  Respiratory: Positive for cough. Negative for chest tightness, shortness of breath and wheezing.   Cardiovascular: Negative for chest pain, palpitations and leg swelling.  Gastrointestinal: Negative for abdominal pain, blood in stool, constipation, diarrhea, nausea and vomiting.  Endocrine: Negative.   Genitourinary: Negative for decreased urine volume, difficulty urinating, dysuria, frequency and urgency.  Musculoskeletal: Negative for arthralgias, myalgias and neck pain.  Skin: Negative.   Allergic/Immunologic: Negative.   Neurological: Positive for headaches. Negative for dizziness, tremors, seizures, syncope, facial  asymmetry, speech difficulty, weakness, light-headedness and numbness.  Hematological: Negative.   Psychiatric/Behavioral: Negative for confusion, hallucinations, sleep disturbance and suicidal ideas.  All other systems reviewed and are negative.        Observations/Objective: No vital signs or physical exam, this was a telephone or virtual health encounter.  Pt alert and oriented, answers all questions appropriately, and able to speak in full sentences.    Assessment and Plan: Deby was seen today for sinus problem.  Diagnoses and all orders for this visit:  Sinus pressure Fever and chills Sinus headache Congestion of nasal sinus Reported symptoms consistent with acute bacterial sinusitis. Has failed symptomatic care at home. Will initiate below. Pt aware to continue symptomatic care at home along with below. Report any new, worsening, or persistent symptoms.  -     amoxicillin-clavulanate  (AUGMENTIN) 875-125 MG tablet; Take 1 tablet by mouth 2 (two) times daily for 10 days. -     predniSONE (DELTASONE) 20 MG tablet; 2 po at sametime daily for 5 days     Follow Up Instructions: Return if symptoms worsen or fail to improve.    I discussed the assessment and treatment plan with the patient. The patient was provided an opportunity to ask questions and all were answered. The patient agreed with the plan and demonstrated an understanding of the instructions.   The patient was advised to call back or seek an in-person evaluation if the symptoms worsen or if the condition fails to improve as anticipated.  The above assessment and management plan was discussed with the patient. The patient verbalized understanding of and has agreed to the management plan. Patient is aware to call the clinic if they develop any new symptoms or if symptoms persist or worsen. Patient is aware when to return to the clinic for a follow-up visit. Patient educated on when it is appropriate to go to the emergency department.    I provided 15 minutes of non-face-to-face time during this encounter. The call started at 0835. The call ended at 13. The other time was used for coordination of care.    Monia Pouch, FNP-C [facility] [address] [place], [place] [address] [phone] 05/07/2019

## 2019-05-19 ENCOUNTER — Ambulatory Visit: Payer: Self-pay | Admitting: Nurse Practitioner

## 2019-05-25 ENCOUNTER — Other Ambulatory Visit: Payer: Self-pay

## 2019-05-25 ENCOUNTER — Encounter: Payer: Self-pay | Admitting: Nurse Practitioner

## 2019-05-25 ENCOUNTER — Ambulatory Visit (INDEPENDENT_AMBULATORY_CARE_PROVIDER_SITE_OTHER): Payer: 59 | Admitting: Nurse Practitioner

## 2019-05-25 DIAGNOSIS — N3000 Acute cystitis without hematuria: Secondary | ICD-10-CM | POA: Diagnosis not present

## 2019-05-25 MED ORDER — FLUCONAZOLE 150 MG PO TABS: 150.0000 mg | ORAL_TABLET | Freq: Once | ORAL | 0 refills | Status: AC

## 2019-05-25 MED ORDER — CEPHALEXIN 500 MG PO CAPS: 500.0000 mg | ORAL_CAPSULE | Freq: Two times a day (BID) | ORAL | 0 refills | Status: DC

## 2019-05-25 NOTE — Progress Notes (Signed)
Virtual Visit via telephone Note Due to COVID-19 pandemic this visit was conducted virtually. This visit type was conducted due to national recommendations for restrictions regarding the COVID-19 Pandemic (e.g. social distancing, sheltering in place) in an effort to limit this patient's exposure and mitigate transmission in our community. All issues noted in this document were discussed and addressed.  A physical exam was not performed with this format.  I connected with Stacie Solis on 05/25/19 at 9:25 by telephone and verified that I am speaking with the correct person using two identifiers. Stacie Solis is currently located at home and no one  is currently with her during visit. The provider, [name], FNP is located in their office at time of visit.  I discussed the limitations, risks, security and privacy concerns of performing an evaluation and management service by telephone and the availability of in person appointments. I also discussed with the patient that there may be a patient responsible charge related to this service. The patient expressed understanding and agreed to proceed.   History and Present Illness:   Chief Complaint: Urinary Tract Infection   HPI Patient calls in c/o surpapubc pain. Started 2 days ago. Sh ehas frequent urination with scant amounts of urine. AZO has helped with symptoms.   Review of Systems  Constitutional: Negative.   HENT: Negative.   Cardiovascular: Negative.   Genitourinary: Negative for dysuria, frequency and urgency.  Skin: Negative.   Neurological: Negative.   Psychiatric/Behavioral: Negative.   All other systems reviewed and are negative.    Observations/Objective:  Alert and oriented- answers all questions appropriately No distress  Assessment and Plan: Stacie Solis in today with chief complaint of Urinary Tract Infection   1. Acute cystitis without hematuria Take medication as prescribe Cotton underwear Take  shower not bath Cranberry juice, yogurt Force fluids AZO over the counter X2 days RTO prn   Meds ordered this encounter  Medications  . cephALEXin (KEFLEX) 500 MG capsule    Sig: Take 1 capsule (500 mg total) by mouth 2 (two) times daily.    Dispense:  14 capsule    Refill:  0    Order Specific Question:   Supervising Provider    Answer:   Caryl Pina A [id number]  . fluconazole (DIFLUCAN) 150 MG tablet    Sig: Take 1 tablet (150 mg total) by mouth once for 1 dose.    Dispense:  1 tablet    Refill:  0    Order Specific Question:   Supervising Provider    Answer:   Caryl Pina A [id number]          Follow Up Instructions: prn    I discussed the assessment and treatment plan with the patient. The patient was provided an opportunity to ask questions and all were answered. The patient agreed with the plan and demonstrated an understanding of the instructions.   The patient was advised to call back or seek an in-person evaluation if the symptoms worsen or if the condition fails to improve as anticipated.  The above assessment and management plan was discussed with the patient. The patient verbalized understanding of and has agreed to the management plan. Patient is aware to call the clinic if symptoms persist or worsen. Patient is aware when to return to the clinic for a follow-up visit. Patient educated on when it is appropriate to go to the emergency department.   Time call ended:  9:38 I provided 12 minutes of non-face-to-face  time during this encounter.    [name], FNP

## 2019-05-28 ENCOUNTER — Other Ambulatory Visit: Payer: Self-pay

## 2019-05-28 ENCOUNTER — Encounter: Payer: Self-pay | Admitting: Nurse Practitioner

## 2019-05-28 ENCOUNTER — Ambulatory Visit (INDEPENDENT_AMBULATORY_CARE_PROVIDER_SITE_OTHER): Payer: No Typology Code available for payment source | Admitting: Nurse Practitioner

## 2019-05-28 VITALS — BP 126/81 | HR 83 | Temp 97.9°F | Resp 20 | Ht 62.0 in | Wt 190.0 lb

## 2019-05-28 DIAGNOSIS — M545 Low back pain, unspecified: Secondary | ICD-10-CM

## 2019-05-28 DIAGNOSIS — F411 Generalized anxiety disorder: Secondary | ICD-10-CM

## 2019-05-28 DIAGNOSIS — K219 Gastro-esophageal reflux disease without esophagitis: Secondary | ICD-10-CM | POA: Diagnosis not present

## 2019-05-28 DIAGNOSIS — F410 Panic disorder [episodic paroxysmal anxiety] without agoraphobia: Secondary | ICD-10-CM

## 2019-05-28 DIAGNOSIS — E782 Mixed hyperlipidemia: Secondary | ICD-10-CM

## 2019-05-28 DIAGNOSIS — G2581 Restless legs syndrome: Secondary | ICD-10-CM

## 2019-05-28 DIAGNOSIS — Z6835 Body mass index (BMI) 35.0-35.9, adult: Secondary | ICD-10-CM

## 2019-05-28 DIAGNOSIS — F3342 Major depressive disorder, recurrent, in full remission: Secondary | ICD-10-CM

## 2019-05-28 DIAGNOSIS — R600 Localized edema: Secondary | ICD-10-CM

## 2019-05-28 DIAGNOSIS — G8929 Other chronic pain: Secondary | ICD-10-CM

## 2019-05-28 DIAGNOSIS — R609 Edema, unspecified: Secondary | ICD-10-CM

## 2019-05-28 DIAGNOSIS — F5101 Primary insomnia: Secondary | ICD-10-CM

## 2019-05-28 DIAGNOSIS — F17200 Nicotine dependence, unspecified, uncomplicated: Secondary | ICD-10-CM

## 2019-05-28 MED ORDER — ZOLPIDEM TARTRATE 10 MG PO TABS: 10.0000 mg | ORAL_TABLET | Freq: Every evening | ORAL | 2 refills | Status: DC | PRN

## 2019-05-28 MED ORDER — ATORVASTATIN CALCIUM 40 MG PO TABS: ORAL_TABLET | ORAL | 1 refills | Status: DC

## 2019-05-28 MED ORDER — ESCITALOPRAM OXALATE 20 MG PO TABS: 20.0000 mg | ORAL_TABLET | Freq: Every day | ORAL | 1 refills | Status: DC

## 2019-05-28 MED ORDER — FUROSEMIDE 40 MG PO TABS: 40.0000 mg | ORAL_TABLET | Freq: Every day | ORAL | 1 refills | Status: DC

## 2019-05-28 MED ORDER — OMEPRAZOLE 40 MG PO CPDR: 40.0000 mg | DELAYED_RELEASE_CAPSULE | Freq: Every day | ORAL | 1 refills | Status: DC

## 2019-05-28 MED ORDER — FENOFIBRIC ACID 135 MG PO CPDR: 1.0000 | DELAYED_RELEASE_CAPSULE | Freq: Every day | ORAL | 1 refills | Status: DC

## 2019-05-28 MED ORDER — BUSPIRONE HCL 5 MG PO TABS: 5.0000 mg | ORAL_TABLET | Freq: Three times a day (TID) | ORAL | 2 refills | Status: DC

## 2019-05-28 MED ORDER — PRAMIPEXOLE DIHYDROCHLORIDE 0.5 MG PO TABS: 0.5000 mg | ORAL_TABLET | Freq: Three times a day (TID) | ORAL | 1 refills | Status: DC

## 2019-05-28 MED ORDER — BUPROPION HCL ER (XL) 150 MG PO TB24: 150.0000 mg | ORAL_TABLET | Freq: Every day | ORAL | 1 refills | Status: DC

## 2019-05-28 MED ORDER — CYCLOBENZAPRINE HCL 10 MG PO TABS: 10.0000 mg | ORAL_TABLET | Freq: Every day | ORAL | 1 refills | Status: DC

## 2019-05-28 NOTE — Progress Notes (Signed)
Subjective:    Patient ID: Stacie Solis, female    DOB: [birth year], 59 y.o.   MRN: [id number]   Chief Complaint: Medical Management of Chronic Issues    HPI:  1. Mixed hyperlipidemia Does not watch diet and does very little exercise. Lab Results  Component Value Date   CHOL 107 11/18/2018   HDL 33 (L) 11/18/2018   LDLCALC 56 11/18/2018   TRIG 95 11/18/2018   CHOLHDL 3.2 11/18/2018     2. Gastroesophageal reflux disease without esophagitis Patient is on omeprazole dialy and works well to keep symptoms under control.  3. RLS (restless legs syndrome) Is on mirapex nightly and seems to work wells to keep legs from moving all night.  4. Chronic midline low back pain without sciatica Is on flexeril as needed. She sees pain clinic  5. Recurrent major depressive disorder, in full remission ([place]) Is on lexapro and wellbutrin dail and is doing well on combination. Depression screen [facility] 2/9 05/28/2019 04/13/2019 11/18/2018  Decreased Interest 0 1 3  Down, Depressed, Hopeless 0 2 3  PHQ - 2 Score 0 3 6  Altered sleeping - 2 3  Tired, decreased energy - 3 3  Change in appetite - 2 1  Feeling bad or failure about yourself  - 1 0  Trouble concentrating - 2 1  Moving slowly or fidgety/restless - - 0  Suicidal thoughts - 0 0  PHQ-9 Score - 13 14  Difficult doing work/chores - Very difficult Somewhat difficult     6. Panic attacks Had panic attakc earlier today. Occurs a couple of times a week. Uses buspar and that works well for her.  7. Primary insomnia On ambien to sleep. Does not take every night.  8. Smoker Smokes over a pack a day  9. BMI 35.0-35.9,adult Weight is up 5lbs from last vsist  Wt Readings from Last 3 Encounters:  05/28/19 190 lb (86.2 kg)  11/18/18 185 lb (83.9 kg)  09/30/18 193 lb (87.5 kg)   BMI Readings from Last 3 Encounters:  05/28/19 34.75 kg/m  11/18/18 33.84 kg/m  09/30/18 35.30 kg/m   10. Peripheral edema Has daily. Takes lasix and  will usually go down some at night.   Outpatient Encounter Medications as of 05/28/2019  Medication Sig  . albuterol (PROVENTIL HFA;VENTOLIN HFA) 108 (90 Base) MCG/ACT inhaler USE 2 PUFFS EVERY 4 HOURS AS NEEDED  . atorvastatin (LIPITOR) 40 MG tablet TAKE 1 TABLET BY MOUTH EVERY DAY (Needs to be seen before next refill)  . buPROPion (WELLBUTRIN XL) 150 MG 24 hr tablet Take 1 tablet (150 mg total) by mouth daily.  . busPIRone (BUSPAR) 5 MG tablet Take 1 tablet (5 mg total) by mouth 3 (three) times daily.  . cephALEXin (KEFLEX) 500 MG capsule Take 1 capsule (500 mg total) by mouth 2 (two) times daily.  . cholecalciferol (VITAMIN D) 1000 UNITS tablet Take 1,000 Units by mouth daily.  . Choline Fenofibrate (FENOFIBRIC ACID) 135 MG CPDR Take 1 tablet by mouth daily.  . cyclobenzaprine (FLEXERIL) 10 MG tablet TAKE 1 TABLET BY MOUTH THREE TIMES A DAY AS NEEDED FOR MUSCLE SPASMS  . escitalopram (LEXAPRO) 20 MG tablet Take 1 tablet (20 mg total) by mouth daily.  . fish oil-omega-3 fatty acids 1000 MG capsule Take 2 g by mouth 2 (two) times daily.  . furosemide (LASIX) 40 MG tablet Take 1 tablet (40 mg total) by mouth daily.  Marland Kitchen omeprazole (PRILOSEC) 40 MG capsule Take 1 capsule (  40 mg total) by mouth daily.  . pramipexole (MIRAPEX) 0.5 MG tablet Take 1 tablet (0.5 mg total) by mouth 3 (three) times daily.  Marland Kitchen triamcinolon e (NASACORT) 55 MCG/ACT AERO nasal inhaler Place 2 sprays into the nose daily.  Marland Kitchen zolpidem (AMBIEN) 10 MG tablet Take 1 tablet (10 mg total) by mouth at bedtime as needed for sleep.     Past Surgical History:  Procedure Laterality Date  . BRAIN SURGERY     trig neuralgia  . CHOLECYSTECTOMY      Family History  Problem Relation Age of Onset  . Hypertension Mother   . Diabetes Mother   . Arthritis Mother   . Lung cancer Maternal Grandfather     New complaints: None today  Social history: Lives with her husband  Controlled substance contract: 12/17/18    Review of  Systems  Constitutional: Negative for diaphoresis.  Eyes: Negative for pain.  Respiratory: Negative for shortness of breath.   Cardiovascular: Negative for chest pain, palpitations and leg swelling.  Gastrointestinal: Negative for abdominal pain.  Endocrine: Negative for polydipsia.  Skin: Negative for rash.  Neurological: Negative for dizziness, weakness and headaches.  Hematological: Does not bruise/bleed easily.  All other systems reviewed and are negative.      Objective:   Physical Exam Vitals and nursing note reviewed.  Constitutional:      General: She is not in acute distress.    Appearance: Normal appearance. She is well-developed.  HENT:     Head: Normocephalic.     Nose: Nose normal.  Eyes:     Pupils: Pupils are equal, round, and reactive to light.  Neck:     Vascular: No carotid bruit or JVD.  Cardiovascular:     Rate and Rhythm: Normal rate and regular rhythm.     Heart sounds: Normal heart sounds.  Pulmonary:     Effort: Pulmonary effort is normal. No respiratory distress.     Breath sounds: Normal breath sounds. No wheezing or rales.  Chest:     Chest wall: No tenderness.  Abdominal:     General: Bowel sounds are normal. There is no distension or abdominal bruit.     Palpations: Abdomen is soft. There is no hepatomegaly, splenomegaly, mass or pulsatile mass.     Tenderness: There is no abdominal tenderness.  Musculoskeletal:        General: Normal range of motion.     Cervical back: Normal range of motion and neck supple.     Right lower leg: Edema (2+) present.     Left lower leg: Edema (2+) present.  Lymphadenopathy:     Cervical: No cervical adenopathy.  Skin:    General: Skin is warm and dry.  Neurological:     Mental Status: She is alert and oriented to person, place, and time.     Deep Tendon Reflexes: Reflexes are normal and symmetric.  Psychiatric:        Behavior: Behavior normal.        Thought Content: Thought content normal.         Judgment: Judgment normal.     BP 126/81   Pulse 83   Temp 97.9 F (36.6 C) (Temporal)   Resp 20   Ht 5\' 2"  (1.575 m)   Wt 190 lb (86.2 kg)   SpO2 98%   BMI 34.75 kg/m        Assessment & Plan:  TAMETRA KOURY comes in today with chief complaint of Medical Management  of Chronic Issues   Diagnosis and orders addressed:  1. Mixed hyperlipidemia Low fat diet - Choline Fenofibrate (FENOFIBRIC ACID) 135 MG CPDR; Take 1 tablet by mouth daily.  Dispense: 90 capsule; Refill: 1 - atorvastatin (LIPITOR) 40 MG tablet; TAKE 1 TABLET BY MOUTH EVERY DAY (Needs to be seen before next refill)  Dispense: 90 tablet; Refill: 1  2. Gastroesophageal reflux disease without esophagitis Avoid spicy foods Do not eat 2 hours prior to bedtime - omeprazole (PRILOSEC) 40 MG capsule; Take 1 capsule (40 mg total) by mouth daily.  Dispense: 90 capsule; Refill: 1  3. RLS (restless legs syndrome) Keep legs warm at night - pramipexole (MIRAPEX) 0.5 MG tablet; Take 1 tablet (0.5 mg total) by mouth 3 (three) times daily.  Dispense: 270 tablet; Refill: 1  4. Chronic midline low back pain without sciatica Continue woth pain management - cyclobenzaprine (FLEXERIL) 10 MG tablet; Take 1 tablet (10 mg total) by mouth at bedtime.  Dispense: 90 tablet; Refill: 1  5. Recurrent major depressive disorder, in full remission ([place]) Stress management - buPROPion (WELLBUTRIN XL) 150 MG 24 hr tablet; Take 1 tablet (150 mg total) by mouth daily.  Dispense: 90 tablet; Refill: 1 - escitalopram (LEXAPRO) 20 MG tablet; Take 1 tablet (20 mg total) by mouth daily.  Dispense: 90 tablet; Refill: 1  6. Panic attacks  7. Primary insomnia Bedtime routine - zolpidem (AMBIEN) 10 MG tablet; Take 1 tablet (10 mg total) by mouth at bedtime as needed for sleep.  Dispense: 30 tablet; Refill: 2  8. Smoker Smoking cessation encouraged  9. BMI 35.0-35.9,adult Discussed diet and exercise for person with BMI >25 Will recheck weight in  3-6 months  10. GAD (generalized anxiety disorder) - busPIRone (BUSPAR) 5 MG tablet; Take 1 tablet (5 mg total) by mouth 3 (three) times daily.  Dispense: 90 tablet; Refill: 2  11. Peripheral edema Wear compression socke Elevate legs when sitting - furosemide (LASIX) 40 MG tablet; Take 1 tablet (40 mg total) by mouth daily.  Dispense: 90 tablet; Refill: 1   Labs pending Health Maintenance reviewed Diet and exercise encouraged  Follow up plan: 6 months   [name], FNP

## 2019-05-28 NOTE — Patient Instructions (Signed)
Peripheral Edema  Peripheral edema is swelling that is caused by a buildup of fluid. Peripheral edema most often affects the lower legs, ankles, and feet. It can also develop in the arms, hands, and face. The area of the body that has peripheral edema will look swollen. It may also feel heavy or warm. Your clothes may start to feel tight. Pressing on the area may make a temporary dent in your skin. You may not be able to move your swollen arm or leg as much as usual. There are many causes of peripheral edema. It can happen because of a complication of other conditions such as congestive heart failure, kidney disease, or a problem with your blood circulation. It also can be a side effect of certain medicines or because of an infection. It often happens to women during pregnancy. Sometimes, the cause is not known. Follow these instructions at home: Managing pain, stiffness, and swelling   Raise (elevate) your legs while you are sitting or lying down.  Move around often to prevent stiffness and to lessen swelling.  Do not sit or stand for long periods of time.  Wear support stockings as told by your health care provider. Medicines  Take over-the-counter and prescription medicines only as told by your health care provider.  Your health care provider may prescribe medicine to help your body get rid of excess water (diuretic). General instructions  Pay attention to any changes in your symptoms.  Follow instructions from your health care provider about limiting salt (sodium) in your diet. Sometimes, eating less salt may reduce swelling.  Moisturize skin daily to help prevent skin from cracking and draining.  Keep all follow-up visits as told by your health care provider. This is important. Contact a health care provider if you have:  A fever.  Edema that starts suddenly or is getting worse, especially if you are pregnant or have a medical condition.  Swelling in only one leg.  Increased  swelling, redness, or pain in one or both of your legs.  Drainage or sores at the area where you have edema. Get help right away if you:  Develop shortness of breath, especially when you are lying down.  Have pain in your chest or abdomen.  Feel weak.  Feel faint. Summary  Peripheral edema is swelling that is caused by a buildup of fluid. Peripheral edema most often affects the lower legs, ankles, and feet.  Move around often to prevent stiffness and to lessen swelling. Do not sit or stand for long periods of time.  Pay attention to any changes in your symptoms.  Contact a health care provider if you have edema that starts suddenly or is getting worse, especially if you are pregnant or have a medical condition.  Get help right away if you develop shortness of breath, especially when lying down. This information is not intended to replace advice given to you by your health care provider. Make sure you discuss any questions you have with your health care provider. Document Revised: 11/06/2017 Document Reviewed: 11/06/2017 Elsevier Patient Education  2020 [employer]  

## 2019-05-28 NOTE — Addendum Note (Signed)
Addended by: Chevis Pretty on: 05/28/2019 04:41 PM   Modules accepted: Orders

## 2019-05-29 LAB — CMP14+EGFR
ALT: 26 IU/L (ref 0–32)
AST: 26 IU/L (ref 0–40)
Albumin/Globulin Ratio: 1.3 (ref 1.2–2.2)
Albumin: 3.6 g/dL — ABNORMAL LOW (ref 3.8–4.9)
Alkaline Phosphatase: 101 IU/L (ref 39–117)
BUN/Creatinine Ratio: 11 — ABNORMAL LOW (ref 12–28)
BUN: 15 mg/dL (ref 8–27)
Bilirubin Total: 0.4 mg/dL (ref 0.0–1.2)
CO2: 26 mmol/L (ref 20–29)
Calcium: 8.9 mg/dL (ref 8.7–10.3)
Chloride: 103 mmol/L (ref 96–106)
Creatinine, Ser: 1.34 mg/dL — ABNORMAL HIGH (ref 0.57–1.00)
GFR calc Af Amer: 50 mL/min/{1.73_m2} — ABNORMAL LOW (ref >59)
GFR calc non Af Amer: 43 mL/min/{1.73_m2} — ABNORMAL LOW (ref >59)
Globulin, Total: 2.8 g/dL (ref 1.5–4.5)
Glucose: 87 mg/dL (ref 65–99)
Potassium: 3.9 mmol/L (ref 3.5–5.2)
Sodium: 143 mmol/L (ref 134–144)
Total Protein: 6.4 g/dL (ref 6.0–8.5)

## 2019-05-29 LAB — LIPID PANEL
Chol/HDL Ratio: 4.6 ratio — ABNORMAL HIGH (ref 0.0–4.4)
Cholesterol, Total: 88 mg/dL — ABNORMAL LOW (ref 100–199)
HDL: 19 mg/dL — ABNORMAL LOW (ref >39)
LDL Chol Calc (NIH): 46 mg/dL (ref 0–99)
Triglycerides: 126 mg/dL (ref 0–149)
VLDL Cholesterol Cal: 23 mg/dL (ref 5–40)

## 2019-05-29 LAB — CBC WITH DIFFERENTIAL/PLATELET
Basophils Absolute: 0.2 10*3/uL (ref 0.0–0.2)
Basos: 1 %
EOS (ABSOLUTE): 0.2 10*3/uL (ref 0.0–0.4)
Eos: 2 %
Hematocrit: 38.4 % (ref 34.0–46.6)
Hemoglobin: 13.2 g/dL (ref 11.1–15.9)
Immature Grans (Abs): 0.3 10*3/uL — ABNORMAL HIGH (ref 0.0–0.1)
Immature Granulocytes: 3 %
Lymphocytes Absolute: 2.7 10*3/uL (ref 0.7–3.1)
Lymphs: 26 %
MCH: 30.7 pg (ref 26.6–33.0)
MCHC: 34.4 g/dL (ref 31.5–35.7)
MCV: 89 fL (ref 79–97)
Monocytes Absolute: 1.2 10*3/uL — ABNORMAL HIGH (ref 0.1–0.9)
Monocytes: 12 %
Neutrophils Absolute: 5.8 10*3/uL (ref 1.4–7.0)
Neutrophils: 56 %
Platelets: 314 10*3/uL (ref 150–450)
RBC: 4.3 x10E6/uL (ref 3.77–5.28)
RDW: 12.9 % (ref 11.7–15.4)
WBC: 10.5 10*3/uL (ref 3.4–10.8)

## 2019-06-07 ENCOUNTER — Other Ambulatory Visit: Payer: Self-pay | Admitting: Nurse Practitioner

## 2019-06-07 DIAGNOSIS — G8929 Other chronic pain: Secondary | ICD-10-CM

## 2019-07-06 ENCOUNTER — Other Ambulatory Visit: Payer: Self-pay | Admitting: Physical Medicine and Rehabilitation

## 2019-07-06 DIAGNOSIS — E278 Other specified disorders of adrenal gland: Secondary | ICD-10-CM

## 2019-07-24 ENCOUNTER — Other Ambulatory Visit: Payer: 59

## 2019-07-31 ENCOUNTER — Telehealth: Payer: Self-pay | Admitting: Nurse Practitioner

## 2019-07-31 ENCOUNTER — Other Ambulatory Visit: Payer: Self-pay | Admitting: Physical Medicine and Rehabilitation

## 2019-07-31 ENCOUNTER — Ambulatory Visit
Admission: RE | Admit: 2019-07-31 | Discharge: 2019-07-31 | Disposition: A | Discharge status: Home / Self Care | Payer: BC Managed Care – PPO | Source: Ambulatory Visit | Attending: Physical Medicine and Rehabilitation | Admitting: Physical Medicine and Rehabilitation

## 2019-07-31 DIAGNOSIS — E278 Other specified disorders of adrenal gland: Secondary | ICD-10-CM

## 2019-07-31 DIAGNOSIS — D3501 Benign neoplasm of right adrenal gland: Secondary | ICD-10-CM | POA: Diagnosis not present

## 2019-07-31 NOTE — Telephone Encounter (Signed)
Pt states she went to get a CT scan done today and they could not give her the contrast because her kidney function was 148 and she would like to speak to a nurse in regards.

## 2019-07-31 NOTE — Telephone Encounter (Signed)
Left message to please call our office and reviewed last creatinine numbers from last CMP test here. Kidney function not filtering as well.

## 2019-08-07 ENCOUNTER — Other Ambulatory Visit: Payer: Self-pay | Admitting: Nurse Practitioner

## 2019-08-07 DIAGNOSIS — G8929 Other chronic pain: Secondary | ICD-10-CM

## 2019-08-13 DIAGNOSIS — M5136 Other intervertebral disc degeneration, lumbar region: Secondary | ICD-10-CM | POA: Diagnosis not present

## 2019-11-21 ENCOUNTER — Other Ambulatory Visit: Payer: Self-pay | Admitting: Nurse Practitioner

## 2019-11-21 DIAGNOSIS — M545 Low back pain, unspecified: Secondary | ICD-10-CM

## 2019-12-07 ENCOUNTER — Other Ambulatory Visit: Payer: Self-pay | Admitting: Nurse Practitioner

## 2019-12-07 DIAGNOSIS — K219 Gastro-esophageal reflux disease without esophagitis: Secondary | ICD-10-CM

## 2019-12-22 ENCOUNTER — Other Ambulatory Visit: Payer: Self-pay | Admitting: Nurse Practitioner

## 2019-12-22 DIAGNOSIS — E782 Mixed hyperlipidemia: Secondary | ICD-10-CM

## 2019-12-27 ENCOUNTER — Other Ambulatory Visit: Payer: Self-pay | Admitting: Nurse Practitioner

## 2019-12-27 DIAGNOSIS — F3342 Major depressive disorder, recurrent, in full remission: Secondary | ICD-10-CM

## 2020-01-04 ENCOUNTER — Other Ambulatory Visit: Payer: Self-pay | Admitting: Nurse Practitioner

## 2020-01-04 DIAGNOSIS — K219 Gastro-esophageal reflux disease without esophagitis: Secondary | ICD-10-CM

## 2020-01-14 ENCOUNTER — Telehealth: Payer: Self-pay | Admitting: Nurse Practitioner

## 2020-01-14 NOTE — Telephone Encounter (Signed)
Pt is having back issues, had to have a CT scan and said they could not use dye because of her kidney. Would like to know if she can have labs done to check kidney function. Pt stated that she was also losing weight a lot recently. Has appt 12/20 for med refill.

## 2020-01-14 NOTE — Telephone Encounter (Signed)
Patient aware.

## 2020-01-14 NOTE — Telephone Encounter (Signed)
Labs can wait until come sin for appointmnet

## 2020-01-14 NOTE — Telephone Encounter (Signed)
Please advise if patient can get kidneys checked due to CT or does she need to wait until appointment?

## 2020-01-19 ENCOUNTER — Ambulatory Visit: Payer: No Typology Code available for payment source | Admitting: Nurse Practitioner

## 2020-01-21 ENCOUNTER — Other Ambulatory Visit: Payer: Self-pay | Admitting: Nurse Practitioner

## 2020-01-21 DIAGNOSIS — E782 Mixed hyperlipidemia: Secondary | ICD-10-CM

## 2020-01-21 DIAGNOSIS — F3342 Major depressive disorder, recurrent, in full remission: Secondary | ICD-10-CM

## 2020-02-01 ENCOUNTER — Other Ambulatory Visit: Payer: Self-pay | Admitting: Nurse Practitioner

## 2020-02-01 DIAGNOSIS — R609 Edema, unspecified: Secondary | ICD-10-CM

## 2020-02-01 DIAGNOSIS — E782 Mixed hyperlipidemia: Secondary | ICD-10-CM

## 2020-02-01 DIAGNOSIS — K219 Gastro-esophageal reflux disease without esophagitis: Secondary | ICD-10-CM

## 2020-02-01 DIAGNOSIS — F3342 Major depressive disorder, recurrent, in full remission: Secondary | ICD-10-CM

## 2020-02-15 ENCOUNTER — Ambulatory Visit: Payer: No Typology Code available for payment source | Admitting: Nurse Practitioner

## 2020-02-15 ENCOUNTER — Encounter: Payer: Self-pay | Admitting: Nurse Practitioner

## 2020-02-15 ENCOUNTER — Ambulatory Visit (INDEPENDENT_AMBULATORY_CARE_PROVIDER_SITE_OTHER): Payer: 59

## 2020-02-15 ENCOUNTER — Other Ambulatory Visit: Payer: Self-pay

## 2020-02-15 VITALS — BP 144/93 | HR 84 | Temp 96.8°F | Resp 20 | Ht 62.0 in | Wt 172.0 lb

## 2020-02-15 DIAGNOSIS — F410 Panic disorder [episodic paroxysmal anxiety] without agoraphobia: Secondary | ICD-10-CM

## 2020-02-15 DIAGNOSIS — K219 Gastro-esophageal reflux disease without esophagitis: Secondary | ICD-10-CM

## 2020-02-15 DIAGNOSIS — E782 Mixed hyperlipidemia: Secondary | ICD-10-CM

## 2020-02-15 DIAGNOSIS — F411 Generalized anxiety disorder: Secondary | ICD-10-CM

## 2020-02-15 DIAGNOSIS — G2581 Restless legs syndrome: Secondary | ICD-10-CM

## 2020-02-15 DIAGNOSIS — F17200 Nicotine dependence, unspecified, uncomplicated: Secondary | ICD-10-CM

## 2020-02-15 DIAGNOSIS — F5101 Primary insomnia: Secondary | ICD-10-CM | POA: Diagnosis not present

## 2020-02-15 DIAGNOSIS — Z6835 Body mass index (BMI) 35.0-35.9, adult: Secondary | ICD-10-CM

## 2020-02-15 DIAGNOSIS — R609 Edema, unspecified: Secondary | ICD-10-CM

## 2020-02-15 DIAGNOSIS — F3342 Major depressive disorder, recurrent, in full remission: Secondary | ICD-10-CM

## 2020-02-15 MED ORDER — BUPROPION HCL ER (XL) 150 MG PO TB24: 150.0000 mg | ORAL_TABLET | Freq: Every day | ORAL | 1 refills | Status: DC

## 2020-02-15 MED ORDER — PRAMIPEXOLE DIHYDROCHLORIDE 0.5 MG PO TABS
0.5000 mg | ORAL_TABLET | Freq: Three times a day (TID) | ORAL | 1 refills | Status: DC
Start: 2020-02-15 — End: 2020-08-15

## 2020-02-15 MED ORDER — FUROSEMIDE 40 MG PO TABS: 40.0000 mg | ORAL_TABLET | Freq: Every day | ORAL | 1 refills | Status: DC

## 2020-02-15 MED ORDER — ZOLPIDEM TARTRATE 10 MG PO TABS: 10.0000 mg | ORAL_TABLET | Freq: Every evening | ORAL | 5 refills | Status: DC | PRN

## 2020-02-15 MED ORDER — OMEPRAZOLE 40 MG PO CPDR: 40.0000 mg | DELAYED_RELEASE_CAPSULE | Freq: Every day | ORAL | 1 refills | Status: DC

## 2020-02-15 MED ORDER — BUSPIRONE HCL 5 MG PO TABS: 5.0000 mg | ORAL_TABLET | Freq: Three times a day (TID) | ORAL | 5 refills | Status: DC

## 2020-02-15 MED ORDER — ESCITALOPRAM OXALATE 20 MG PO TABS: 20.0000 mg | ORAL_TABLET | Freq: Every day | ORAL | 1 refills | Status: DC

## 2020-02-15 MED ORDER — ATORVASTATIN CALCIUM 40 MG PO TABS
ORAL_TABLET | ORAL | 1 refills | Status: DC
Start: 2020-02-15 — End: 2020-07-28

## 2020-02-15 MED ORDER — FENOFIBRIC ACID 135 MG PO CPDR: 1.0000 | DELAYED_RELEASE_CAPSULE | Freq: Every day | ORAL | 1 refills | Status: DC

## 2020-02-15 NOTE — Patient Instructions (Signed)
Peripheral Edema  Peripheral edema is swelling that is caused by a buildup of fluid. Peripheral edema most often affects the lower legs, ankles, and feet. It can also develop in the arms, hands, and face. The area of the body that has peripheral edema will look swollen. It may also feel heavy or warm. Your clothes may start to feel tight. Pressing on the area may make a temporary dent in your skin. You may not be able to move your swollen arm or leg as much as usual. There are many causes of peripheral edema. It can happen because of a complication of other conditions such as congestive heart failure, kidney disease, or a problem with your blood circulation. It also can be a side effect of certain medicines or because of an infection. It often happens to women during pregnancy. Sometimes, the cause is not known. Follow these instructions at home: Managing pain, stiffness, and swelling   Raise (elevate) your legs while you are sitting or lying down.  Move around often to prevent stiffness and to lessen swelling.  Do not sit or stand for long periods of time.  Wear support stockings as told by your health care provider. Medicines  Take over-the-counter and prescription medicines only as told by your health care provider.  Your health care provider may prescribe medicine to help your body get rid of excess water (diuretic). General instructions  Pay attention to any changes in your symptoms.  Follow instructions from your health care provider about limiting salt (sodium) in your diet. Sometimes, eating less salt may reduce swelling.  Moisturize skin daily to help prevent skin from cracking and draining.  Keep all follow-up visits as told by your health care provider. This is important. Contact a health care provider if you have:  A fever.  Edema that starts suddenly or is getting worse, especially if you are pregnant or have a medical condition.  Swelling in only one leg.  Increased  swelling, redness, or pain in one or both of your legs.  Drainage or sores at the area where you have edema. Get help right away if you:  Develop shortness of breath, especially when you are lying down.  Have pain in your chest or abdomen.  Feel weak.  Feel faint. Summary  Peripheral edema is swelling that is caused by a buildup of fluid. Peripheral edema most often affects the lower legs, ankles, and feet.  Move around often to prevent stiffness and to lessen swelling. Do not sit or stand for long periods of time.  Pay attention to any changes in your symptoms.  Contact a health care provider if you have edema that starts suddenly or is getting worse, especially if you are pregnant or have a medical condition.  Get help right away if you develop shortness of breath, especially when lying down. This information is not intended to replace advice given to you by your health care provider. Make sure you discuss any questions you have with your health care provider. Document Revised: 11/06/2017 Document Reviewed: 11/06/2017 Elsevier Patient Education  2020 [employer]  

## 2020-02-15 NOTE — Progress Notes (Signed)
Subjective:    Patient ID: Stacie Solis, female    DOB: 11/09/1960, 59 y.o.   MRN: [id number]   Chief Complaint: Medical Management of Chronic Issues    HPI:  1. Mixed hyperlipidemia Tries to watch diet but does little to no exercise. Lab Results  Component Value Date   CHOL 88 (L) 05/28/2019   HDL 19 (L) 05/28/2019   LDLCALC 46 05/28/2019   TRIG 126 05/28/2019   CHOLHDL 4.6 (H) 05/28/2019     2. Gastroesophageal reflux disease without esophagitis Is on omeprazole daily and is doing well  3. Recurrent major depressive disorder, in full remission ([place]) Is on lexapro and wellbutrin daily and is doing well. Depression screen [facility] 2/9 02/15/2020 05/28/2019 04/13/2019  Decreased Interest 0 0 1  Down, Depressed, Hopeless 0 0 2  PHQ - 2 Score 0 0 3  Altered sleeping 3 - 2  Tired, decreased energy 3 - 3  Change in appetite 0 - 2  Feeling bad or failure about yourself  0 - 1  Trouble concentrating 0 - 2  Moving slowly or fidgety/restless 0 - -  Suicidal thoughts 0 - 0  PHQ-9 Score 6 - 13  Difficult doing work/chores Not difficult at all - Very difficult  Some recent data might be hidden     4. Primary insomnia Is on ambien daily and says she sleeps well when she takes it.  5. Panic attacks Is on buspar when she needs it.  6. RLS (restless legs syndrome) takes mirapex in the evenings. Works well to keep legs calm.  7. Smoker Still smokes over a pack a day  8. BMI 35.0-35.9,adult Weight is down 18lbs since last visit Wt Readings from Last 3 Encounters:  02/15/20 172 lb (78 kg)  05/28/19 190 lb (86.2 kg)  11/18/18 185 lb (83.9 kg)   BMI Readings from Last 3 Encounters:  02/15/20 31.46 kg/m  05/28/19 34.75 kg/m  11/18/18 33.84 kg/m       Outpatient Encounter Medications as of 02/15/2020  Medication Sig   albuterol (PROVENTIL HFA;VENTOLIN HFA) 108 (90 Base) MCG/ACT inhaler USE 2 PUFFS EVERY 4 HOURS AS NEEDED   atorvastatin (LIPITOR) 40 MG tablet TAKE 1  TABLET BY MOUTH EVERY DAY   buPROPion (WELLBUTRIN XL) 150 MG 24 hr tablet TAKE 1 TABLET BY MOUTH EVERY DAY   busPIRone (BUSPAR) 5 MG tablet Take 1 tablet (5 mg total) by mouth 3 (three) times daily.   cholecalciferol (VITAMIN D) 1000 UNITS tablet Take 1,000 Units by mouth daily.   Choline Fenofibrate (FENOFIBRIC ACID) 135 MG CPDR Take 1 tablet by mouth daily.   cyclobenzaprine (FLEXERIL) 10 MG tablet TAKE 1 TABLET BY MOUTH EVERYDAY AT BEDTIME   escitalopram (LEXAPRO) 20 MG tablet Take 1 tablet (20 mg total) by mouth daily.   fish oil-omega-3 fatty acids 1000 MG capsule Take 2 g by mouth 2 (two) times daily.   furosemide (LASIX) 40 MG tablet TAKE 1 TABLET BY MOUTH EVERY DAY   omeprazole (PRILOSEC) 40 MG capsule Take 1 capsule (40 mg total) by mouth daily.   pramipexole (MIRAPEX) 0.5 MG tablet Take 1 tablet (0.5 mg total) by mouth 3 (three) times daily.   triamcinolone (NASACORT) 55 MCG/ACT AERO nasal inhaler Place 2 sprays into the nose daily.   zolpidem (AMBIEN) 10 MG tablet Take 1 tablet (10 mg total) by mouth at bedtime as needed for sleep.     Past Surgical History:  Procedure Laterality Date   BRAIN  SURGERY     trig neuralgia   CHOLECYSTECTOMY      Family History  Problem Relation Age of Onset   Hypertension Mother    Diabetes Mother    Arthritis Mother    Lung cancer Maternal Grandfather     New complaints: None today  Social history: lives with her husband  Controlled substance contract: 02/15/20    Review of Systems  Constitutional: Negative for diaphoresis.  Eyes: Negative for pain.  Respiratory: Negative for shortness of breath.   Cardiovascular: Negative for chest pain, palpitations and leg swelling.  Gastrointestinal: Negative for abdominal pain.  Endocrine: Negative for polydipsia.  Skin: Negative for rash.  Neurological: Negative for dizziness, weakness and headaches.  Hematological: Does not bruise/bleed easily.  All other systems  reviewed and are negative.      Objective:   Physical Exam Vitals and nursing note reviewed.  Constitutional:      General: She is not in acute distress.    Appearance: Normal appearance. She is well-developed and well-nourished. She is diaphoretic.  HENT:     Head: Normocephalic.     Nose: Nose normal.     Mouth/Throat:     Mouth: Oropharynx is clear and moist.  Eyes:     Extraocular Movements: EOM normal.     Pupils: Pupils are equal, round, and reactive to light.  Neck:     Vascular: No carotid bruit or JVD.  Cardiovascular:     Rate and Rhythm: Normal rate and regular rhythm.     Pulses: Intact distal pulses.     Heart sounds: Normal heart sounds.  Pulmonary:     Effort: Pulmonary effort is normal. No respiratory distress.     Breath sounds: Wheezing (faint exp wheezes thorughout) present. No rales.  Chest:     Chest wall: No tenderness.  Abdominal:     General: Bowel sounds are normal. There is no distension or abdominal bruit. Aorta is normal.     Palpations: Abdomen is soft. There is no hepatomegaly, splenomegaly, mass or pulsatile mass.     Tenderness: There is no abdominal tenderness.  Musculoskeletal:        General: No edema. Normal range of motion.     Cervical back: Normal range of motion and neck supple.  Lymphadenopathy:     Cervical: No cervical adenopathy.  Skin:    General: Skin is warm.  Neurological:     Mental Status: She is alert and oriented to person, place, and time.     Deep Tendon Reflexes: Reflexes are normal and symmetric.  Psychiatric:        Mood and Affect: Mood and affect normal.        Behavior: Behavior normal.        Thought Content: Thought content normal.        Judgment: Judgment normal.     BP (!) 144/93    Pulse 84    Temp (!) 96.8 F (36 C) (Temporal)    Resp 20    Ht '5\' 2"'  (1.575 m)    Wt 172 lb (78 kg)    SpO2 95%    BMI 31.46 kg/m        Assessment & Plan:  Stacie Solis comes in today with chief complaint of  Medical Management of Chronic Issues   Diagnosis and orders addressed:  1. Mixed hyperlipidemia Low fat diet - CBC with Differential/Platelet - CMP14+EGFR - Lipid panel - atorvastatin (LIPITOR) 40 MG tablet; TAKE 1 TABLET  BY MOUTH EVERY DAY  Dispense: 90 tablet; Refill: 1 - Choline Fenofibrate (FENOFIBRIC ACID) 135 MG CPDR; Take 1 tablet by mouth daily.  Dispense: 90 capsule; Refill: 1  2. Gastroesophageal reflux disease without esophagitis Avoid spicy foods Do not eat 2 hours prior to bedtime - omeprazole (PRILOSEC) 40 MG capsule; Take 1 capsule (40 mg total) by mouth daily.  Dispense: 90 capsule; Refill: 1  3. Recurrent major depressive disorder, in full remission ([facility]) Stress management - buPROPion (WELLBUTRIN XL) 150 MG 24 hr tablet; Take 1 tablet (150 mg total) by mouth daily.  Dispense: 90 tablet; Refill: 1 - escitalopram (LEXAPRO) 20 MG tablet; Take 1 tablet (20 mg total) by mouth daily.  Dispense: 90 tablet; Refill: 1  4. Primary insomnia Bedtime routine - zolpidem (AMBIEN) 10 MG tablet; Take 1 tablet (10 mg total) by mouth at bedtime as needed for sleep.  Dispense: 30 tablet; Refill: 5  5. Panic attacks Stress management  6. RLS (restless legs syndrome) Keep legs warm at night - pramipexole (MIRAPEX) 0.5 MG tablet; Take 1 tablet (0.5 mg total) by mouth 3 (three) times daily.  Dispense: 270 tablet; Refill: 1  7. Smoker smoking cessation encouraged - DG Chest 2 View  8. BMI 35.0-35.9,adult Discussed diet and exercise for person with BMI >25 Will recheck weight in 3-6 months  9. GAD (generalized anxiety disorder) - busPIRone (BUSPAR) 5 MG tablet; Take 1 tablet (5 mg total) by mouth 3 (three) times daily.  Dispense: 90 tablet; Refill: 5  10. Peripheral edema Elevate legs when sitiing  - furosemide (LASIX) 40 MG tablet; Take 1 tablet (40 mg total) by mouth daily.  Dispense: 90 tablet; Refill: 1   Labs pending Health Maintenance reviewed Diet and exercise  encouraged  Follow up plan: 6 months   [name], FNP

## 2020-02-16 LAB — CMP14+EGFR
ALT: 32 IU/L (ref 0–32)
AST: 31 IU/L (ref 0–40)
Albumin/Globulin Ratio: 1.6 (ref 1.2–2.2)
Albumin: 4.5 g/dL (ref 3.8–4.8)
Alkaline Phosphatase: 93 IU/L (ref 44–121)
BUN/Creatinine Ratio: 16 (ref 12–28)
BUN: 21 mg/dL (ref 8–27)
Bilirubin Total: 0.5 mg/dL (ref 0.0–1.2)
CO2: 29 mmol/L (ref 20–29)
Calcium: 9.7 mg/dL (ref 8.7–10.3)
Chloride: 99 mmol/L (ref 96–106)
Creatinine, Ser: 1.33 mg/dL — ABNORMAL HIGH (ref 0.57–1.00)
GFR calc Af Amer: 50 mL/min/{1.73_m2} — ABNORMAL LOW (ref >59)
GFR calc non Af Amer: 43 mL/min/{1.73_m2} — ABNORMAL LOW (ref >59)
Globulin, Total: 2.9 g/dL (ref 1.5–4.5)
Glucose: 88 mg/dL (ref 65–99)
Potassium: 5 mmol/L (ref 3.5–5.2)
Sodium: 141 mmol/L (ref 134–144)
Total Protein: 7.4 g/dL (ref 6.0–8.5)

## 2020-02-16 LAB — CBC WITH DIFFERENTIAL/PLATELET
Basophils Absolute: 0.3 10*3/uL — ABNORMAL HIGH (ref 0.0–0.2)
Basos: 3 %
EOS (ABSOLUTE): 0.2 10*3/uL (ref 0.0–0.4)
Eos: 2 %
Hematocrit: 48.1 % — ABNORMAL HIGH (ref 34.0–46.6)
Hemoglobin: 16.1 g/dL — ABNORMAL HIGH (ref 11.1–15.9)
Immature Grans (Abs): 0.2 10*3/uL — ABNORMAL HIGH (ref 0.0–0.1)
Immature Granulocytes: 2 %
Lymphocytes Absolute: 3.3 10*3/uL — ABNORMAL HIGH (ref 0.7–3.1)
Lymphs: 33 %
MCH: 30.9 pg (ref 26.6–33.0)
MCHC: 33.5 g/dL (ref 31.5–35.7)
MCV: 92 fL (ref 79–97)
Monocytes Absolute: 0.7 10*3/uL (ref 0.1–0.9)
Monocytes: 7 %
Neutrophils Absolute: 5.5 10*3/uL (ref 1.4–7.0)
Neutrophils: 53 %
Platelets: 292 10*3/uL (ref 150–450)
RBC: 5.21 x10E6/uL (ref 3.77–5.28)
RDW: 12.3 % (ref 11.7–15.4)
WBC: 10.1 10*3/uL (ref 3.4–10.8)

## 2020-02-16 LAB — LIPID PANEL
Chol/HDL Ratio: 3.2 ratio (ref 0.0–4.4)
Cholesterol, Total: 115 mg/dL (ref 100–199)
HDL: 36 mg/dL — ABNORMAL LOW (ref >39)
LDL Chol Calc (NIH): 60 mg/dL (ref 0–99)
Triglycerides: 100 mg/dL (ref 0–149)
VLDL Cholesterol Cal: 19 mg/dL (ref 5–40)

## 2020-03-28 ENCOUNTER — Encounter: Payer: Self-pay | Admitting: Nurse Practitioner

## 2020-03-28 ENCOUNTER — Ambulatory Visit (INDEPENDENT_AMBULATORY_CARE_PROVIDER_SITE_OTHER): Payer: 59 | Admitting: Nurse Practitioner

## 2020-03-28 VITALS — Temp 97.3°F

## 2020-03-28 DIAGNOSIS — J0111 Acute recurrent frontal sinusitis: Secondary | ICD-10-CM

## 2020-03-28 MED ORDER — ONDANSETRON HCL 4 MG PO TABS: 4.0000 mg | ORAL_TABLET | Freq: Three times a day (TID) | ORAL | 0 refills | Status: DC | PRN

## 2020-03-28 MED ORDER — AMOXICILLIN-POT CLAVULANATE 875-125 MG PO TABS: 1.0000 | ORAL_TABLET | Freq: Two times a day (BID) | ORAL | 0 refills | Status: DC

## 2020-03-28 MED ORDER — SALINE SPRAY 0.65 % NA SOLN: 1.0000 | NASAL | 2 refills | Status: DC | PRN

## 2020-03-28 NOTE — Assessment & Plan Note (Signed)
Acute recurrent frontal sinusitis not well controlled.  Patient is reporting head pressure, nausea, fatigue, headache, neck pain, eye pain and cough spells. Started patient on Zofran for nausea, Augmentin and guaifenesin for cough and congestion. Education provided to patient, encourage increase hydration, monitor oxygen saturation and fever.  Advised patient to follow-up with worsening unresolved symptoms.  Rx sent to pharmacy.

## 2020-03-28 NOTE — Progress Notes (Signed)
   Virtual Visit via telephone Note Due to COVID-19 pandemic this visit was conducted virtually. This visit type was conducted due to national recommendations for restrictions regarding the COVID-19 Pandemic (e.g. social distancing, sheltering in place) in an effort to limit this patient's exposure and mitigate transmission in our community. All issues noted in this document were discussed and addressed.  A physical exam was not performed with this format.  I connected with Stacie Solis on 03/28/20 at  12:40 PM by telephone and verified that I am speaking with the correct person using two identifiers. Stacie Solis is currently located at home during visit. The provider, Ivy Lynn, NP is located in their office at time of visit.  I discussed the limitations, risks, security and privacy concerns of performing an evaluation and management service by telephone and the availability of in person appointments. I also discussed with the patient that there may be a patient responsible charge related to this service. The patient expressed understanding and agreed to proceed.   History and Present Illness:  Sinusitis This is a recurrent problem. The current episode started in the past 7 days. The problem has been gradually worsening since onset. There has been no fever. The pain is moderate. Associated symptoms include chills, congestion and coughing. Pertinent negatives include no ear pain or shortness of breath. (Eye pain) Past treatments include nothing.      Review of Systems  Constitutional: Positive for chills.  HENT: Positive for congestion and sinus pain. Negative for ear pain.   Eyes: Positive for pain.  Respiratory: Positive for cough. Negative for shortness of breath.   Cardiovascular: Negative.  Negative for chest pain.  Musculoskeletal: Negative.        Fatigue  Neurological: Negative.   All other systems reviewed and are negative.    Observations/Objective: Televisit.  Patient  did not sound to be in distress.  Assessment and Plan: Acute recurrent frontal sinusitis Acute recurrent frontal sinusitis not well controlled.  Patient is reporting head pressure, nausea, fatigue, headache, neck pain, eye pain and cough spells. Started patient on Zofran for nausea, Augmentin and guaifenesin for cough and congestion. Education provided to patient, encourage increase hydration, monitor oxygen saturation and fever.  Advised patient to follow-up with worsening unresolved symptoms.  Rx sent to pharmacy.   Follow Up Instructions: Follow up with worsening unresolved symptoms.    I discussed the assessment and treatment plan with the patient. The patient was provided an opportunity to ask questions and all were answered. The patient agreed with the plan and demonstrated an understanding of the instructions.   The patient was advised to call back or seek an in-person evaluation if the symptoms worsen or if the condition fails to improve as anticipated.  The above assessment and management plan was discussed with the patient. The patient verbalized understanding of and has agreed to the management plan. Patient is aware to call the clinic if symptoms persist or worsen. Patient is aware when to return to the clinic for a follow-up visit. Patient educated on when it is appropriate to go to the emergency department.   Time call ended: 12:49 PM I provided 9 minutes of non-face-to-face time during this encounter.    Ivy Lynn, NP

## 2020-03-28 NOTE — Patient Instructions (Signed)

## 2020-04-05 ENCOUNTER — Other Ambulatory Visit: Payer: Self-pay | Admitting: Nurse Practitioner

## 2020-04-05 ENCOUNTER — Telehealth: Payer: Self-pay

## 2020-04-05 MED ORDER — MAGIC MOUTHWASH: 5.0000 mL | Freq: Every day | ORAL | 1 refills | Status: DC

## 2020-04-05 NOTE — Telephone Encounter (Signed)
Magic mouthwash ordered to the pharmacy, I sent it twice one looks like it got printed and the other one was sent to the pharmacy.

## 2020-04-05 NOTE — Telephone Encounter (Signed)
Pt called stating that she was prescribed an antibiotic last week, which she has been taking, but now says she has developed blisters in her mouth and needs Rx for magic mouth wash.  Pharmacy: [facility]

## 2020-04-06 NOTE — Telephone Encounter (Signed)
Patient aware.

## 2020-04-11 ENCOUNTER — Other Ambulatory Visit: Payer: Self-pay | Admitting: *Deleted

## 2020-04-11 DIAGNOSIS — J0111 Acute recurrent frontal sinusitis: Secondary | ICD-10-CM

## 2020-04-11 MED ORDER — ONDANSETRON HCL 4 MG PO TABS: 4.0000 mg | ORAL_TABLET | Freq: Three times a day (TID) | ORAL | 0 refills | Status: DC | PRN

## 2020-04-16 ENCOUNTER — Other Ambulatory Visit: Payer: Self-pay | Admitting: Nurse Practitioner

## 2020-04-16 DIAGNOSIS — E782 Mixed hyperlipidemia: Secondary | ICD-10-CM

## 2020-04-18 NOTE — Telephone Encounter (Signed)
Pharmacy comment: Alternative Requested: PRIOR AUTH REQUIRED ONMED THIS YEAR; PA REQUIRED.

## 2020-04-20 NOTE — Telephone Encounter (Signed)
PA in process for choline fenofibrate acid  Key: [id number] help? Call us at [phone] A prior authorization request for this medication is currently in-progress with [employer]. If you have any questions about this request, please contact [phone].

## 2020-05-04 ENCOUNTER — Other Ambulatory Visit: Payer: Self-pay | Admitting: Nurse Practitioner

## 2020-05-04 DIAGNOSIS — J0111 Acute recurrent frontal sinusitis: Secondary | ICD-10-CM

## 2020-05-16 ENCOUNTER — Other Ambulatory Visit: Payer: Self-pay | Admitting: Nurse Practitioner

## 2020-05-16 ENCOUNTER — Telehealth: Payer: Self-pay

## 2020-05-16 MED ORDER — FENOFIBRATE 134 MG PO CAPS: 134.0000 mg | ORAL_CAPSULE | Freq: Every day | ORAL | 11 refills | Status: DC

## 2020-05-16 NOTE — Telephone Encounter (Signed)
Patient aware pa denied and we will send to Stacie Collum, FNP to review.

## 2020-05-16 NOTE — Telephone Encounter (Addendum)
PA denied fenofibrate 135mg . Patient must fail fenofibrate 134, 200, 43, or 67mg , Fenofibrate-160 or 54mg , fenofibrate acid 45mg  Please advise

## 2020-05-17 ENCOUNTER — Encounter: Payer: Self-pay | Admitting: Nurse Practitioner

## 2020-05-17 ENCOUNTER — Ambulatory Visit: Payer: 59 | Admitting: Nurse Practitioner

## 2020-05-17 DIAGNOSIS — J440 Chronic obstructive pulmonary disease with acute lower respiratory infection: Secondary | ICD-10-CM | POA: Diagnosis not present

## 2020-05-17 DIAGNOSIS — J209 Acute bronchitis, unspecified: Secondary | ICD-10-CM

## 2020-05-17 MED ORDER — AZITHROMYCIN 250 MG PO TABS: ORAL_TABLET | ORAL | 0 refills | Status: DC

## 2020-05-17 MED ORDER — BENZONATATE 100 MG PO CAPS: 100.0000 mg | ORAL_CAPSULE | Freq: Three times a day (TID) | ORAL | 0 refills | Status: DC | PRN

## 2020-05-17 MED ORDER — AMOXICILLIN-POT CLAVULANATE 875-125 MG PO TABS: 1.0000 | ORAL_TABLET | Freq: Two times a day (BID) | ORAL | 0 refills | Status: DC

## 2020-05-17 NOTE — Progress Notes (Signed)
Virtual Visit via telephone Note Due to COVID-19 pandemic this visit was conducted virtually. This visit type was conducted due to national recommendations for restrictions regarding the COVID-19 Pandemic (e.g. social distancing, sheltering in place) in an effort to limit this patient's exposure and mitigate transmission in our community. All issues noted in this document were discussed and addressed.  A physical exam was not performed with this format.  I connected with Stacie Solis on 05/17/20 at 1:30 by telephone and verified that I am speaking with the correct person using two identifiers. Stacie Solis is currently located at home and no one is currently with her during visit. The provider, [name], FNP is located in their office at time of visit.  I discussed the limitations, risks, security and privacy concerns of performing an evaluation and management service by telephone and the availability of in person appointments. I also discussed with the patient that there may be a patient responsible charge related to this service. The patient expressed understanding and agreed to proceed.   History and Present Illness:   Chief Complaint: URI   HPI Patient calls in for telephone visit. She says she has been coughing, with congestion. Feels mucus in back of throat but cannot get it to come up. started about 2weeks ago. Has gotten worse. Uses flonase OTC as well as mucinex. Started getting ear pain on right side this morning.   Review of Systems  Constitutional: Positive for malaise/fatigue. Negative for chills and fever.  HENT: Positive for congestion and ear pain. Negative for ear discharge and sore throat.   Respiratory: Positive for cough and sputum production. Negative for shortness of breath.   Musculoskeletal: Negative for myalgias.  Neurological: Negative for dizziness and headaches.  All other systems reviewed and are negative.    Observations/Objective: Alert and  oriented- answers all questions appropriately No distress Hoarse Dry cough noted   Assessment and Plan: Stacie Solis in today with chief complaint of URI   1. Acute bronchitis with COPD ([place]) 1. Take meds as prescribed 2. Use a cool mist humidifier especially during the winter months and when heat has been humid. 3. Use saline nose sprays frequently 4. Saline irrigations of the nose can be very helpful if done frequently.  * 4X daily for 1 week*  * Use of a nettie pot can be helpful with this. Follow directions with this* 5. Drink plenty of fluids 6. Keep thermostat turn down low 7.For any cough or congestion  Use plain Mucinex- regular strength or max strength is fine   * Children- consult with Pharmacist for dosing 8. For fever or aces or pains- take tylenol or ibuprofen appropriate for age and weight.  * for fevers greater than 101 orally you may alternate ibuprofen and tylenol every  3 hours.   Meds ordered this encounter  Medications  . DISCONTD: azithromycin (ZITHROMAX Z-PAK) 250 MG tablet    Sig: As directed    Dispense:  6 tablet    Refill:  0    Order Specific Question:   Supervising Provider    Answer:   Caryl Pina A [id number]  . benzonatate (TESSALON PERLES) 100 MG capsule    Sig: Take 1 capsule (100 mg total) by mouth 3 (three) times daily as needed.    Dispense:  20 capsule    Refill:  0    Order Specific Question:   Supervising Provider    Answer:   Caryl Pina A [id number]  .  amoxicillin-clavulanate (AUGMENTIN) 875-125 MG tablet    Sig: Take 1 tablet by mouth 2 (two) times daily.    Dispense:  14 tablet    Refill:  0    Order Specific Question:   Supervising Provider    Answer:   Caryl Pina A [[id number]]         Follow Up Instructions: prn    I discussed the assessment and treatment plan with the patient. The patient was provided an opportunity to ask questions and all were answered. The patient agreed with the plan and  demonstrated an understanding of the instructions.   The patient was advised to call back or seek an in-person evaluation if the symptoms worsen or if the condition fails to improve as anticipated.  The above assessment and management plan was discussed with the patient. The patient verbalized understanding of and has agreed to the management plan. Patient is aware to call the clinic if symptoms persist or worsen. Patient is aware when to return to the clinic for a follow-up visit. Patient educated on when it is appropriate to go to the emergency department.   Time call ended:  1:42 I provided 12 minutes of non-face-to-face time during this encounter.    [name], FNP

## 2020-05-26 ENCOUNTER — Other Ambulatory Visit: Payer: Self-pay | Admitting: Nurse Practitioner

## 2020-05-26 DIAGNOSIS — J0111 Acute recurrent frontal sinusitis: Secondary | ICD-10-CM

## 2020-06-02 ENCOUNTER — Other Ambulatory Visit: Payer: Self-pay | Admitting: Nurse Practitioner

## 2020-06-02 DIAGNOSIS — K219 Gastro-esophageal reflux disease without esophagitis: Secondary | ICD-10-CM

## 2020-07-07 DIAGNOSIS — M5416 Radiculopathy, lumbar region: Secondary | ICD-10-CM | POA: Diagnosis not present

## 2020-07-12 ENCOUNTER — Other Ambulatory Visit: Payer: Self-pay | Admitting: Nurse Practitioner

## 2020-07-12 DIAGNOSIS — J0111 Acute recurrent frontal sinusitis: Secondary | ICD-10-CM

## 2020-07-12 DIAGNOSIS — F3342 Major depressive disorder, recurrent, in full remission: Secondary | ICD-10-CM

## 2020-07-19 ENCOUNTER — Ambulatory Visit (INDEPENDENT_AMBULATORY_CARE_PROVIDER_SITE_OTHER): Payer: BC Managed Care – PPO | Admitting: Family Medicine

## 2020-07-19 ENCOUNTER — Other Ambulatory Visit: Payer: Self-pay

## 2020-07-19 ENCOUNTER — Encounter: Payer: Self-pay | Admitting: Family Medicine

## 2020-07-19 VITALS — BP 114/71 | HR 88 | Temp 97.5°F | Ht 62.0 in | Wt 181.0 lb

## 2020-07-19 DIAGNOSIS — L03119 Cellulitis of unspecified part of limb: Secondary | ICD-10-CM | POA: Diagnosis not present

## 2020-07-19 DIAGNOSIS — L02419 Cutaneous abscess of limb, unspecified: Secondary | ICD-10-CM

## 2020-07-19 MED ORDER — MUPIROCIN 2 % EX OINT: TOPICAL_OINTMENT | CUTANEOUS | 1 refills | Status: AC

## 2020-07-19 MED ORDER — AMOXICILLIN 875 MG PO TABS: 875.0000 mg | ORAL_TABLET | Freq: Two times a day (BID) | ORAL | 0 refills | Status: AC

## 2020-07-19 MED ORDER — FLUCONAZOLE 150 MG PO TABS: 150.0000 mg | ORAL_TABLET | Freq: Once | ORAL | 0 refills | Status: AC

## 2020-07-19 NOTE — Progress Notes (Signed)
No chief complaint on file.   HPI  Patient presents today for pain and rednress at site of sore on the right leg  PMH: Smoking status noted ROS: Per HPI  Objective: BP 114/71   Pulse 88   Temp (!) 97.5 F (36.4 C)   Ht 5\' 2"  (1.575 m)   Wt 181 lb (82.1 kg)   SpO2 100%   BMI 33.11 kg/m  Gen: NAD, alert, cooperative with exam HEENT: NCAT,  CV: RRR, good S1/S2, no murmur Resp: CTABL, no wheezes, non-labored Ext: No edema, warm. Mild erythema with grade 1 avbrasion right anterior lower leg Neuro: Alert and oriented, No gross deficits  Assessment and plan:  1. Cellulitis and abscess of leg     Meds ordered this encounter  Medications  . mupirocin ointment (BACTROBAN) 2 %    Sig: Apply and cover with bandage twice daily    Dispense:  30 g    Refill:  1  . amoxicillin (AMOXIL) 875 MG tablet    Sig: Take 1 tablet (875 mg total) by mouth 2 (two) times daily for 10 days.    Dispense:  20 tablet    Refill:  0  . fluconazole (DIFLUCAN) 150 MG tablet    Sig: Take 1 tablet (150 mg total) by mouth once for 1 dose. At onset of symptoms. Repeat at end of treatment    Dispense:  2 tablet    Refill:  0    No orders of the defined types were placed in this encounter.   Follow up as needed.  Claretta Fraise, MD

## 2020-07-21 DIAGNOSIS — G894 Chronic pain syndrome: Secondary | ICD-10-CM | POA: Diagnosis not present

## 2020-07-22 ENCOUNTER — Other Ambulatory Visit: Payer: Self-pay | Admitting: Nurse Practitioner

## 2020-07-22 DIAGNOSIS — Z1231 Encounter for screening mammogram for malignant neoplasm of breast: Secondary | ICD-10-CM

## 2020-07-25 ENCOUNTER — Encounter: Payer: Self-pay | Admitting: Family Medicine

## 2020-07-28 ENCOUNTER — Telehealth: Payer: Self-pay | Admitting: Nurse Practitioner

## 2020-07-28 ENCOUNTER — Other Ambulatory Visit: Payer: Self-pay | Admitting: Nurse Practitioner

## 2020-07-28 DIAGNOSIS — F3342 Major depressive disorder, recurrent, in full remission: Secondary | ICD-10-CM

## 2020-07-28 DIAGNOSIS — K219 Gastro-esophageal reflux disease without esophagitis: Secondary | ICD-10-CM

## 2020-07-28 DIAGNOSIS — E782 Mixed hyperlipidemia: Secondary | ICD-10-CM

## 2020-08-02 ENCOUNTER — Other Ambulatory Visit: Payer: Self-pay | Admitting: Nurse Practitioner

## 2020-08-02 DIAGNOSIS — K219 Gastro-esophageal reflux disease without esophagitis: Secondary | ICD-10-CM

## 2020-08-04 NOTE — Telephone Encounter (Signed)
Insurance states patient not found. We need updated copy of insurance card

## 2020-08-11 ENCOUNTER — Other Ambulatory Visit: Payer: Self-pay | Admitting: *Deleted

## 2020-08-11 DIAGNOSIS — J0111 Acute recurrent frontal sinusitis: Secondary | ICD-10-CM

## 2020-08-11 MED ORDER — SALINE SPRAY 0.65 % NA SOLN: 1.0000 | NASAL | 2 refills | Status: DC | PRN

## 2020-08-15 ENCOUNTER — Other Ambulatory Visit: Payer: Self-pay | Admitting: Nurse Practitioner

## 2020-08-15 ENCOUNTER — Other Ambulatory Visit: Payer: Self-pay

## 2020-08-15 ENCOUNTER — Encounter: Payer: Self-pay | Admitting: Nurse Practitioner

## 2020-08-15 ENCOUNTER — Ambulatory Visit (INDEPENDENT_AMBULATORY_CARE_PROVIDER_SITE_OTHER): Payer: BC Managed Care – PPO | Admitting: Nurse Practitioner

## 2020-08-15 VITALS — BP 117/72 | HR 80 | Temp 98.0°F | Resp 20 | Ht 62.0 in | Wt 181.0 lb

## 2020-08-15 DIAGNOSIS — F410 Panic disorder [episodic paroxysmal anxiety] without agoraphobia: Secondary | ICD-10-CM | POA: Diagnosis not present

## 2020-08-15 DIAGNOSIS — F3342 Major depressive disorder, recurrent, in full remission: Secondary | ICD-10-CM | POA: Diagnosis not present

## 2020-08-15 DIAGNOSIS — F17200 Nicotine dependence, unspecified, uncomplicated: Secondary | ICD-10-CM

## 2020-08-15 DIAGNOSIS — M545 Low back pain, unspecified: Secondary | ICD-10-CM

## 2020-08-15 DIAGNOSIS — F5101 Primary insomnia: Secondary | ICD-10-CM

## 2020-08-15 DIAGNOSIS — E782 Mixed hyperlipidemia: Secondary | ICD-10-CM

## 2020-08-15 DIAGNOSIS — K219 Gastro-esophageal reflux disease without esophagitis: Secondary | ICD-10-CM

## 2020-08-15 DIAGNOSIS — G2581 Restless legs syndrome: Secondary | ICD-10-CM

## 2020-08-15 DIAGNOSIS — R609 Edema, unspecified: Secondary | ICD-10-CM

## 2020-08-15 DIAGNOSIS — G8929 Other chronic pain: Secondary | ICD-10-CM

## 2020-08-15 DIAGNOSIS — L03115 Cellulitis of right lower limb: Secondary | ICD-10-CM

## 2020-08-15 DIAGNOSIS — Z6835 Body mass index (BMI) 35.0-35.9, adult: Secondary | ICD-10-CM

## 2020-08-15 DIAGNOSIS — F411 Generalized anxiety disorder: Secondary | ICD-10-CM

## 2020-08-15 MED ORDER — ATORVASTATIN CALCIUM 40 MG PO TABS: 1.0000 | ORAL_TABLET | Freq: Every day | ORAL | 1 refills | Status: DC

## 2020-08-15 MED ORDER — ESCITALOPRAM OXALATE 20 MG PO TABS: 1.0000 | ORAL_TABLET | Freq: Every day | ORAL | 1 refills | Status: DC

## 2020-08-15 MED ORDER — ZOLPIDEM TARTRATE 10 MG PO TABS: 10.0000 mg | ORAL_TABLET | Freq: Every evening | ORAL | 5 refills | Status: DC | PRN

## 2020-08-15 MED ORDER — FLUCONAZOLE 150 MG PO TABS: 150.0000 mg | ORAL_TABLET | Freq: Once | ORAL | 0 refills | Status: AC

## 2020-08-15 MED ORDER — PRAMIPEXOLE DIHYDROCHLORIDE 0.5 MG PO TABS: 0.5000 mg | ORAL_TABLET | Freq: Three times a day (TID) | ORAL | 1 refills | Status: DC

## 2020-08-15 MED ORDER — CEPHALEXIN 500 MG PO CAPS: 500.0000 mg | ORAL_CAPSULE | Freq: Three times a day (TID) | ORAL | 0 refills | Status: DC

## 2020-08-15 MED ORDER — OMEPRAZOLE 40 MG PO CPDR: 40.0000 mg | DELAYED_RELEASE_CAPSULE | Freq: Every day | ORAL | 1 refills | Status: DC

## 2020-08-15 MED ORDER — BUSPIRONE HCL 5 MG PO TABS
5.0000 mg | ORAL_TABLET | Freq: Three times a day (TID) | ORAL | 5 refills | Status: DC
Start: 2020-08-15 — End: 2021-02-28

## 2020-08-15 MED ORDER — FENOFIBRATE 134 MG PO CAPS: 134.0000 mg | ORAL_CAPSULE | Freq: Every day | ORAL | 11 refills | Status: DC

## 2020-08-15 MED ORDER — FUROSEMIDE 40 MG PO TABS: 40.0000 mg | ORAL_TABLET | Freq: Every day | ORAL | 1 refills | Status: DC

## 2020-08-15 MED ORDER — BUPROPION HCL ER (XL) 150 MG PO TB24: 1.0000 | ORAL_TABLET | Freq: Every day | ORAL | 1 refills | Status: DC

## 2020-08-15 NOTE — Patient Instructions (Signed)
Managing the Challenge of Quitting Smoking Quitting smoking is a physical and mental challenge. You will face cravings, withdrawal symptoms, and temptation. Before quitting, work with your health care provider to make a plan that can help you manage quitting. Preparation canhelp you quit and keep you from giving in. How to manage lifestyle changes Managing stress Stress can make you want to smoke, and wanting to smoke may cause stress. It is important to find ways to manage your stress. You might try some of the following: Practice relaxation techniques. Breathe slowly and deeply, in through your nose and out through your mouth. Listen to music. Soak in a bath or take a shower. Imagine a peaceful place or vacation. Get some support. Talk with family or friends about your stress. Join a support group. Talk with a counselor or therapist. Get some physical activity. Go for a walk, run, or bike ride. Play a favorite sport. Practice yoga.  Medicines Talk with your health care provider about medicines that might help you dealwith cravings and make quitting easier for you. Relationships Social situations can be difficult when you are quitting smoking. To manage this, you can: Avoid parties and other social situations where people might be smoking. Avoid alcohol. Leave right away if you have the urge to smoke. Explain to your family and friends that you are quitting smoking. Ask for support and let them know you might be a bit grumpy. Plan activities where smoking is not an option. General instructions Be aware that many people gain weight after they quit smoking. However, not everyone does. To keep from gaining weight, have a plan in place before you quit and stick to the plan after you quit. Your plan should include: Having healthy snacks. When you have a craving, it may help to: Eat popcorn, carrots, celery, or other cut vegetables. Chew sugar-free gum. Changing how you eat. Eat small  portion sizes at meals. Eat 4-6 small meals throughout the day instead of 1-2 large meals a day. Be mindful when you eat. Do not watch television or do other things that might distract you as you eat. Exercising regularly. Make time to exercise each day. If you do not have time for a long workout, do short bouts of exercise for 5-10 minutes several times a day. Do some form of strengthening exercise, such as weight lifting. Do some exercise that gets your heart beating and causes you to breathe deeply, such as walking fast, running, swimming, or biking. This is very important. Drinking plenty of water or other low-calorie or no-calorie drinks. Drink 6-8 glasses of water daily.  How to recognize withdrawal symptoms Your body and mind may experience discomfort as you try to get used to not having nicotine in your system. These effects are called withdrawal symptoms. They may include: Feeling hungrier than normal. Having trouble concentrating. Feeling irritable or restless. Having trouble sleeping. Feeling depressed. Craving a cigarette. To manage withdrawal symptoms: Avoid places, people, and activities that trigger your cravings. Remember why you want to quit. Get plenty of sleep. Avoid coffee and other caffeinated drinks. These may worsen some of your symptoms. These symptoms may surprise you. But be assured that they are normal to havewhen quitting smoking. How to manage cravings Come up with a plan for how to deal with your cravings. The plan should include the following: A definition of the specific situation you want to deal with. An alternative action you will take. A clear idea for how this action will help. The   name of someone who might help you with this. Cravings usually last for 5-10 minutes. Consider taking the following actions to help you with your plan to deal with cravings: Keep your mouth busy. Chew sugar-free gum. Suck on hard candies or a straw. Brush your  teeth. Keep your hands and body busy. Change to a different activity right away. Squeeze or play with a ball. Do an activity or a hobby, such as making bead jewelry, practicing needlepoint, or working with wood. Mix up your normal routine. Take a short exercise break. Go for a quick walk or run up and down stairs. Focus on doing something kind or helpful for someone else. Call a friend or family member to talk during a craving. Join a support group. Contact a quitline. Where to find support To get help or find a support group: Call the National Cancer Institute's Smoking Quitline: 1-800-QUIT NOW (784-8669) Visit the website of the Substance Abuse and Mental Health Services Administration: www.samhsa.gov Text QUIT to SmokefreeTXT: 478848 Where to find more information Visit these websites to find more information on quitting smoking: National Cancer Institute: www.smokefree.gov American Lung Association: www.lung.org American Cancer Society: www.cancer.org Centers for Disease Control and Prevention: www.cdc.gov American Heart Association: www.heart.org Contact a health care provider if: You want to change your plan for quitting. The medicines you are taking are not helping. Your eating feels out of control or you cannot sleep. Get help right away if: You feel depressed or become very anxious. Summary Quitting smoking is a physical and mental challenge. You will face cravings, withdrawal symptoms, and temptation to smoke again. Preparation can help you as you go through these challenges. Try different techniques to manage stress, handle social situations, and prevent weight gain. You can deal with cravings by keeping your mouth busy (such as by chewing gum), keeping your hands and body busy, calling family or friends, or contacting a quitline for people who want to quit smoking. You can deal with withdrawal symptoms by avoiding places where people smoke, getting plenty of rest, and  avoiding drinks with caffeine. This information is not intended to replace advice given to you by your health care provider. Make sure you discuss any questions you have with your healthcare provider. Document Revised: 12/02/2018 Document Reviewed: 12/02/2018 Elsevier Patient Education  2022 Elsevier Inc.  

## 2020-08-15 NOTE — Progress Notes (Signed)
Subjective:    Patient ID: Stacie Solis, female    DOB: [birth year], 60 y.o.   MRN: [id number]   Chief Complaint: Medical Management of Chronic Issues    HPI:  1. Mixed hyperlipidemia Does not really watch diet. Does very little exercise. Lab Results  Component Value Date   CHOL 115 02/15/2020   HDL 36 (L) 02/15/2020   LDLCALC 60 02/15/2020   TRIG 100 02/15/2020   CHOLHDL 3.2 02/15/2020     2. Gastroesophageal reflux disease without esophagitis I son omeprazole daily and is doing well.  3. Recurrent major depressive disorder, in full remission ([place]) Is on wellbutrin and lexapro and is doing okay.  Depression screen [facility] 2/9 08/15/2020 07/19/2020 07/19/2020  Decreased Interest 1 1 0  Down, Depressed, Hopeless 1 0 0  PHQ - 2 Score 2 1 0  Altered sleeping 3 0 -  Tired, decreased energy 3 0 -  Change in appetite 1 2 -  Feeling bad or failure about yourself  1 2 -  Trouble concentrating 1 2 -  Moving slowly or fidgety/restless 0 2 -  Suicidal thoughts 0 0 -  PHQ-9 Score 11 9 -  Difficult doing work/chores Somewhat difficult Somewhat difficult -  Some recent data might be hidden     4. Panic attacks Is on buspar and takes 2-3 x a day. Depends on how her day is going. GAD 7 : Generalized Anxiety Score 08/15/2020 07/19/2020 02/15/2020 05/28/2019  Nervous, Anxious, on Edge 3 1 1 1   Control/stop worrying 3 1 3 3   Worry too much - different things 3 1 3 3   Trouble relaxing 2 1 3 3   Restless 2 1 3 3   Easily annoyed or irritable 2 1 1 3   Afraid - awful might happen 1 1 0 0  Total GAD 7 Score 16 7 14 16   Anxiety Difficulty Not difficult at all Not difficult at all Not difficult at all Not difficult at all      5. RLS (restless legs syndrome) Is on mirapex which helps calm her legs at night so she can sleep.  6. Primary insomnia Is on ambien nightly. Sleeps about 4-5 hours a night. Says she feels rested in mornings  7. Chronic midline low back pain without sciatica Has  chronic back and sees pain management  8. Smoker Smokes over a pack a day. She has no desire to quit  9. BMI 35.0-35.9,adult No recent weight changes Wt Readings from Last 3 Encounters:  08/15/20 181 lb (82.1 kg)  07/19/20 181 lb (82.1 kg)  02/15/20 172 lb (78 kg)   /\ BMI Readings from Last 3 Encounters:  08/15/20 33.11 kg/m  07/19/20 33.11 kg/m  02/15/20 31.46 kg/m       Outpatient Encounter Medications as of 08/15/2020  Medication Sig   albuterol (PROVENTIL HFA;VENTOLIN HFA) 108 (90 Base) MCG/ACT inhaler USE 2 PUFFS EVERY 4 HOURS AS NEEDED   atorvastatin (LIPITOR) 40 MG tablet TAKE 1 TABLET BY MOUTH EVERY DAY   buPROPion (WELLBUTRIN XL) 150 MG 24 hr tablet TAKE 1 TABLET BY MOUTH EVERY DAY   busPIRone (BUSPAR) 5 MG tablet Take 1 tablet (5 mg total) by mouth 3 (three) times daily.   cholecalciferol (VITAMIN D) 1000 UNITS tablet Take 1,000 Units by mouth daily.   cyclobenzaprine (FLEXERIL) 10 MG tablet TAKE 1 TABLET BY MOUTH EVERYDAY AT BEDTIME   escitalopram (LEXAPRO) 20 MG tablet TAKE 1 TABLET BY MOUTH EVERY DAY   fenofibrate micronized (LOFIBRA)  134 MG capsule Take 1 capsule (134 mg total) by mouth daily before breakfast.   fish oil-omega-3 fatty acids 1000 MG capsule Take 2 g by mouth 2 (two) times daily.   furosemide (LASIX) 40 MG tablet Take 1 tablet (40 mg total) by mouth daily.   magic mouthwash SOLN Take 5 mLs by mouth daily.   mupirocin ointment (BACTROBAN) 2 % Apply and cover with bandage twice daily   omeprazole (PRILOSEC) 40 MG capsule TAKE 1 CAPSULE (40 MG TOTAL) BY MOUTH DAILY.   ondansetron (ZOFRAN) 4 MG tablet TAKE 1 TABLET BY MOUTH EVERY 8 HOURS AS NEEDED FOR NAUSEA AND VOMITING   pramipexole (MIRAPEX) 0.5 MG tablet Take 1 tablet (0.5 mg total) by mouth 3 (three) times daily.   sodium chloride (OCEAN) 0.65 % SOLN nasal spray Place 1 spray into both nostrils as needed for congestion.   triamcinolone (NASACORT) 55 MCG/ACT AERO nasal inhaler PLACE 2 SPRAYS  INTO THE NOSE DAILY.   zolpidem (AMBIEN) 10 MG tablet Take 1 tablet (10 mg total) by mouth at bedtime as needed for sleep.   No facility-administered encounter medications on file as of 08/15/2020.    Past Surgical History:  Procedure Laterality Date   BRAIN SURGERY     trig neuralgia   CHOLECYSTECTOMY      Family History  Problem Relation Age of Onset   Hypertension Mother    Diabetes Mother    Arthritis Mother    Lung cancer Maternal Grandfather     New complaints: None today  Social history: Lives with her husband  Controlled substance contract: n/a     Review of Systems  Constitutional:  Negative for diaphoresis.  Eyes:  Negative for pain.  Respiratory:  Negative for shortness of breath.   Cardiovascular:  Negative for chest pain, palpitations and leg swelling.  Gastrointestinal:  Negative for abdominal pain.  Endocrine: Negative for polydipsia.  Skin:  Negative for rash.  Neurological:  Negative for dizziness, weakness and headaches.  Hematological:  Does not bruise/bleed easily.  All other systems reviewed and are negative.     Objective:   Physical Exam Vitals and nursing note reviewed.  Constitutional:      General: She is not in acute distress.    Appearance: Normal appearance. She is well-developed.  HENT:     Head: Normocephalic.     Right Ear: Tympanic membrane normal.     Left Ear: Tympanic membrane normal.     Nose: Nose normal.     Mouth/Throat:     Mouth: Mucous membranes are moist.  Eyes:     Pupils: Pupils are equal, round, and reactive to light.  Neck:     Vascular: No carotid bruit or JVD.  Cardiovascular:     Rate and Rhythm: Normal rate and regular rhythm.     Heart sounds: Normal heart sounds.  Pulmonary:     Effort: Pulmonary effort is normal. No respiratory distress.     Breath sounds: Normal breath sounds. No wheezing or rales.  Chest:     Chest wall: No tenderness.  Abdominal:     General: Bowel sounds are normal. There  is no distension or abdominal bruit.     Palpations: Abdomen is soft. There is no hepatomegaly, splenomegaly, mass or pulsatile mass.     Tenderness: There is no abdominal tenderness.  Musculoskeletal:        General: Normal range of motion.     Cervical back: Normal range of motion and neck supple.  Right lower leg: Edema present.     Left lower leg: Edema present.  Lymphadenopathy:     Cervical: No cervical adenopathy.  Skin:    General: Skin is warm and dry.     Findings: Lesion (erythematous hot to touch lesions on right lowe rleg) present.  Neurological:     Mental Status: She is alert and oriented to person, place, and time.     Deep Tendon Reflexes: Reflexes are normal and symmetric.  Psychiatric:        Behavior: Behavior normal.        Thought Content: Thought content normal.        Judgment: Judgment normal.   BP 117/72   Pulse 80   Temp 98 F (36.7 C) (Temporal)   Resp 20   Ht $R'5\' 2"'QZ$  (1.575 m)   Wt 181 lb (82.1 kg)   SpO2 98%   BMI 33.11 kg/m        Assessment & Plan:   ARIZA EVANS comes in today with chief complaint of Medical Management of Chronic Issues   Diagnosis and orders addressed:  1. Mixed hyperlipidemia Low fat diet - atorvastatin (LIPITOR) 40 MG tablet; Take 1 tablet (40 mg total) by mouth daily.  Dispense: 90 tablet; Refill: 1 - fenofibrate micronized (LOFIBRA) 134 MG capsule; Take 1 capsule (134 mg total) by mouth daily before breakfast.  Dispense: 30 capsule; Refill: 11 - CMP14+EGFR - CBC with Differential/Platelet - Lipid panel  2. Gastroesophageal reflux disease without esophagitis Avoid spicy foods Do not eat 2 hours prior to bedtime - omeprazole (PRILOSEC) 40 MG capsule; Take 1 capsule (40 mg total) by mouth daily.  Dispense: 90 capsule; Refill: 1  3. Recurrent major depressive disorder, in full remission ([place]) Stress management - buPROPion (WELLBUTRIN XL) 150 MG 24 hr tablet; Take 1 tablet (150 mg total) by mouth daily.   Dispense: 90 tablet; Refill: 1 - escitalopram (LEXAPRO) 20 MG tablet; Take 1 tablet (20 mg total) by mouth daily.  Dispense: 90 tablet; Refill: 1  4. Panic attacks  5. RLS (restless legs syndrome) Keep leg swarm at night - pramipexole (MIRAPEX) 0.5 MG tablet; Take 1 tablet (0.5 mg total) by mouth 3 (three) times daily.  Dispense: 270 tablet; Refill: 1  6. Primary insomnia Bedtime routine - zolpidem (AMBIEN) 10 MG tablet; Take 1 tablet (10 mg total) by mouth at bedtime as needed for sleep.  Dispense: 30 tablet; Refill: 5  7. Chronic midline low back pain without sciatica Kep follow up with pain management  8. Smoker Smoking cessation encouraged  9. BMI 35.0-35.9,adult Discussed diet and exercise for person with BMI >25 Will recheck weight in 3-6 months   10. GAD (generalized anxiety disorder) - busPIRone (BUSPAR) 5 MG tablet; Take 1 tablet (5 mg total) by mouth 3 (three) times daily.  Dispense: 90 tablet; Refill: 5  11. Peripheral edema Elevate legs when sitting - furosemide (LASIX) 40 MG tablet; Take 1 tablet (40 mg total) by mouth daily.  Dispense: 90 tablet; Refill: 1  12. Cellulitis of right lower extremity Do not pick or scratch at lesons - cephALEXin (KEFLEX) 500 MG capsule; Take 1 capsule (500 mg total) by mouth 3 (three) times daily.  Dispense: 30 capsule; Refill: 0 - fluconazole (DIFLUCAN) 150 MG tablet; Take 1 tablet (150 mg total) by mouth once for 1 dose.  Dispense: 1 tablet; Refill: 0   Labs pending Health Maintenance reviewed Diet and exercise encouraged  Follow up plan: 6 months  [name], FNP

## 2020-08-16 LAB — CBC WITH DIFFERENTIAL/PLATELET
Basophils Absolute: 0.2 10*3/uL (ref 0.0–0.2)
Basos: 2 %
EOS (ABSOLUTE): 0.3 10*3/uL (ref 0.0–0.4)
Eos: 3 %
Hematocrit: 43.8 % (ref 34.0–46.6)
Hemoglobin: 14.7 g/dL (ref 11.1–15.9)
Immature Grans (Abs): 0.2 10*3/uL — ABNORMAL HIGH (ref 0.0–0.1)
Immature Granulocytes: 2 %
Lymphocytes Absolute: 3.3 10*3/uL — ABNORMAL HIGH (ref 0.7–3.1)
Lymphs: 34 %
MCH: 30.9 pg (ref 26.6–33.0)
MCHC: 33.6 g/dL (ref 31.5–35.7)
MCV: 92 fL (ref 79–97)
Monocytes Absolute: 0.7 10*3/uL (ref 0.1–0.9)
Monocytes: 7 %
Neutrophils Absolute: 5.1 10*3/uL (ref 1.4–7.0)
Neutrophils: 52 %
Platelets: 195 10*3/uL (ref 150–450)
RBC: 4.76 x10E6/uL (ref 3.77–5.28)
RDW: 12.9 % (ref 11.7–15.4)
WBC: 9.9 10*3/uL (ref 3.4–10.8)

## 2020-08-16 LAB — CMP14+EGFR
ALT: 23 IU/L (ref 0–32)
AST: 28 IU/L (ref 0–40)
Albumin/Globulin Ratio: 1.8 (ref 1.2–2.2)
Albumin: 4.3 g/dL (ref 3.8–4.8)
Alkaline Phosphatase: 94 IU/L (ref 44–121)
BUN/Creatinine Ratio: 13 (ref 12–28)
BUN: 19 mg/dL (ref 8–27)
Bilirubin Total: 0.4 mg/dL (ref 0.0–1.2)
CO2: 26 mmol/L (ref 20–29)
Calcium: 9.6 mg/dL (ref 8.7–10.3)
Chloride: 100 mmol/L (ref 96–106)
Creatinine, Ser: 1.48 mg/dL — ABNORMAL HIGH (ref 0.57–1.00)
Globulin, Total: 2.4 g/dL (ref 1.5–4.5)
Glucose: 82 mg/dL (ref 65–99)
Potassium: 4.6 mmol/L (ref 3.5–5.2)
Sodium: 140 mmol/L (ref 134–144)
Total Protein: 6.7 g/dL (ref 6.0–8.5)
eGFR: 40 mL/min/{1.73_m2} — ABNORMAL LOW (ref >59)

## 2020-08-16 LAB — LIPID PANEL
Chol/HDL Ratio: 2.8 ratio (ref 0.0–4.4)
Cholesterol, Total: 104 mg/dL (ref 100–199)
HDL: 37 mg/dL — ABNORMAL LOW (ref >39)
LDL Chol Calc (NIH): 47 mg/dL (ref 0–99)
Triglycerides: 108 mg/dL (ref 0–149)
VLDL Cholesterol Cal: 20 mg/dL (ref 5–40)

## 2020-09-05 ENCOUNTER — Ambulatory Visit
Admission: RE | Admit: 2020-09-05 | Discharge: 2020-09-05 | Disposition: A | Discharge status: Home / Self Care | Payer: BC Managed Care – PPO | Source: Ambulatory Visit | Attending: Nurse Practitioner | Admitting: Nurse Practitioner

## 2020-09-05 ENCOUNTER — Other Ambulatory Visit: Payer: Self-pay

## 2020-09-05 DIAGNOSIS — Z1231 Encounter for screening mammogram for malignant neoplasm of breast: Secondary | ICD-10-CM

## 2020-09-09 ENCOUNTER — Other Ambulatory Visit: Payer: Self-pay | Admitting: Nurse Practitioner

## 2020-09-09 DIAGNOSIS — R928 Other abnormal and inconclusive findings on diagnostic imaging of breast: Secondary | ICD-10-CM

## 2020-09-12 ENCOUNTER — Telehealth: Payer: Self-pay | Admitting: Nurse Practitioner

## 2020-09-12 NOTE — Telephone Encounter (Signed)
Called and reviewed results with patient. Advised that they were just ordering additional testing as a precautionary but patient is not sure if she wants to have done. She became emotional and began crying and stated that she wasn't sure if she wanted to have the testing done because she doesn't want to know if anything is wrong. She stated that she would think about it and let us know.

## 2020-09-22 ENCOUNTER — Encounter: Payer: Self-pay | Admitting: Family Medicine

## 2020-09-22 ENCOUNTER — Ambulatory Visit (INDEPENDENT_AMBULATORY_CARE_PROVIDER_SITE_OTHER): Payer: BC Managed Care – PPO | Admitting: Family Medicine

## 2020-09-22 DIAGNOSIS — U071 COVID-19: Secondary | ICD-10-CM | POA: Diagnosis not present

## 2020-09-22 MED ORDER — MOLNUPIRAVIR EUA 200MG CAPSULE: 4.0000 | ORAL_CAPSULE | Freq: Two times a day (BID) | ORAL | 0 refills | Status: AC

## 2020-09-22 MED ORDER — BENZONATATE 200 MG PO CAPS: 200.0000 mg | ORAL_CAPSULE | Freq: Three times a day (TID) | ORAL | 0 refills | Status: DC | PRN

## 2020-09-22 MED ORDER — DEXAMETHASONE 6 MG PO TABS: 6.0000 mg | ORAL_TABLET | Freq: Two times a day (BID) | ORAL | 0 refills | Status: DC

## 2020-09-22 NOTE — Progress Notes (Signed)
Subjective:    Patient ID: Stacie Solis, female    DOB: 1961/01/12, 60 y.o.   MRN: [id number]   HPI: Stacie Solis is a 60 y.o. female presenting for onset last night, up all night with headache, severe dry cough and body aches. Ears hurt. Eyes sore. Fever to 100.1. Denies dyspnea. No appetite.    Depression screen [facility] 2/9 08/15/2020 07/19/2020 07/19/2020 02/15/2020 05/28/2019  Decreased Interest 1 1 0 0 0  Down, Depressed, Hopeless 1 0 0 0 0  PHQ - 2 Score 2 1 0 0 0  Altered sleeping 3 0 - 3 -  Tired, decreased energy 3 0 - 3 -  Change in appetite 1 2 - 0 -  Feeling bad or failure about yourself  1 2 - 0 -  Trouble concentrating 1 2 - 0 -  Moving slowly or fidgety/restless 0 2 - 0 -  Suicidal thoughts 0 0 - 0 -  PHQ-9 Score 11 9 - 6 -  Difficult doing work/chores Somewhat difficult Somewhat difficult - Not difficult at all -  Some recent data might be hidden     Relevant past medical, surgical, family and social history reviewed and updated as indicated.  Interim medical history since our last visit reviewed. Allergies and medications reviewed and updated.  ROS:  Review of Systems  Constitutional:  Positive for activity change, appetite change, fatigue and fever.  HENT:  Positive for congestion and ear pain. Negative for ear discharge, hearing loss and nosebleeds.   Respiratory:  Negative for chest tightness and shortness of breath.   Cardiovascular:  Negative for chest pain.  Musculoskeletal:  Positive for myalgias.  Skin:  Negative for rash.  Neurological:  Positive for headaches.    Social History   Tobacco Use  Smoking Status Every Day   Packs/day: 2.00   Types: Cigarettes  Smokeless Tobacco Never  Tobacco Comments   Declined info       Objective:     Wt Readings from Last 3 Encounters:  08/15/20 181 lb (82.1 kg)  07/19/20 181 lb (82.1 kg)  02/15/20 172 lb (78 kg)     Exam deferred. Pt. Harboring due to COVID 19. Phone visit performed.   Assessment &  Plan:   1. COVID-19 virus infection     Meds ordered this encounter  Medications   molnupiravir EUA 200 mg CAPS    Sig: Take 4 capsules (800 mg total) by mouth 2 (two) times daily for 5 days.    Dispense:  40 capsule    Refill:  0   dexamethasone (DECADRON) 6 MG tablet    Sig: Take 1 tablet (6 mg total) by mouth 2 (two) times daily with a meal.    Dispense:  10 tablet    Refill:  0   benzonatate (TESSALON) 200 MG capsule    Sig: Take 1 capsule (200 mg total) by mouth 3 (three) times daily as needed for cough.    Dispense:  20 capsule    Refill:  0    No orders of the defined types were placed in this encounter.     Diagnoses and all orders for this visit:  COVID-19 virus infection  Other orders -     molnupiravir EUA 200 mg CAPS; Take 4 capsules (800 mg total) by mouth 2 (two) times daily for 5 days. -     dexamethasone (DECADRON) 6 MG tablet; Take 1 tablet (6 mg total) by mouth 2 (two) times  daily with a meal. -     benzonatate (TESSALON) 200 MG capsule; Take 1 capsule (200 mg total) by mouth 3 (three) times daily as needed for cough.   Virtual Visit via telephone Note  I discussed the limitations, risks, security and privacy concerns of performing an evaluation and management service by telephone and the availability of in person appointments. The patient was identified with two identifiers. Pt.expressed understanding and agreed to proceed. Pt. Is at home. Dr. Livia Snellen is in his office.  Follow Up Instructions:   I discussed the assessment and treatment plan with the patient. The patient was provided an opportunity to ask questions and all were answered. The patient agreed with the plan and demonstrated an understanding of the instructions.   The patient was advised to call back or seek an in-person evaluation if the symptoms worsen or if the condition fails to improve as anticipated.   Total minutes including chart review and phone contact time: 14   Follow up  plan: No follow-ups on file.  Claretta Fraise, MD [place]

## 2020-09-29 ENCOUNTER — Telehealth: Payer: Self-pay

## 2020-09-29 NOTE — Telephone Encounter (Signed)
Called to schedule mobile mmammo appt pt lmtcb

## 2020-10-20 DIAGNOSIS — M5416 Radiculopathy, lumbar region: Secondary | ICD-10-CM | POA: Diagnosis not present

## 2020-10-26 ENCOUNTER — Other Ambulatory Visit: Payer: Self-pay | Admitting: Nurse Practitioner

## 2020-10-26 ENCOUNTER — Ambulatory Visit
Admission: RE | Admit: 2020-10-26 | Discharge: 2020-10-26 | Disposition: A | Discharge status: Home / Self Care | Payer: BC Managed Care – PPO | Source: Ambulatory Visit | Attending: Nurse Practitioner | Admitting: Nurse Practitioner

## 2020-10-26 ENCOUNTER — Other Ambulatory Visit: Payer: Self-pay

## 2020-10-26 DIAGNOSIS — R921 Mammographic calcification found on diagnostic imaging of breast: Secondary | ICD-10-CM

## 2020-10-26 DIAGNOSIS — R928 Other abnormal and inconclusive findings on diagnostic imaging of breast: Secondary | ICD-10-CM

## 2020-10-26 DIAGNOSIS — R922 Inconclusive mammogram: Secondary | ICD-10-CM | POA: Diagnosis not present

## 2020-11-07 ENCOUNTER — Ambulatory Visit
Admission: RE | Admit: 2020-11-07 | Discharge: 2020-11-07 | Disposition: A | Discharge status: Home / Self Care | Payer: BC Managed Care – PPO | Source: Ambulatory Visit | Attending: Nurse Practitioner | Admitting: Nurse Practitioner

## 2020-11-07 ENCOUNTER — Other Ambulatory Visit: Payer: Self-pay

## 2020-11-07 DIAGNOSIS — R921 Mammographic calcification found on diagnostic imaging of breast: Secondary | ICD-10-CM

## 2020-11-07 DIAGNOSIS — N6489 Other specified disorders of breast: Secondary | ICD-10-CM | POA: Diagnosis not present

## 2020-11-07 HISTORY — PX: BREAST BIOPSY: SHX20

## 2020-11-28 DIAGNOSIS — M5416 Radiculopathy, lumbar region: Secondary | ICD-10-CM | POA: Diagnosis not present

## 2020-12-02 ENCOUNTER — Telehealth: Payer: Self-pay | Admitting: Nurse Practitioner

## 2020-12-02 DIAGNOSIS — K219 Gastro-esophageal reflux disease without esophagitis: Secondary | ICD-10-CM

## 2020-12-05 NOTE — Telephone Encounter (Signed)
PA in process/ww   Key: [id number] help? Call us at [phone] Status Additional Information Required Drug Omeprazole 40MG  dr capsules

## 2020-12-06 MED ORDER — PANTOPRAZOLE SODIUM 40 MG PO TBEC: 40.0000 mg | DELAYED_RELEASE_TABLET | Freq: Every day | ORAL | 0 refills | Status: DC

## 2020-12-06 NOTE — Telephone Encounter (Signed)
Send Nexium for the patient because apparently omeprazole was rejected by her insurance

## 2020-12-06 NOTE — Telephone Encounter (Signed)
Pantoprazole sent today by Dr. Warrick Parisian.

## 2020-12-06 NOTE — Telephone Encounter (Addendum)
PA denied per plan. They will not cover medication because it is available with the same or similar OTC medication. Patient would like [place] to change to a different medication?

## 2020-12-07 NOTE — Telephone Encounter (Signed)
Pa came in for Pantoprazole Sodium 40MG  dr tablets Key: [id number]  Approved today Effective from 12/07/2020 through 12/06/2021  [facility] aware

## 2021-01-09 ENCOUNTER — Encounter: Payer: Self-pay | Admitting: Nurse Practitioner

## 2021-01-09 ENCOUNTER — Ambulatory Visit (INDEPENDENT_AMBULATORY_CARE_PROVIDER_SITE_OTHER): Payer: BC Managed Care – PPO | Admitting: Nurse Practitioner

## 2021-01-09 ENCOUNTER — Telehealth: Payer: Self-pay | Admitting: Nurse Practitioner

## 2021-01-09 DIAGNOSIS — J029 Acute pharyngitis, unspecified: Secondary | ICD-10-CM | POA: Diagnosis not present

## 2021-01-09 MED ORDER — AMOXICILLIN-POT CLAVULANATE 875-125 MG PO TABS: 1.0000 | ORAL_TABLET | Freq: Two times a day (BID) | ORAL | 0 refills | Status: DC

## 2021-01-09 MED ORDER — BENZONATATE 100 MG PO CAPS: 100.0000 mg | ORAL_CAPSULE | Freq: Three times a day (TID) | ORAL | 0 refills | Status: DC | PRN

## 2021-01-09 NOTE — Telephone Encounter (Signed)
Visit with Je today. Please advise/

## 2021-01-09 NOTE — Assessment & Plan Note (Signed)
Take meds as prescribed - Use a cool mist humidifier  -Use saline nose sprays frequently -Force fluids -For fever or aches or pains- take Tylenol or ibuprofen. -Augmentin 875-125 mg tablet by mouth -Benzonatate 100 mg tablet by mouth -If symptoms do not improve, she may need to be COVID tested to rule this out Follow up with worsening unresolved symptoms

## 2021-01-09 NOTE — Progress Notes (Signed)
   Virtual Visit  Note Due to COVID-19 pandemic this visit was conducted virtually. This visit type was conducted due to national recommendations for restrictions regarding the COVID-19 Pandemic (e.g. social distancing, sheltering in place) in an effort to limit this patient's exposure and mitigate transmission in our community. All issues noted in this document were discussed and addressed.  A physical exam was not performed with this format.  I connected with Stacie Solis on 01/09/21 at 1:40 PM by telephone and verified that I am speaking with the correct person using two identifiers. Stacie Solis is currently located at home during visit. The provider, Ivy Lynn, NP is located in their office at time of visit.  I discussed the limitations, risks, security and privacy concerns of performing an evaluation and management service by telephone and the availability of in person appointments. I also discussed with the patient that there may be a patient responsible charge related to this service. The patient expressed understanding and agreed to proceed.   History and Present Illness:  Sore Throat  This is a new problem. The problem has been gradually worsening. The pain is worse on the right side. The maximum temperature recorded prior to her arrival was 100.4 - 100.9 F. The pain is at a severity of 6/10. The pain is moderate. Associated symptoms include congestion, coughing, ear pain and headaches. Pertinent negatives include no vomiting. She has had no exposure to strep.     Review of Systems  Constitutional:  Positive for chills and fever.  HENT:  Positive for congestion, ear pain, sinus pain and sore throat.   Respiratory:  Positive for cough.   Gastrointestinal:  Negative for nausea and vomiting.  Neurological:  Positive for headaches.  All other systems reviewed and are negative.   Observations/Objective: Televisit patient not in distress  Assessment and Plan: Take meds as  prescribed - Use a cool mist humidifier  -Use saline nose sprays frequently -Force fluids -For fever or aches or pains- take Tylenol or ibuprofen. -Augmentin 875-125 mg tablet by mouth -Benzonatate 100 mg tablet by mouth -If symptoms do not improve, she may need to be COVID tested to rule this out Follow up with worsening unresolved symptoms  Follow Up Instructions: Follow-up with worsening unresolved symptoms    I discussed the assessment and treatment plan with the patient. The patient was provided an opportunity to ask questions and all were answered. The patient agreed with the plan and demonstrated an understanding of the instructions.   The patient was advised to call back or seek an in-person evaluation if the symptoms worsen or if the condition fails to improve as anticipated.  The above assessment and management plan was discussed with the patient. The patient verbalized understanding of and has agreed to the management plan. Patient is aware to call the clinic if symptoms persist or worsen. Patient is aware when to return to the clinic for a follow-up visit. Patient educated on when it is appropriate to go to the emergency department.   Time call ended: 1:50 PM  I provided 10 minutes of  non face-to-face time during this encounter.    Ivy Lynn, NP

## 2021-01-09 NOTE — Telephone Encounter (Signed)
Aware. 

## 2021-01-23 DIAGNOSIS — M62830 Muscle spasm of back: Secondary | ICD-10-CM | POA: Diagnosis not present

## 2021-02-13 ENCOUNTER — Ambulatory Visit: Payer: Self-pay | Admitting: Nurse Practitioner

## 2021-02-14 ENCOUNTER — Encounter: Payer: Self-pay | Admitting: Nurse Practitioner

## 2021-02-28 ENCOUNTER — Ambulatory Visit (INDEPENDENT_AMBULATORY_CARE_PROVIDER_SITE_OTHER): Payer: BC Managed Care – PPO | Admitting: Nurse Practitioner

## 2021-02-28 ENCOUNTER — Encounter: Payer: Self-pay | Admitting: Nurse Practitioner

## 2021-02-28 VITALS — BP 111/78 | HR 82 | Temp 96.8°F | Resp 20 | Ht 62.0 in | Wt 172.0 lb

## 2021-02-28 DIAGNOSIS — K219 Gastro-esophageal reflux disease without esophagitis: Secondary | ICD-10-CM | POA: Diagnosis not present

## 2021-02-28 DIAGNOSIS — F410 Panic disorder [episodic paroxysmal anxiety] without agoraphobia: Secondary | ICD-10-CM

## 2021-02-28 DIAGNOSIS — R600 Localized edema: Secondary | ICD-10-CM

## 2021-02-28 DIAGNOSIS — F5101 Primary insomnia: Secondary | ICD-10-CM

## 2021-02-28 DIAGNOSIS — Z23 Encounter for immunization: Secondary | ICD-10-CM

## 2021-02-28 DIAGNOSIS — F3342 Major depressive disorder, recurrent, in full remission: Secondary | ICD-10-CM

## 2021-02-28 DIAGNOSIS — Z6835 Body mass index (BMI) 35.0-35.9, adult: Secondary | ICD-10-CM

## 2021-02-28 DIAGNOSIS — G500 Trigeminal neuralgia: Secondary | ICD-10-CM

## 2021-02-28 DIAGNOSIS — F17200 Nicotine dependence, unspecified, uncomplicated: Secondary | ICD-10-CM

## 2021-02-28 DIAGNOSIS — F411 Generalized anxiety disorder: Secondary | ICD-10-CM

## 2021-02-28 DIAGNOSIS — E782 Mixed hyperlipidemia: Secondary | ICD-10-CM | POA: Diagnosis not present

## 2021-02-28 DIAGNOSIS — G2581 Restless legs syndrome: Secondary | ICD-10-CM

## 2021-02-28 DIAGNOSIS — M545 Low back pain, unspecified: Secondary | ICD-10-CM

## 2021-02-28 DIAGNOSIS — R609 Edema, unspecified: Secondary | ICD-10-CM

## 2021-02-28 DIAGNOSIS — G8929 Other chronic pain: Secondary | ICD-10-CM

## 2021-02-28 MED ORDER — MAGIC MOUTHWASH: 5.0000 mL | Freq: Every day | ORAL | 1 refills | Status: DC

## 2021-02-28 MED ORDER — RABEPRAZOLE SODIUM 20 MG PO TBEC: 20.0000 mg | DELAYED_RELEASE_TABLET | Freq: Every day | ORAL | 1 refills | Status: DC

## 2021-02-28 MED ORDER — ATORVASTATIN CALCIUM 40 MG PO TABS: 40.0000 mg | ORAL_TABLET | Freq: Every day | ORAL | 1 refills | Status: DC

## 2021-02-28 MED ORDER — FUROSEMIDE 40 MG PO TABS: 40.0000 mg | ORAL_TABLET | Freq: Every day | ORAL | 1 refills | Status: DC

## 2021-02-28 MED ORDER — FENOFIBRATE 134 MG PO CAPS: 134.0000 mg | ORAL_CAPSULE | Freq: Every day | ORAL | 11 refills | Status: DC

## 2021-02-28 MED ORDER — ESCITALOPRAM OXALATE 20 MG PO TABS: 20.0000 mg | ORAL_TABLET | Freq: Every day | ORAL | 1 refills | Status: DC

## 2021-02-28 MED ORDER — ALBUTEROL SULFATE HFA 108 (90 BASE) MCG/ACT IN AERS: INHALATION_SPRAY | RESPIRATORY_TRACT | 3 refills | Status: DC

## 2021-02-28 MED ORDER — BUPROPION HCL ER (XL) 150 MG PO TB24: 150.0000 mg | ORAL_TABLET | Freq: Every day | ORAL | 1 refills | Status: DC

## 2021-02-28 MED ORDER — BUSPIRONE HCL 5 MG PO TABS: 5.0000 mg | ORAL_TABLET | Freq: Three times a day (TID) | ORAL | 5 refills | Status: DC

## 2021-02-28 NOTE — Progress Notes (Signed)
Subjective:    Patient ID: Stacie Solis, female    DOB: January 04, 1961, 61 y.o.   MRN: [id number]  Chief Complaint: medical management of chronic issues     HPI:  Stacie Solis is a 61 y.o. who identifies as a female who was assigned female at birth.   Social history: Lives with: husband Work history: disability   Comes in today for follow up of the following chronic medical issues:  1. Mixed hyperlipidemia Does not watch diet and does very little dedicated exercise. Lab Results  Component Value Date   CHOL 104 08/15/2020   HDL 37 (L) 08/15/2020   LDLCALC 47 08/15/2020   TRIG 108 08/15/2020   CHOLHDL 2.8 08/15/2020   The ASCVD Risk score (Arnett DK, et al., 2019) failed to calculate for the following reasons:   The valid total cholesterol range is 130 to 320 mg/dL   2. Gastroesophageal reflux disease without esophagitis Is on protonix daily but is not working. She was on omeprazole and her insurance will not cover meds.   3. Trigeminal neuralgia Has occasional pain but for the most part is good.  4. Chronic midline low back pain without sciatica Uses her flexeril as needed  5. Recurrent major depressive disorder, in full remission ([place]) Is on combination of lexapro and wellbutrin. Depression screen [facility] 2/9 02/28/2021 08/15/2020 07/19/2020  Decreased Interest '1 1 1  ' Down, Depressed, Hopeless 0 1 0  PHQ - 2 Score '1 2 1  ' Altered sleeping 1 3 0  Tired, decreased energy 2 3 0  Change in appetite 0 1 2  Feeling bad or failure about yourself  0 1 2  Trouble concentrating 0 1 2  Moving slowly or fidgety/restless 1 0 2  Suicidal thoughts 0 0 0  PHQ-9 Score '5 11 9  ' Difficult doing work/chores Somewhat difficult Somewhat difficult Somewhat difficult  Some recent data might be hidden     6. Panic attacks Takes buspar as needed.  7. Primary insomnia Is on ambien to sleep at night. Sleeps about 8 hours a night. She says she doe snot take ambien every night.  8. RLS  (restless legs syndrome) Is on mirapex dialy and is doing well. Legs do not bother her at night.  9. Smoker Smokes about 1 pack a day. Has not had low dose CT scan.  10. BMI 35.0-35.9,adult No recent weight changes Wt Readings from Last 3 Encounters:  02/28/21 172 lb (78 kg)  08/15/20 181 lb (82.1 kg)  07/19/20 181 lb (82.1 kg)   BMI Readings from Last 3 Encounters:  02/28/21 31.46 kg/m  08/15/20 33.11 kg/m  07/19/20 33.11 kg/m     New complaints: None today  No Known Allergies Outpatient Encounter Medications as of 02/28/2021  Medication Sig   albuterol (PROVENTIL HFA;VENTOLIN HFA) 108 (90 Base) MCG/ACT inhaler USE 2 PUFFS EVERY 4 HOURS AS NEEDED   atorvastatin (LIPITOR) 40 MG tablet Take 1 tablet (40 mg total) by mouth daily.   buPROPion (WELLBUTRIN XL) 150 MG 24 hr tablet Take 1 tablet (150 mg total) by mouth daily.   busPIRone (BUSPAR) 5 MG tablet Take 1 tablet (5 mg total) by mouth 3 (three) times daily.   cholecalciferol (VITAMIN D) 1000 UNITS tablet Take 1,000 Units by mouth daily.   cyclobenzaprine (FLEXERIL) 10 MG tablet TAKE 1 TABLET BY MOUTH EVERYDAY AT BEDTIME   escitalopram (LEXAPRO) 20 MG tablet Take 1 tablet (20 mg total) by mouth daily.   fenofibrate micronized (LOFIBRA)  134 MG capsule Take 1 capsule (134 mg total) by mouth daily before breakfast.   fish oil-omega-3 fatty acids 1000 MG capsule Take 2 g by mouth 2 (two) times daily.   furosemide (LASIX) 40 MG tablet Take 1 tablet (40 mg total) by mouth daily.   magic mouthwash SOLN Take 5 mLs by mouth daily.   ondansetron (ZOFRAN) 4 MG tablet TAKE 1 TABLET BY MOUTH EVERY 8 HOURS AS NEEDED FOR NAUSEA AND VOMITING   pantoprazole (PROTONIX) 40 MG tablet Take 1 tablet (40 mg total) by mouth daily.   pramipexole (MIRAPEX) 0.5 MG tablet Take 1 tablet (0.5 mg total) by mouth 3 (three) times daily.   sodium chloride (OCEAN) 0.65 % SOLN nasal spray Place 1 spray into both nostrils as needed for congestion.    triamcinolone (NASACORT) 55 MCG/ACT AERO nasal inhaler PLACE 2 SPRAYS INTO THE NOSE DAILY.   zolpidem (AMBIEN) 10 MG tablet Take 1 tablet (10 mg total) by mouth at bedtime as needed for sleep.   No facility-administered encounter medications on file as of 02/28/2021.    Past Surgical History:  Procedure Laterality Date   BRAIN SURGERY     trig neuralgia   CHOLECYSTECTOMY      Family History  Problem Relation Age of Onset   Hypertension Mother    Diabetes Mother    Arthritis Mother    Lung cancer Maternal Grandfather       Controlled substance contract: 02/29/20     Review of Systems  Constitutional:  Negative for diaphoresis.  Eyes:  Negative for pain.  Respiratory:  Negative for shortness of breath.   Cardiovascular:  Negative for chest pain, palpitations and leg swelling.  Gastrointestinal:  Negative for abdominal pain.  Endocrine: Negative for polydipsia.  Skin:  Negative for rash.  Neurological:  Negative for dizziness, weakness and headaches.  Hematological:  Does not bruise/bleed easily.  All other systems reviewed and are negative.     Objective:   Physical Exam Vitals and nursing note reviewed.  Constitutional:      General: She is not in acute distress.    Appearance: Normal appearance. She is well-developed.  HENT:     Head: Normocephalic.     Right Ear: Tympanic membrane normal.     Left Ear: Tympanic membrane normal.     Nose: Nose normal.     Mouth/Throat:     Mouth: Mucous membranes are moist.  Eyes:     Pupils: Pupils are equal, round, and reactive to light.  Neck:     Vascular: No carotid bruit or JVD.  Cardiovascular:     Rate and Rhythm: Normal rate and regular rhythm.     Heart sounds: Normal heart sounds.  Pulmonary:     Effort: Pulmonary effort is normal. No respiratory distress.     Breath sounds: Normal breath sounds. No wheezing or rales.  Chest:     Chest wall: No tenderness.  Abdominal:     General: Bowel sounds are normal.  There is no distension or abdominal bruit.     Palpations: Abdomen is soft. There is no hepatomegaly, splenomegaly, mass or pulsatile mass.     Tenderness: There is no abdominal tenderness.  Musculoskeletal:        General: Normal range of motion.     Cervical back: Normal range of motion and neck supple.     Right lower leg: Edema (1+) present.     Left lower leg: Edema (1+) present.  Lymphadenopathy:  Cervical: No cervical adenopathy.  Skin:    General: Skin is warm and dry.     Comments: Darkened lower ext bil  Neurological:     Mental Status: She is alert and oriented to person, place, and time.     Deep Tendon Reflexes: Reflexes are normal and symmetric.  Psychiatric:        Behavior: Behavior normal.        Thought Content: Thought content normal.        Judgment: Judgment normal.   BP 111/78    Pulse 82    Temp (!) 96.8 F (36 C) (Temporal)    Resp 20    Ht '5\' 2"'  (1.575 m)    Wt 172 lb (78 kg)    SpO2 97%    BMI 31.46 kg/m         Assessment & Plan:  AYZA RIPOLL comes in today with chief complaint of Medical Management of Chronic Issues   Diagnosis and orders addressed:  1. Mixed hyperlipidemia Low fat diet - fenofibrate micronized (LOFIBRA) 134 MG capsule; Take 1 capsule (134 mg total) by mouth daily before breakfast.  Dispense: 30 capsule; Refill: 11 - atorvastatin (LIPITOR) 40 MG tablet; Take 1 tablet (40 mg total) by mouth daily.  Dispense: 90 tablet; Refill: 1 - CBC with Differential/Platelet - CMP14+EGFR - Lipid panel  2. Gastroesophageal reflux disease without esophagitis Avoid spicy foods Do not eat 2 hours prior to bedtime  - RABEprazole (ACIPHEX) 20 MG tablet; Take 1 tablet (20 mg total) by mouth daily.  Dispense: 90 tablet; Refill: 1  3. Trigeminal neuralgia Report any pain  4. Chronic midline low back pain without sciatica Moist heat rest  5. Recurrent major depressive disorder, in full remission ([place]) Stress management - escitalopram  (LEXAPRO) 20 MG tablet; Take 1 tablet (20 mg total) by mouth daily.  Dispense: 90 tablet; Refill: 1 - buPROPion (WELLBUTRIN XL) 150 MG 24 hr tablet; Take 1 tablet (150 mg total) by mouth daily.  Dispense: 90 tablet; Refill: 1  6. Panic attacks  7. Primary insomnia Bedtime routine Ambien as needed  8. RLS (restless legs syndrome) Keep legs warm at night  9. Smoker Smoking cessation encouraged - CT CHEST LUNG CA SCREEN LOW DOSE W/O CM; Future  10. BMI 35.0-35.9,adult Discussed diet and exercise for person with BMI >25 Will recheck weight in 3-6 months   11. GAD (generalized anxiety disorder) - busPIRone (BUSPAR) 5 MG tablet; Take 1 tablet (5 mg total) by mouth 3 (three) times daily.  Dispense: 90 tablet; Refill: 5  12. Peripheral edema Elevate legs when sitting - furosemide (LASIX) 40 MG tablet; Take 1 tablet (40 mg total) by mouth daily.  Dispense: 90 tablet; Refill: 1   Labs pending Health Maintenance reviewed Diet and exercise encouraged  Follow up plan: 6 months   [name], FNP

## 2021-02-28 NOTE — Addendum Note (Signed)
Addended by: Rolena Infante on: 02/28/2021 12:51 PM   Modules accepted: Orders

## 2021-02-28 NOTE — Patient Instructions (Signed)
Managing the Challenge of Quitting Smoking ?Quitting smoking is a physical and mental challenge. You will face cravings, withdrawal symptoms, and temptation. Before quitting, work with your health care provider to make a plan that can help you manage quitting. Preparation can help you quit and keep you from giving in. ?How to manage lifestyle changes ?Managing stress ?Stress can make you want to smoke, and wanting to smoke may cause stress. It is important to find ways to manage your stress. You might try some of the following: ?Practice relaxation techniques. ?Breathe slowly and deeply, in through your nose and out through your mouth. ?Listen to music. ?Soak in a bath or take a shower. ?Imagine a peaceful place or vacation. ?Get some support. ?Talk with family or friends about your stress. ?Join a support group. ?Talk with a counselor or therapist. ?Get some physical activity. ?Go for a walk, run, or bike ride. ?Play a favorite sport. ?Practice yoga. ? ?Medicines ?Talk with your health care provider about medicines that might help you deal with cravings and make quitting easier for you. ?Relationships ?Social situations can be difficult when you are quitting smoking. To manage this, you can: ?Avoid parties and other social situations where people might be smoking. ?Avoid alcohol. ?Leave right away if you have the urge to smoke. ?Explain to your family and friends that you are quitting smoking. Ask for support and let them know you might be a bit grumpy. ?Plan activities where smoking is not an option. ?General instructions ?Be aware that many people gain weight after they quit smoking. However, not everyone does. To keep from gaining weight, have a plan in place before you quit and stick to the plan after you quit. Your plan should include: ?Having healthy snacks. When you have a craving, it may help to: ?Eat popcorn, carrots, celery, or other cut vegetables. ?Chew sugar-free gum. ?Changing how you eat. ?Eat small  portion sizes at meals. ?Eat 4-6 small meals throughout the day instead of 1-2 large meals a day. ?Be mindful when you eat. Do not watch television or do other things that might distract you as you eat. ?Exercising regularly. ?Make time to exercise each day. If you do not have time for a long workout, do short bouts of exercise for 5-10 minutes several times a day. ?Do some form of strengthening exercise, such as weight lifting. ?Do some exercise that gets your heart beating and causes you to breathe deeply, such as walking fast, running, swimming, or biking. This is very important. ?Drinking plenty of water or other low-calorie or no-calorie drinks. Drink 6-8 glasses of water daily. ? ?How to recognize withdrawal symptoms ?Your body and mind may experience discomfort as you try to get used to not having nicotine in your system. These effects are called withdrawal symptoms. They may include: ?Feeling hungrier than normal. ?Having trouble concentrating. ?Feeling irritable or restless. ?Having trouble sleeping. ?Feeling depressed. ?Craving a cigarette. ?To manage withdrawal symptoms: ?Avoid places, people, and activities that trigger your cravings. ?Remember why you want to quit. ?Get plenty of sleep. ?Avoid coffee and other caffeinated drinks. These may worsen some of your symptoms. ?These symptoms may surprise you. But be assured that they are normal to have when quitting smoking. ?How to manage cravings ?Come up with a plan for how to deal with your cravings. The plan should include the following: ?A definition of the specific situation you want to deal with. ?An alternative action you will take. ?A clear idea for how this action   will help. ?The name of someone who might help you with this. ?Cravings usually last for 5-10 minutes. Consider taking the following actions to help you with your plan to deal with cravings: ?Keep your mouth busy. ?Chew sugar-free gum. ?Suck on hard candies or a straw. ?Brush your  teeth. ?Keep your hands and body busy. ?Change to a different activity right away. ?Squeeze or play with a ball. ?Do an activity or a hobby, such as making bead jewelry, practicing needlepoint, or working with wood. ?Mix up your normal routine. ?Take a short exercise break. Go for a quick walk or run up and down stairs. ?Focus on doing something kind or helpful for someone else. ?Call a friend or family member to talk during a craving. ?Join a support group. ?Contact a quitline. ?Where to find support ?To get help or find a support group: ?Call the National Cancer Institute's Smoking Quitline: 1-800-QUIT NOW (784-8669) ?Visit the website of the Substance Abuse and Mental Health Services Administration: www.samhsa.gov ?Text QUIT to SmokefreeTXT: 478848 ?Where to find more information ?Visit these websites to find more information on quitting smoking: ?National Cancer Institute: www.smokefree.gov ?American Lung Association: www.lung.org ?American Cancer Society: www.cancer.org ?Centers for Disease Control and Prevention: www.cdc.gov ?American Heart Association: www.heart.org ?Contact a health care provider if: ?You want to change your plan for quitting. ?The medicines you are taking are not helping. ?Your eating feels out of control or you cannot sleep. ?Get help right away if: ?You feel depressed or become very anxious. ?Summary ?Quitting smoking is a physical and mental challenge. You will face cravings, withdrawal symptoms, and temptation to smoke again. Preparation can help you as you go through these challenges. ?Try different techniques to manage stress, handle social situations, and prevent weight gain. ?You can deal with cravings by keeping your mouth busy (such as by chewing gum), keeping your hands and body busy, calling family or friends, or contacting a quitline for people who want to quit smoking. ?You can deal with withdrawal symptoms by avoiding places where people smoke, getting plenty of rest, and  avoiding drinks with caffeine. ?This information is not intended to replace advice given to you by your health care provider. Make sure you discuss any questions you have with your health care provider. ?Document Revised: 10/21/2020 Document Reviewed: 12/02/2018 ?Elsevier Patient Education ? 2022 Elsevier Inc. ? ?

## 2021-03-01 ENCOUNTER — Other Ambulatory Visit: Payer: Self-pay | Admitting: Family Medicine

## 2021-03-01 ENCOUNTER — Telehealth: Payer: Self-pay | Admitting: Nurse Practitioner

## 2021-03-01 LAB — CBC WITH DIFFERENTIAL/PLATELET
Basophils Absolute: 0.2 10*3/uL (ref 0.0–0.2)
Basos: 2 %
EOS (ABSOLUTE): 0.2 10*3/uL (ref 0.0–0.4)
Eos: 2 %
Hematocrit: 43.8 % (ref 34.0–46.6)
Hemoglobin: 14.3 g/dL (ref 11.1–15.9)
Immature Grans (Abs): 0.2 10*3/uL — ABNORMAL HIGH (ref 0.0–0.1)
Immature Granulocytes: 2 %
Lymphocytes Absolute: 2.5 10*3/uL (ref 0.7–3.1)
Lymphs: 28 %
MCH: 30 pg (ref 26.6–33.0)
MCHC: 32.6 g/dL (ref 31.5–35.7)
MCV: 92 fL (ref 79–97)
Monocytes Absolute: 0.6 10*3/uL (ref 0.1–0.9)
Monocytes: 7 %
Neutrophils Absolute: 5.5 10*3/uL (ref 1.4–7.0)
Neutrophils: 59 %
Platelets: 265 10*3/uL (ref 150–450)
RBC: 4.77 x10E6/uL (ref 3.77–5.28)
RDW: 12.4 % (ref 11.7–15.4)
WBC: 9.1 10*3/uL (ref 3.4–10.8)

## 2021-03-01 LAB — CMP14+EGFR
ALT: 22 IU/L (ref 0–32)
AST: 26 IU/L (ref 0–40)
Albumin/Globulin Ratio: 1.6 (ref 1.2–2.2)
Albumin: 4.3 g/dL (ref 3.8–4.8)
Alkaline Phosphatase: 79 IU/L (ref 44–121)
BUN/Creatinine Ratio: 12 (ref 12–28)
BUN: 17 mg/dL (ref 8–27)
Bilirubin Total: 0.4 mg/dL (ref 0.0–1.2)
CO2: 25 mmol/L (ref 20–29)
Calcium: 9.4 mg/dL (ref 8.7–10.3)
Chloride: 106 mmol/L (ref 96–106)
Creatinine, Ser: 1.4 mg/dL — ABNORMAL HIGH (ref 0.57–1.00)
Globulin, Total: 2.7 g/dL (ref 1.5–4.5)
Glucose: 104 mg/dL — ABNORMAL HIGH (ref 70–99)
Potassium: 5.1 mmol/L (ref 3.5–5.2)
Sodium: 143 mmol/L (ref 134–144)
Total Protein: 7 g/dL (ref 6.0–8.5)
eGFR: 43 mL/min/{1.73_m2} — ABNORMAL LOW (ref >59)

## 2021-03-01 LAB — LIPID PANEL
Chol/HDL Ratio: 3.5 ratio (ref 0.0–4.4)
Cholesterol, Total: 117 mg/dL (ref 100–199)
HDL: 33 mg/dL — ABNORMAL LOW (ref >39)
LDL Chol Calc (NIH): 59 mg/dL (ref 0–99)
Triglycerides: 140 mg/dL (ref 0–149)
VLDL Cholesterol Cal: 25 mg/dL (ref 5–40)

## 2021-03-01 NOTE — Telephone Encounter (Signed)
KeyJaney Solis - Rx #: [id number] Need help? Call us at [phone] Outcome Approvedtoday Effective from 03/01/2021 through 02/28/2022. Drug RABEprazole Sodium 20MG  dr tablets Form [employer] Electronic Request Form (CB)   Pharmacy aware

## 2021-03-30 ENCOUNTER — Telehealth: Payer: Self-pay | Admitting: Nurse Practitioner

## 2021-03-30 DIAGNOSIS — M5459 Other low back pain: Secondary | ICD-10-CM | POA: Diagnosis not present

## 2021-03-30 DIAGNOSIS — G894 Chronic pain syndrome: Secondary | ICD-10-CM | POA: Diagnosis not present

## 2021-03-30 DIAGNOSIS — Z79891 Long term (current) use of opiate analgesic: Secondary | ICD-10-CM | POA: Diagnosis not present

## 2021-03-30 DIAGNOSIS — Z5181 Encounter for therapeutic drug level monitoring: Secondary | ICD-10-CM | POA: Diagnosis not present

## 2021-03-30 DIAGNOSIS — M5136 Other intervertebral disc degeneration, lumbar region: Secondary | ICD-10-CM | POA: Diagnosis not present

## 2021-03-30 DIAGNOSIS — Z79899 Other long term (current) drug therapy: Secondary | ICD-10-CM | POA: Diagnosis not present

## 2021-03-31 NOTE — Telephone Encounter (Signed)
We did not order CT can of abdomen. We ordered low dose CT scan of lungs because you are a smoker.  Please check on this and find out what delay is on scheduling

## 2021-04-04 ENCOUNTER — Other Ambulatory Visit: Payer: Self-pay | Admitting: *Deleted

## 2021-04-04 DIAGNOSIS — F17210 Nicotine dependence, cigarettes, uncomplicated: Secondary | ICD-10-CM

## 2021-04-04 DIAGNOSIS — Z87891 Personal history of nicotine dependence: Secondary | ICD-10-CM

## 2021-04-12 ENCOUNTER — Encounter: Payer: Self-pay | Admitting: Acute Care

## 2021-04-12 ENCOUNTER — Ambulatory Visit (INDEPENDENT_AMBULATORY_CARE_PROVIDER_SITE_OTHER): Payer: BC Managed Care – PPO | Admitting: Acute Care

## 2021-04-12 ENCOUNTER — Other Ambulatory Visit: Payer: Self-pay

## 2021-04-12 DIAGNOSIS — F17210 Nicotine dependence, cigarettes, uncomplicated: Secondary | ICD-10-CM

## 2021-04-12 NOTE — Progress Notes (Addendum)
Virtual Visit via Telephone Note  I connected with Stacie Solis on 04/14/21 at 10:30 AM EST by telephone and verified that I am speaking with the correct person using two identifiers.  Location: Patient:  At home Provider: [place], [place], [place], [address]    I discussed the limitations, risks, security and privacy concerns of performing an evaluation and management service by telephone and the availability of in person appointments. I also discussed with the patient that there may be a patient responsible charge related to this service. The patient expressed understanding and agreed to proceed.   Shared Decision Making Visit Lung Cancer Screening Program [phone])   Eligibility: Age 61 y.o. Pack Years Smoking History Calculation 84 pack year smoking history (# packs/per year x # years smoked) Recent History of coughing up blood  no Unexplained weight loss? no ( >Than 15 pounds within the last 6 months ) Prior History Lung / other cancer no (Diagnosis within the last 5 years already requiring surveillance chest CT Scans). Smoking Status Current Smoker Former Smokers: Years since quit:  NA  Quit Date:  NA  Visit Components: Discussion included one or more decision making aids. yes Discussion included risk/benefits of screening. yes Discussion included potential follow up diagnostic testing for abnormal scans. yes Discussion included meaning and risk of over diagnosis. yes Discussion included meaning and risk of False Positives. yes Discussion included meaning of total radiation exposure. yes  Counseling Included: Importance of adherence to annual lung cancer LDCT screening. yes Impact of comorbidities on ability to participate in the program. yes Ability and willingness to under diagnostic treatment. yes  Smoking Cessation Counseling: Current Smokers:  Discussed importance of smoking cessation. yes Information about tobacco cessation classes and interventions  provided to patient. yes Patient provided with "ticket" for LDCT Scan. yes Symptomatic Patient. no  Counseling NA Diagnosis Code: Tobacco Use Z72.0 Asymptomatic Patient yes  Counseling (Intermediate counseling: > three minutes counseling) W1027 Former Smokers:  Discussed the importance of maintaining cigarette abstinence. yes Diagnosis Code: Personal History of Nicotine Dependence. O53.664 Information about tobacco cessation classes and interventions provided to patient. Yes Patient provided with "ticket" for LDCT Scan. yes Written Order for Lung Cancer Screening with LDCT placed in Epic. Yes (CT Chest Lung Cancer Screening Low Dose W/O CM) [id number] Z12.2-Screening of respiratory organs Z87.891-Personal history of nicotine dependence  I have spent 25 minutes of face to face/ virtual visit   time with  Stacie Solis discussing the risks and benefits of lung cancer screening. We viewed / discussed a power point together that explained in detail the above noted topics. We paused at intervals to allow for questions to be asked and answered to ensure understanding.We discussed that the single most powerful action that she can take to decrease her risk of developing lung cancer is to quit smoking. We discussed whether or not she is ready to commit to setting a quit date. We discussed options for tools to aid in quitting smoking including nicotine replacement therapy, non-nicotine medications, support groups, Quit Smart classes, and behavior modification. We discussed that often times setting smaller, more achievable goals, such as eliminating 1 cigarette a day for a week and then 2 cigarettes a day for a week can be helpful in slowly decreasing the number of cigarettes smoked. This allows for a sense of accomplishment as well as providing a clinical benefit. I provided  her  with smoking cessation  information  with contact information for community resources, classes, free  nicotine replacement therapy, and  access to mobile apps, text messaging, and on-line smoking cessation help. I have also provided  her  the office contact information in the event she  needs to contact me, or the screening staff. We discussed the time and location of the scan, and that either Doroteo Glassman RN, Joella Prince, RN  or I will call / send a letter with the results within 24-72 hours of receiving them. The patient verbalized understanding of all of  the above and had no further questions upon leaving the office. They have my contact information in the event they have any further questions.  I spent 3 minutes counseling on smoking cessation and the health risks of continued tobacco abuse.  I explained to the patient that there has been a high incidence of coronary artery disease noted on these exams. I explained that this is a non-gated exam therefore degree or severity cannot be determined. This patient is on statin therapy. I have asked the patient to follow-up with their PCP regarding any incidental finding of coronary artery disease and management with diet or medication as their PCP  feels is clinically indicated. The patient verbalized understanding of the above and had no further questions upon completion of the visit.      Magdalen Spatz, NP 04/12/2021

## 2021-04-12 NOTE — Patient Instructions (Signed)
Thank you for participating in the [facility]. °It was our pleasure to meet you today. °We will call you with the results of your scan within the next few days. °Your scan will be assigned a Lung RADS category score by the physicians reading the scans.  °This Lung RADS score determines follow up scanning.  °See below for description of categories, and follow up screening recommendations. °We will be in touch to schedule your follow up screening annually or based on recommendations of our providers. °We will fax a copy of your scan results to your Primary Care Physician, or the physician who referred you to the program, to ensure they have the results. °Please call the office if you have any questions or concerns regarding your scanning experience or results.  °Our office number is [phone]. °Please speak with Denise Phelps, RN. She is our Lung Cancer Screening RN. °If she is unavailable when you call, please have the office staff send her a message. She will return your call at her earliest convenience. °Remember, if your scan is normal, we will scan you annually as long as you continue to meet the criteria for the program. (Age 55-77, Current smoker or smoker who has quit within the last 15 years). °If you are a smoker, remember, quitting is the single most powerful action that you can take to decrease your risk of lung cancer and other pulmonary, breathing related problems. °We know quitting is hard, and we are here to help.  °Please let us know if there is anything we can do to help you meet your goal of quitting. °If you are a former smoker, congratulations. We are proud of you! Remain smoke free! °Remember you can refer friends or family members through the number above.  °We will screen them to make sure they meet criteria for the program. °Thank you for helping us take better care of you by participating in Lung Screening. ° °You can receive free nicotine replacement therapy  ( patches, gum or mints) by calling 1-800-QUIT NOW. Please call so we can get you on the path to becoming  a non-smoker. I know it is hard, but you can do this! ° °Lung RADS Categories: ° °Lung RADS 1: no nodules or definitely non-concerning nodules.  °Recommendation is for a repeat annual scan in 12 months. ° °Lung RADS 2:  nodules that are non-concerning in appearance and behavior with a very low likelihood of becoming an active cancer. °Recommendation is for a repeat annual scan in 12 months. ° °Lung RADS 3: nodules that are probably non-concerning , includes nodules with a low likelihood of becoming an active cancer.  Recommendation is for a 6-month repeat screening scan. Often noted after an upper respiratory illness. We will be in touch to make sure you have no questions, and to schedule your 6-month scan. ° °Lung RADS 4 A: nodules with concerning findings, recommendation is most often for a follow up scan in 3 months or additional testing based on our provider's assessment of the scan. We will be in touch to make sure you have no questions and to schedule the recommended 3 month follow up scan. ° °Lung RADS 4 B:  indicates findings that are concerning. We will be in touch with you to schedule additional diagnostic testing based on our provider's  assessment of the scan. ° °Hypnosis for smoking cessation  °[employer] °[phone] ° °Acupuncture for smoking cessation  °[facility] °[phone]  °

## 2021-04-17 ENCOUNTER — Ambulatory Visit
Admission: RE | Admit: 2021-04-17 | Discharge: 2021-04-17 | Disposition: A | Discharge status: Home / Self Care | Payer: BC Managed Care – PPO | Source: Ambulatory Visit | Attending: Acute Care | Admitting: Acute Care

## 2021-04-17 DIAGNOSIS — F17210 Nicotine dependence, cigarettes, uncomplicated: Secondary | ICD-10-CM | POA: Diagnosis not present

## 2021-04-17 DIAGNOSIS — J432 Centrilobular emphysema: Secondary | ICD-10-CM | POA: Diagnosis not present

## 2021-04-17 DIAGNOSIS — Z87891 Personal history of nicotine dependence: Secondary | ICD-10-CM

## 2021-04-17 DIAGNOSIS — I2510 Atherosclerotic heart disease of native coronary artery without angina pectoris: Secondary | ICD-10-CM | POA: Diagnosis not present

## 2021-04-17 DIAGNOSIS — J929 Pleural plaque without asbestos: Secondary | ICD-10-CM | POA: Diagnosis not present

## 2021-04-20 ENCOUNTER — Other Ambulatory Visit: Payer: Self-pay

## 2021-04-20 DIAGNOSIS — Z87891 Personal history of nicotine dependence: Secondary | ICD-10-CM

## 2021-04-20 DIAGNOSIS — F17210 Nicotine dependence, cigarettes, uncomplicated: Secondary | ICD-10-CM

## 2021-04-27 DIAGNOSIS — M5416 Radiculopathy, lumbar region: Secondary | ICD-10-CM | POA: Diagnosis not present

## 2021-06-06 ENCOUNTER — Other Ambulatory Visit: Payer: Self-pay | Admitting: Nurse Practitioner

## 2021-06-06 DIAGNOSIS — K219 Gastro-esophageal reflux disease without esophagitis: Secondary | ICD-10-CM

## 2021-06-12 ENCOUNTER — Telehealth: Payer: Self-pay | Admitting: Nurse Practitioner

## 2021-06-14 ENCOUNTER — Other Ambulatory Visit: Payer: Self-pay | Admitting: Nurse Practitioner

## 2021-06-14 DIAGNOSIS — K219 Gastro-esophageal reflux disease without esophagitis: Secondary | ICD-10-CM

## 2021-06-15 ENCOUNTER — Ambulatory Visit (INDEPENDENT_AMBULATORY_CARE_PROVIDER_SITE_OTHER): Payer: BLUE CROSS/BLUE SHIELD | Admitting: Nurse Practitioner

## 2021-06-15 ENCOUNTER — Encounter: Payer: Self-pay | Admitting: Nurse Practitioner

## 2021-06-15 DIAGNOSIS — J069 Acute upper respiratory infection, unspecified: Secondary | ICD-10-CM | POA: Diagnosis not present

## 2021-06-15 MED ORDER — AMOXICILLIN-POT CLAVULANATE 875-125 MG PO TABS: 1.0000 | ORAL_TABLET | Freq: Two times a day (BID) | ORAL | 0 refills | Status: DC

## 2021-06-15 MED ORDER — BENZONATATE 100 MG PO CAPS: 100.0000 mg | ORAL_CAPSULE | Freq: Three times a day (TID) | ORAL | 0 refills | Status: DC | PRN

## 2021-06-15 NOTE — Progress Notes (Signed)
? ?Virtual Visit  Note ?Due to COVID-19 pandemic this visit was conducted virtually. This visit type was conducted due to national recommendations for restrictions regarding the COVID-19 Pandemic (e.g. social distancing, sheltering in place) in an effort to limit this patient's exposure and mitigate transmission in our community. All issues noted in this document were discussed and addressed.  A physical exam was not performed with this format. ? ?I connected with Stacie Solis on 06/15/21 at 9:17 by telephone and verified that I am speaking with the correct person using two identifiers. Stacie Solis is currently located at home and no one is currently with her during visit. The provider, [name], FNP is located in their office at time of visit. ? ?I discussed the limitations, risks, security and privacy concerns of performing an evaluation and management service by telephone and the availability of in person appointments. I also discussed with the patient that there may be a patient responsible charge related to this service. The patient expressed understanding and agreed to proceed. ? ? ?History and Present Illness: ? ?URI  ?This is a new problem. The current episode started in the past 7 days. The problem has been gradually worsening. There has been no fever. Associated symptoms include congestion, coughing, headaches, rhinorrhea, sinus pain and a sore throat. She has tried decongestant, antihistamine and acetaminophen for the symptoms. The treatment provided mild relief.  ? ? ? ?Review of Systems  ?Constitutional:  Negative for chills and fever.  ?HENT:  Positive for congestion, rhinorrhea, sinus pain and sore throat.   ?Respiratory:  Positive for cough and sputum production. Negative for shortness of breath.   ?Neurological:  Positive for headaches.  ? ? ?Observations/Objective: ?Alert and oriented- answers all questions appropriately ?No distress ?Raspy voice ?Wet cough ? ?Assessment and  Plan: ?Stacie Solis in today with chief complaint of URI ? ? ?1. URI with cough and congestion ?1. Take meds as prescribed ?2. Use a cool mist humidifier especially during the winter months and when heat has been humid. ?3. Use saline nose sprays frequently ?4. Saline irrigations of the nose can be very helpful if done frequently. ? * 4X daily for 1 week* ? * Use of a nettie pot can be helpful with this. Follow directions with this* ?5. Drink plenty of fluids ?6. Keep thermostat turn down low ?7.For any cough or congestion- tessalon perles ?8. For fever or aces or pains- take tylenol or ibuprofen appropriate for age and weight. ? * for fevers greater than 101 orally you may alternate ibuprofen and tylenol every  3 hours. ?  ?Meds ordered this encounter  ?Medications  ? amoxicillin-clavulanate (AUGMENTIN) 875-125 MG tablet  ?  Sig: Take 1 tablet by mouth 2 (two) times daily.  ?  Dispense:  20 tablet  ?  Refill:  0  ?  Order Specific Question:   Supervising Provider  ?  Answer:   Caryl Pina A [[id number]]  ? benzonatate (TESSALON PERLES) 100 MG capsule  ?  Sig: Take 1 capsule (100 mg total) by mouth 3 (three) times daily as needed for cough.  ?  Dispense:  20 capsule  ?  Refill:  0  ?  Order Specific Question:   Supervising Provider  ?  Answer:   Caryl Pina A [[id number]]  ? ? ? ? ? ? ?Follow Up Instructions: ?prn ? ?  ?I discussed the assessment and treatment plan with the patient. The patient was provided an opportunity to ask questions  and all were answered. The patient agreed with the plan and demonstrated an understanding of the instructions. ?  ?The patient was advised to call back or seek an in-person evaluation if the symptoms worsen or if the condition fails to improve as anticipated. ? ?The above assessment and management plan was discussed with the patient. The patient verbalized understanding of and has agreed to the management plan. Patient is aware to call the clinic if symptoms persist or  worsen. Patient is aware when to return to the clinic for a follow-up visit. Patient educated on when it is appropriate to go to the emergency department.  ? ?Time call ended:  9:29 ? ?I provided 12 minutes of  non face-to-face time during this encounter. ? ? ? ?[name], FNP ? ? ?

## 2021-06-15 NOTE — Patient Instructions (Signed)

## 2021-09-01 ENCOUNTER — Encounter: Payer: Self-pay | Admitting: Nurse Practitioner

## 2021-09-01 ENCOUNTER — Ambulatory Visit (INDEPENDENT_AMBULATORY_CARE_PROVIDER_SITE_OTHER): Payer: 59 | Admitting: Nurse Practitioner

## 2021-09-01 ENCOUNTER — Telehealth: Payer: Self-pay

## 2021-09-01 VITALS — BP 133/70 | HR 91 | Temp 96.8°F | Resp 20 | Ht 62.0 in | Wt 168.0 lb

## 2021-09-01 DIAGNOSIS — F410 Panic disorder [episodic paroxysmal anxiety] without agoraphobia: Secondary | ICD-10-CM | POA: Diagnosis not present

## 2021-09-01 DIAGNOSIS — Z6835 Body mass index (BMI) 35.0-35.9, adult: Secondary | ICD-10-CM

## 2021-09-01 DIAGNOSIS — G500 Trigeminal neuralgia: Secondary | ICD-10-CM | POA: Diagnosis not present

## 2021-09-01 DIAGNOSIS — E782 Mixed hyperlipidemia: Secondary | ICD-10-CM | POA: Diagnosis not present

## 2021-09-01 DIAGNOSIS — K219 Gastro-esophageal reflux disease without esophagitis: Secondary | ICD-10-CM | POA: Diagnosis not present

## 2021-09-01 DIAGNOSIS — F3342 Major depressive disorder, recurrent, in full remission: Secondary | ICD-10-CM

## 2021-09-01 DIAGNOSIS — J0101 Acute recurrent maxillary sinusitis: Secondary | ICD-10-CM

## 2021-09-01 DIAGNOSIS — F5101 Primary insomnia: Secondary | ICD-10-CM

## 2021-09-01 DIAGNOSIS — R609 Edema, unspecified: Secondary | ICD-10-CM

## 2021-09-01 DIAGNOSIS — F17200 Nicotine dependence, unspecified, uncomplicated: Secondary | ICD-10-CM

## 2021-09-01 DIAGNOSIS — G2581 Restless legs syndrome: Secondary | ICD-10-CM

## 2021-09-01 MED ORDER — BUPROPION HCL ER (XL) 150 MG PO TB24: 150.0000 mg | ORAL_TABLET | Freq: Every day | ORAL | 1 refills | Status: DC

## 2021-09-01 MED ORDER — AMOXICILLIN-POT CLAVULANATE 875-125 MG PO TABS: 1.0000 | ORAL_TABLET | Freq: Two times a day (BID) | ORAL | 0 refills | Status: DC

## 2021-09-01 MED ORDER — ESCITALOPRAM OXALATE 20 MG PO TABS: 20.0000 mg | ORAL_TABLET | Freq: Every day | ORAL | 1 refills | Status: DC

## 2021-09-01 MED ORDER — ZOLPIDEM TARTRATE 10 MG PO TABS: 10.0000 mg | ORAL_TABLET | Freq: Every evening | ORAL | 5 refills | Status: DC | PRN

## 2021-09-01 MED ORDER — PRAMIPEXOLE DIHYDROCHLORIDE 0.5 MG PO TABS: 0.5000 mg | ORAL_TABLET | Freq: Three times a day (TID) | ORAL | 1 refills | Status: DC

## 2021-09-01 MED ORDER — BUSPIRONE HCL 5 MG PO TABS: 5.0000 mg | ORAL_TABLET | Freq: Three times a day (TID) | ORAL | 5 refills | Status: DC

## 2021-09-01 MED ORDER — ATORVASTATIN CALCIUM 40 MG PO TABS: 40.0000 mg | ORAL_TABLET | Freq: Every day | ORAL | 1 refills | Status: DC

## 2021-09-01 MED ORDER — FENOFIBRATE 134 MG PO CAPS: 134.0000 mg | ORAL_CAPSULE | Freq: Every day | ORAL | 11 refills | Status: DC

## 2021-09-01 MED ORDER — FUROSEMIDE 40 MG PO TABS: 40.0000 mg | ORAL_TABLET | Freq: Every day | ORAL | 1 refills | Status: DC

## 2021-09-01 MED ORDER — RABEPRAZOLE SODIUM 20 MG PO TBEC: 20.0000 mg | DELAYED_RELEASE_TABLET | Freq: Every day | ORAL | 1 refills | Status: DC

## 2021-09-01 MED ORDER — BENZONATATE 100 MG PO CAPS: 100.0000 mg | ORAL_CAPSULE | Freq: Three times a day (TID) | ORAL | 0 refills | Status: DC | PRN

## 2021-09-01 NOTE — Telephone Encounter (Signed)
[facility] Key[place]nie Kind - [place] Case ID: [id number] - Rx #: [id number] Need help? Call us at [phone] Status Sent to [facility] Fenofibrate '134MG'$  capsules Form [facility] Electronic Prior Authorization Form (2017 NCPDP) Original Claim Info 5

## 2021-09-01 NOTE — Patient Instructions (Signed)

## 2021-09-01 NOTE — Progress Notes (Signed)
Subjective:    Patient ID: Stacie Solis, female    DOB: [birth year], 61 y.o.   MRN: [id number]  Chief Complaint: medical management of chronic issues     HPI:  Stacie Solis is a 61 y.o. who identifies as a female who was assigned female at birth.   Social history: Lives with: husband Work history: retired   Scientist, forensic in today for follow up of the following chronic medical issues:  1. Mixed hyperlipidemia Does not watch diet and does no exercise. Lab Results  Component Value Date   CHOL 117 02/28/2021   HDL 33 (L) 02/28/2021   LDLCALC 59 02/28/2021   TRIG 140 02/28/2021   CHOLHDL 3.5 02/28/2021   The ASCVD Risk score (Arnett DK, et al., 2019) failed to calculate for the following reasons:   The valid total cholesterol range is 130 to 320 mg/dL   2. Gastroesophageal reflux disease without esophagitis Is on aciphex daily and is doing well.  3. Trigeminal neuralgia No problems. Denies any pain recently  4. Panic attacks Is on buspar as needed.    09/01/2021   10:14 AM 02/28/2021   12:13 PM 08/15/2020    2:15 PM 07/19/2020   11:33 AM  GAD 7 : Generalized Anxiety Score  Nervous, Anxious, on Edge 0 _0 Control/stop worrying 0 _1 Worry too much - different things 0 _2 Trouble relaxing 0 _3 Restless 0 _4 Easily annoyed or irritable 0 _5 Afraid - awful might happen 0 0 1 1  Total GAD 7 Score 0 _6 Anxiety Difficulty Not difficult at all Not difficult at all Not difficult at all Not difficult at all      5. Recurrent major depressive disorder, in full remission ([place]) Is on lexapro and wellbutrin daily and is doingok. Says she has a lot of crying spells.    09/01/2021   10:14 AM 02/28/2021   12:12 PM 08/15/2020    2:14 PM  Depression screen PHQ 2/9  Decreased Interest 0 1 1  Down, Depressed, Hopeless 0 0 1  PHQ - 2 Score 0 1 2  Altered sleeping 0 1 3  Tired, decreased energy 0 2 3  Change in appetite 0 0 1  Feeling bad or failure about  yourself  0 0 1  Trouble concentrating 0 0 1  Moving slowly or fidgety/restless 0 1 0  Suicidal thoughts 0 0 0  PHQ-9 Score 0 5 11  Difficult doing work/chores Not difficult at all Somewhat difficult Somewhat difficult      6. Primary insomnia Is on ambien nightly and sleeps 8 hours a night  7. RLS (restless legs syndrome) Is on mirapex nightly which helps her legs rest at night  8. Smoker Smokes a pack a day  9. BMI 35.0-35.9,adult Weight is down 4 lbs Wt Readings from Last 3 Encounters:  09/01/21 168 lb (76.2 kg)  02/28/21 172 lb (78 kg)  08/15/20 181 lb (82.1 kg)   BMI Readings from Last 3 Encounters:  09/01/21 30.73 kg/m  02/28/21 31.46 kg/m  08/15/20 33.11 kg/m      New complaints: Sinus congestion and facial pressure for 10 days. Allergy meds not helping  No Known Allergies Outpatient Encounter Medications as of 09/01/2021  Medication Sig   albuterol (VENTOLIN HFA) 108 (90 Base) MCG/ACT inhaler USE 2 PUFFS EVERY 4 HOURS AS NEEDED   atorvastatin (  LIPITOR) 40 MG tablet Take 1 tablet (40 mg total) by mouth daily.   buPROPion (WELLBUTRIN XL) 150 MG 24 hr tablet Take 1 tablet (150 mg total) by mouth daily.   busPIRone (BUSPAR) 5 MG tablet Take 1 tablet (5 mg total) by mouth 3 (three) times daily.   cholecalciferol (VITAMIN D) 1000 UNITS tablet Take 1,000 Units by mouth daily.   cyclobenzaprine (FLEXERIL) 10 MG tablet TAKE 1 TABLET BY MOUTH EVERYDAY AT BEDTIME   escitalopram (LEXAPRO) 20 MG tablet Take 1 tablet (20 mg total) by mouth daily.   fenofibrate micronized (LOFIBRA) 134 MG capsule Take 1 capsule (134 mg total) by mouth daily before breakfast.   fish oil-omega-3 fatty acids 1000 MG capsule Take 2 g by mouth 2 (two) times daily.   furosemide (LASIX) 40 MG tablet Take 1 tablet (40 mg total) by mouth daily.   magic mouthwash SOLN Take 5 mLs by mouth daily.   ondansetron (ZOFRAN) 4 MG tablet TAKE 1 TABLET BY MOUTH EVERY 8 HOURS AS NEEDED FOR NAUSEA AND VOMITING  (Patient not taking: Reported on 02/28/2021)   pramipexole (MIRAPEX) 0.5 MG tablet Take 1 tablet (0.5 mg total) by mouth 3 (three) times daily.   RABEprazole (ACIPHEX) 20 MG tablet TAKE 1 TABLET BY MOUTH EVERY DAY   sodium chloride (OCEAN) 0.65 % SOLN nasal spray Place 1 spray into both nostrils as needed for congestion.   triamcinolone (NASACORT) 55 MCG/ACT AERO nasal inhaler PLACE 2 SPRAYS INTO THE NOSE DAILY.   zolpidem (AMBIEN) 10 MG tablet Take 1 tablet (10 mg total) by mouth at bedtime as needed for sleep.   [DISCONTINUED] amoxicillin-clavulanate (AUGMENTIN) 875-125 MG tablet Take 1 tablet by mouth 2 (two) times daily.   [DISCONTINUED] benzonatate (TESSALON PERLES) 100 MG capsule Take 1 capsule (100 mg total) by mouth 3 (three) times daily as needed for cough.   No facility-administered encounter medications on file as of 09/01/2021.    Past Surgical History:  Procedure Laterality Date   BRAIN SURGERY     trig neuralgia   CHOLECYSTECTOMY      Family History  Problem Relation Age of Onset   Hypertension Mother    Diabetes Mother    Arthritis Mother    Lung cancer Maternal Grandfather       Controlled substance contract: 03/07/21     Review of Systems  Constitutional:  Negative for diaphoresis.  Eyes:  Negative for pain.  Respiratory:  Negative for shortness of breath.   Cardiovascular:  Negative for chest pain, palpitations and leg swelling.  Gastrointestinal:  Negative for abdominal pain.  Endocrine: Negative for polydipsia.  Skin:  Negative for rash.  Neurological:  Negative for dizziness, weakness and headaches.  Hematological:  Does not bruise/bleed easily.  All other systems reviewed and are negative.      Objective:   Physical Exam Vitals and nursing note reviewed.  Constitutional:      General: She is not in acute distress.    Appearance: Normal appearance. She is well-developed.  HENT:     Head: Normocephalic.     Right Ear: Tympanic membrane normal.      Left Ear: Tympanic membrane normal.     Nose: Nose normal.     Mouth/Throat:     Mouth: Mucous membranes are moist.  Eyes:     Pupils: Pupils are equal, round, and reactive to light.  Neck:     Vascular: No carotid bruit or JVD.  Cardiovascular:     Rate and  Rhythm: Normal rate and regular rhythm.     Heart sounds: Normal heart sounds.  Pulmonary:     Effort: Pulmonary effort is normal. No respiratory distress.     Breath sounds: Normal breath sounds. No wheezing or rales.  Chest:     Chest wall: No tenderness.  Abdominal:     General: Bowel sounds are normal. There is no distension or abdominal bruit.     Palpations: Abdomen is soft. There is no hepatomegaly, splenomegaly, mass or pulsatile mass.     Tenderness: There is no abdominal tenderness.  Musculoskeletal:        General: Normal range of motion.     Cervical back: Normal range of motion and neck supple.     Right lower leg: Edema (1+ foot) present.     Left lower leg: Edema (1+ foot) present.  Lymphadenopathy:     Cervical: No cervical adenopathy.  Skin:    General: Skin is warm and dry.  Neurological:     Mental Status: She is alert and oriented to person, place, and time.     Deep Tendon Reflexes: Reflexes are normal and symmetric.  Psychiatric:        Behavior: Behavior normal.        Thought Content: Thought content normal.        Judgment: Judgment normal.    BP 133/70   Pulse 91   Temp (!) 96.8 F (36 C) (Temporal)   Resp 20   Ht _0  (1.575 m)   Wt 168 lb (76.2 kg)   SpO2 96%   BMI 30.73 kg/m         Assessment & Plan:   Stacie Solis comes in today with chief complaint of Medical Management of Chronic Issues (Wants meds for sinus problem and cough/)   Diagnosis and orders addressed:  1. Mixed hyperlipidemia Low fat diet - atorvastatin (LIPITOR) 40 MG tablet; Take 1 tablet (40 mg total) by mouth daily.  Dispense: 90 tablet; Refill: 1 - fenofibrate micronized (LOFIBRA) 134 MG capsule;  Take 1 capsule (134 mg total) by mouth daily before breakfast.  Dispense: 30 capsule; Refill: 11 - CBC with Differential/Platelet - CMP14+EGFR - Lipid panel  2. Gastroesophageal reflux disease without esophagitis Avoid spicy foods Do not eat 2 hours prior to bedtime - RABEprazole (ACIPHEX) 20 MG tablet; Take 1 tablet (20 mg total) by mouth daily.  Dispense: 90 tablet; Refill: 1  3. Trigeminal neuralgia  4. Panic attacks Stress management - busPIRone (BUSPAR) 5 MG tablet; Take 1 tablet (5 mg total) by mouth 3 (three) times daily.  Dispense: 90 tablet; Refill: 5  5. Recurrent major depressive disorder, in full remission ([place]) - escitalopram (LEXAPRO) 20 MG tablet; Take 1 tablet (20 mg total) by mouth daily.  Dispense: 90 tablet; Refill: 1 - buPROPion (WELLBUTRIN XL) 150 MG 24 hr tablet; Take 1 tablet (150 mg total) by mouth daily.  Dispense: 90 tablet; Refill: 1  6. Primary insomnia Bedtime r outine - zolpidem (AMBIEN) 10 MG tablet; Take 1 tablet (10 mg total) by mouth at bedtime as needed for sleep.  Dispense: 30 tablet; Refill: 5  7. RLS (restless legs syndrome) Keep legs warm at night - pramipexole (MIRAPEX) 0.5 MG tablet; Take 1 tablet (0.5 mg total) by mouth 3 (three) times daily.  Dispense: 270 tablet; Refill: 1  8. Smoker Smoking cessation encouraged  9. BMI 35.0-35.9,adult Discussed diet and exercise for person with BMI >25 Will recheck weight in 3-6 months  10. Acute recurrent maxillary sinusitis 1. Take meds as prescribed 2. Use a cool mist humidifier especially during the winter months and when heat has been humid. 3. Use saline nose sprays frequently 4. Saline irrigations of the nose can be very helpful if done frequently.  * 4X daily for 1 week*  * Use of a nettie pot can be helpful with this. Follow directions with this* 5. Drink plenty of fluids 6. Keep thermostat turn down low 7.For any cough or congestion- tessalon perles 8. For fever or aces or pains-  take tylenol or ibuprofen appropriate for age and weight.  * for fevers greater than 101 orally you may alternate ibuprofen and tylenol every  3 hours.    - amoxicillin-clavulanate (AUGMENTIN) 875-125 MG tablet; Take 1 tablet by mouth 2 (two) times daily.  Dispense: 14 tablet; Refill: 0    11. Peripheral edema elevate  legs when sitting -furosemide (LASIX) 40 MG tablet; Take 1 tablet (40 mg total) by mouth daily.  Dispense: 90 tablet; Refill: 1   Labs pending Health Maintenance reviewed Diet and exercise encouraged  Follow up plan: 6 months   [name], FNP

## 2021-09-01 NOTE — Telephone Encounter (Signed)
[facility] Key[place]nie Kind - [place] Case ID: [id number] - Rx #: [id number] Need help? Call us at [phone] Outcome Approvedtoday Request Reference Number: [id number]. FENOFIBRATE CAP '134MG'$  is approved through 09/02/2022. Your patient may now fill this prescription and it will be covered. Drug Fenofibrate '134MG'$  capsules Form [employer] Electronic Prior Authorization Form (2017 NCPDP) Original Claim Info 22  Patient and pharmacy informed

## 2021-09-02 LAB — LIPID PANEL
Chol/HDL Ratio: 3.5 ratio (ref 0.0–4.4)
Cholesterol, Total: 109 mg/dL (ref 100–199)
HDL: 31 mg/dL — ABNORMAL LOW (ref >39)
LDL Chol Calc (NIH): 54 mg/dL (ref 0–99)
Triglycerides: 136 mg/dL (ref 0–149)
VLDL Cholesterol Cal: 24 mg/dL (ref 5–40)

## 2021-09-02 LAB — CMP14+EGFR
ALT: 29 IU/L (ref 0–32)
AST: 27 IU/L (ref 0–40)
Albumin/Globulin Ratio: 1.8 (ref 1.2–2.2)
Albumin: 4.4 g/dL (ref 3.8–4.8)
Alkaline Phosphatase: 77 IU/L (ref 44–121)
BUN/Creatinine Ratio: 15 (ref 12–28)
BUN: 21 mg/dL (ref 8–27)
Bilirubin Total: 0.4 mg/dL (ref 0.0–1.2)
CO2: 23 mmol/L (ref 20–29)
Calcium: 9.4 mg/dL (ref 8.7–10.3)
Chloride: 106 mmol/L (ref 96–106)
Creatinine, Ser: 1.37 mg/dL — ABNORMAL HIGH (ref 0.57–1.00)
Globulin, Total: 2.5 g/dL (ref 1.5–4.5)
Glucose: 96 mg/dL (ref 70–99)
Potassium: 4 mmol/L (ref 3.5–5.2)
Sodium: 145 mmol/L — ABNORMAL HIGH (ref 134–144)
Total Protein: 6.9 g/dL (ref 6.0–8.5)
eGFR: 44 mL/min/{1.73_m2} — ABNORMAL LOW (ref >59)

## 2021-09-02 LAB — CBC WITH DIFFERENTIAL/PLATELET
Basophils Absolute: 0.1 10*3/uL (ref 0.0–0.2)
Basos: 1 %
EOS (ABSOLUTE): 0.1 10*3/uL (ref 0.0–0.4)
Eos: 1 %
Hematocrit: 42.4 % (ref 34.0–46.6)
Hemoglobin: 14.4 g/dL (ref 11.1–15.9)
Immature Grans (Abs): 0.1 10*3/uL (ref 0.0–0.1)
Immature Granulocytes: 1 %
Lymphocytes Absolute: 1.9 10*3/uL (ref 0.7–3.1)
Lymphs: 14 %
MCH: 30.8 pg (ref 26.6–33.0)
MCHC: 34 g/dL (ref 31.5–35.7)
MCV: 91 fL (ref 79–97)
Monocytes Absolute: 0.7 10*3/uL (ref 0.1–0.9)
Monocytes: 6 %
Neutrophils Absolute: 10.3 10*3/uL — ABNORMAL HIGH (ref 1.4–7.0)
Neutrophils: 77 %
Platelets: 235 10*3/uL (ref 150–450)
RBC: 4.67 x10E6/uL (ref 3.77–5.28)
RDW: 12.1 % (ref 11.7–15.4)
WBC: 13.3 10*3/uL — ABNORMAL HIGH (ref 3.4–10.8)

## 2021-09-14 ENCOUNTER — Telehealth: Payer: Self-pay | Admitting: Nurse Practitioner

## 2021-09-14 NOTE — Telephone Encounter (Signed)
Give it time. Meds stay in system for several days after stop taking. Should n ot need another antibiotic.

## 2021-09-14 NOTE — Telephone Encounter (Signed)
Please review and advise.

## 2021-09-15 MED ORDER — BENZONATATE 100 MG PO CAPS: 100.0000 mg | ORAL_CAPSULE | Freq: Three times a day (TID) | ORAL | 0 refills | Status: DC | PRN

## 2021-09-15 NOTE — Telephone Encounter (Signed)
Aware. 

## 2021-09-19 NOTE — Telephone Encounter (Signed)
Please review and advise.

## 2021-09-19 NOTE — Telephone Encounter (Signed)
Pt has done everything MMM has advised and finished abx. Pt wants another round of abx. Use [facility]. Please call back

## 2021-09-20 ENCOUNTER — Ambulatory Visit (INDEPENDENT_AMBULATORY_CARE_PROVIDER_SITE_OTHER): Payer: 59

## 2021-09-20 ENCOUNTER — Telehealth: Payer: Self-pay | Admitting: Emergency Medicine

## 2021-09-20 ENCOUNTER — Encounter: Payer: Self-pay | Admitting: Family Medicine

## 2021-09-20 ENCOUNTER — Ambulatory Visit (INDEPENDENT_AMBULATORY_CARE_PROVIDER_SITE_OTHER): Payer: 59 | Admitting: Family Medicine

## 2021-09-20 VITALS — BP 124/69 | HR 107 | Temp 99.2°F | Ht 62.0 in | Wt 168.0 lb

## 2021-09-20 DIAGNOSIS — J189 Pneumonia, unspecified organism: Secondary | ICD-10-CM | POA: Diagnosis not present

## 2021-09-20 DIAGNOSIS — R059 Cough, unspecified: Secondary | ICD-10-CM | POA: Diagnosis not present

## 2021-09-20 DIAGNOSIS — R509 Fever, unspecified: Secondary | ICD-10-CM | POA: Diagnosis not present

## 2021-09-20 DIAGNOSIS — Z720 Tobacco use: Secondary | ICD-10-CM

## 2021-09-20 MED ORDER — CEFDINIR 300 MG PO CAPS: 300.0000 mg | ORAL_CAPSULE | Freq: Two times a day (BID) | ORAL | 0 refills | Status: AC

## 2021-09-20 MED ORDER — PROMETHAZINE-DM 6.25-15 MG/5ML PO SYRP: 2.5000 mL | ORAL_SOLUTION | Freq: Four times a day (QID) | ORAL | 0 refills | Status: DC | PRN

## 2021-09-20 NOTE — Patient Instructions (Signed)
Stopping smoking is the #1 thing that you can do to prevent recurrent sinus infections. I highly recommend that you stop smoking if you do not want to continue having infections.

## 2021-09-20 NOTE — Progress Notes (Signed)
Subjective: CC:URI PCP: Chevis Pretty, FNP JME:QASTM S Stacie Solis is a 61 y.o. female presenting to clinic today for:  1.  Cough, congestion, fever Patient reports that she has been having cough, headache and some myalgias since she saw her PCP on 09/01/2021.  She was placed on Augmentin but notes that symptoms really never improved despite Augmentin and Tessalon Perles.  She has been using Allegra-D, DayQuil and another over-the-counter fluid pill without much improvement.  She has been coughing yellow mucus.  No brown sputum or hemoptysis reported.  She has not done any self COVID test nor has been tested for COVID elsewhere.  She reports baseline shortness of breath and wheeze that is relieved by the inhaler.  Last night she spiked her first fevers 102 F.  She has been feeling sweaty and hot.  She has had a couple of sick contacts at home that had pinkeye and allergy type symptoms but no other illnesses that she knows of.  She is been vaccinated few times before COVID-19.  No known history of COPD but again she has been a long-term smoker.    ROS: Per HPI  No Known Allergies Past Medical History:  Diagnosis Date   Allergy    Anxiety    Chronic back pain    GERD (gastroesophageal reflux disease)    Hyperlipidemia     Current Outpatient Medications:    albuterol (VENTOLIN HFA) 108 (90 Base) MCG/ACT inhaler, USE 2 PUFFS EVERY 4 HOURS AS NEEDED, Disp: 8.5 each, Rfl: 3   atorvastatin (LIPITOR) 40 MG tablet, Take 1 tablet (40 mg total) by mouth daily., Disp: 90 tablet, Rfl: 1   benzonatate (TESSALON PERLES) 100 MG capsule, Take 1 capsule (100 mg total) by mouth 3 (three) times daily as needed for cough., Disp: 20 capsule, Rfl: 0   buPROPion (WELLBUTRIN XL) 150 MG 24 hr tablet, Take 1 tablet (150 mg total) by mouth daily., Disp: 90 tablet, Rfl: 1   busPIRone (BUSPAR) 5 MG tablet, Take 1 tablet (5 mg total) by mouth 3 (three) times daily., Disp: 90 tablet, Rfl: 5   cholecalciferol (VITAMIN  D) 1000 UNITS tablet, Take 1,000 Units by mouth daily., Disp: , Rfl:    escitalopram (LEXAPRO) 20 MG tablet, Take 1 tablet (20 mg total) by mouth daily., Disp: 90 tablet, Rfl: 1   fenofibrate micronized (LOFIBRA) 134 MG capsule, Take 1 capsule (134 mg total) by mouth daily before breakfast., Disp: 30 capsule, Rfl: 11   fish oil-omega-3 fatty acids 1000 MG capsule, Take 2 g by mouth 2 (two) times daily., Disp: , Rfl:    furosemide (LASIX) 40 MG tablet, Take 1 tablet (40 mg total) by mouth daily., Disp: 90 tablet, Rfl: 1   magic mouthwash SOLN, Take 5 mLs by mouth daily., Disp: 15 mL, Rfl: 1   pramipexole (MIRAPEX) 0.5 MG tablet, Take 1 tablet (0.5 mg total) by mouth 3 (three) times daily., Disp: 270 tablet, Rfl: 1   RABEprazole (ACIPHEX) 20 MG tablet, Take 1 tablet (20 mg total) by mouth daily., Disp: 90 tablet, Rfl: 1   triamcinolone (NASACORT) 55 MCG/ACT AERO nasal inhaler, PLACE 2 SPRAYS INTO THE NOSE DAILY., Disp: 16.9 each, Rfl: 0   zolpidem (AMBIEN) 10 MG tablet, Take 1 tablet (10 mg total) by mouth at bedtime as needed for sleep., Disp: 30 tablet, Rfl: 5 Social History   Socioeconomic History   Marital status: Married    Spouse name: Not on file   Number of children: 2  Years of education: Not on file   Highest education level: Not on file  Occupational History   Occupation: N/A  Tobacco Use   Smoking status: Every Day    Packs/day: 1.75    Years: 48.00    Total pack years: 84.00    Types: Cigarettes   Smokeless tobacco: Never   Tobacco comments:    Declined info  Vaping Use   Vaping Use: Never used  Substance and Sexual Activity   Alcohol use: No    Alcohol/week: 0.0 standard drinks of alcohol   Drug use: No   Sexual activity: Not on file  Other Topics Concern   Not on file  Social History Narrative   Not on file   Social Determinants of Health   Financial Resource Strain: Not on file  Food Insecurity: Not on file  Transportation Needs: Not on file  Physical  Activity: Not on file  Stress: Not on file  Social Connections: Not on file  Intimate Partner Violence: Not on file   Family History  Problem Relation Age of Onset   Hypertension Mother    Diabetes Mother    Arthritis Mother    Lung cancer Maternal Grandfather     Objective: Office vital signs reviewed. BP 124/69   Pulse (!) 107   Temp 99.2 F (37.3 C)   Ht '5\' 2"'$  (1.575 m)   Wt 168 lb (76.2 kg)   SpO2 93%   BMI 30.73 kg/m   Physical Examination:  General: Awake, alert, sweaty, ill-appearing.  HEENT: Normal    Neck: No masses palpated. No lymphadenopathy    Ears: Tympanic membranes intact, normal light reflex, no erythema, no bulging    Eyes: PERRLA, extraocular membranes intact, sclera white    Nose: nasal turbinates moist, clear nasal discharge    Throat: moist mucus membranes, mild to moderate oropharyngeal erythema, no tonsillar exudate.  Airway is patent Cardio: regular rate and rhythm, S1S2 heard, no murmurs appreciated Pulm: Coarse breath sounds noted in the left anterior lung fields.  Slight decrease lung sounds in the middle posterior left lung fields.  No wheezing.  She has normal work of breathing on room air.  Coughing intermittently  Assessment/ Plan: 61 y.o. female   Pneumonia of left lower lobe due to infectious organism - Plan: promethazine-dextromethorphan (PROMETHAZINE-DM) 6.25-15 MG/5ML syrup, cefdinir (OMNICEF) 300 MG capsule  Febrile illness - Plan: Novel Coronavirus, NAA ([facility]), DG Chest 2 View, cefdinir (OMNICEF) 300 MG capsule  Cough in adult - Plan: DG Chest 2 View, promethazine-dextromethorphan (PROMETHAZINE-DM) 6.25-15 MG/5ML syrup  Tobacco user - Plan: DG Chest 2 View  Personal review of chest x-ray demonstrates an increased opacity in the left lower lobe concerning for pneumonia.  I am placing her on Omnicef.  I did consider Z-Pak but she declined this as it "never works for her".  We discussed red flag signs and symptoms warranting  further evaluation.  If she has had no palpable improvement in the next 48 hours or if she has abrupt worsening she is to contact the office immediately for in person reevaluation.  Promethazine with dextromethorphan since and symptoms have been refractory to [employer].  I did not want to give her anything containing codeine since she is chronically treated with codeine containing products.  COVID testing has been sent for completion as well  I highly recommended smoking cessation with this patient.  Counseled her at length on impact of tobacco on her overall health and particularly risk of recurrent infections  Orders Placed This Encounter  Procedures   Novel Coronavirus, NAA ([facility])    Order Specific Question:   Previously tested for COVID-19    Answer:   No    Order Specific Question:   Resident in a congregate (group) care setting    Answer:   No    Order Specific Question:   Is the patient student?    Answer:   No    Order Specific Question:   Employed in healthcare setting    Answer:   No    Order Specific Question:   Pregnant    Answer:   No    Order Specific Question:   Has patient completed COVID vaccination(s) (2 doses of Pfizer/Moderna 1 dose of [employer])    Answer:   Yes    Order Specific Question:   Has patient completed COVID Booster / 3rd dose    Answer:   Yes   No orders of the defined types were placed in this encounter.    Janora Norlander, DO [place] [phone]

## 2021-09-20 NOTE — Telephone Encounter (Signed)
Patient saw Dr. Lajuana Ripple today 09/20/2021  TC from [facility] Radiology:   Chest Xray  Impression: Ill-defined opacity within the left lung base, likely reflecting pneumonia given the provided history. Followup PA and lateral chest radiographs are recommended in 3-4 weeks following trial of antibiotic therapy to ensure resolution and exclude underlying malignancy.

## 2021-09-21 LAB — NOVEL CORONAVIRUS, NAA: SARS-CoV-2, NAA: NOT DETECTED

## 2021-09-21 NOTE — Telephone Encounter (Signed)
Spoke with patient and she was seen in the office yesterday. Advised patient to contact us with any further needs. Patient verbalized understanding

## 2021-09-21 NOTE — Telephone Encounter (Signed)
If no better needs to be seen- is she on flonase and claritin?

## 2021-10-10 ENCOUNTER — Other Ambulatory Visit: Payer: Self-pay | Admitting: Nurse Practitioner

## 2021-10-10 DIAGNOSIS — F3342 Major depressive disorder, recurrent, in full remission: Secondary | ICD-10-CM

## 2021-10-10 DIAGNOSIS — E782 Mixed hyperlipidemia: Secondary | ICD-10-CM

## 2021-10-30 ENCOUNTER — Other Ambulatory Visit: Payer: Self-pay | Admitting: Nurse Practitioner

## 2021-10-30 DIAGNOSIS — E782 Mixed hyperlipidemia: Secondary | ICD-10-CM

## 2021-10-30 DIAGNOSIS — F3342 Major depressive disorder, recurrent, in full remission: Secondary | ICD-10-CM

## 2021-11-01 ENCOUNTER — Other Ambulatory Visit: Payer: Self-pay | Admitting: Nurse Practitioner

## 2021-11-06 DIAGNOSIS — M5416 Radiculopathy, lumbar region: Secondary | ICD-10-CM | POA: Diagnosis not present

## 2021-11-06 DIAGNOSIS — G894 Chronic pain syndrome: Secondary | ICD-10-CM | POA: Diagnosis not present

## 2021-11-06 DIAGNOSIS — M5459 Other low back pain: Secondary | ICD-10-CM | POA: Diagnosis not present

## 2021-11-06 DIAGNOSIS — Z79891 Long term (current) use of opiate analgesic: Secondary | ICD-10-CM | POA: Diagnosis not present

## 2021-11-13 DIAGNOSIS — M5416 Radiculopathy, lumbar region: Secondary | ICD-10-CM | POA: Diagnosis not present

## 2021-11-25 ENCOUNTER — Other Ambulatory Visit: Payer: Self-pay | Admitting: Nurse Practitioner

## 2021-11-25 DIAGNOSIS — F3342 Major depressive disorder, recurrent, in full remission: Secondary | ICD-10-CM

## 2021-11-25 DIAGNOSIS — E782 Mixed hyperlipidemia: Secondary | ICD-10-CM

## 2021-11-28 ENCOUNTER — Other Ambulatory Visit: Payer: Self-pay | Admitting: Nurse Practitioner

## 2021-12-14 ENCOUNTER — Other Ambulatory Visit: Payer: Self-pay | Admitting: Nurse Practitioner

## 2021-12-14 DIAGNOSIS — F3342 Major depressive disorder, recurrent, in full remission: Secondary | ICD-10-CM

## 2021-12-19 ENCOUNTER — Other Ambulatory Visit: Payer: Self-pay | Admitting: Family Medicine

## 2022-01-01 ENCOUNTER — Ambulatory Visit
Admission: RE | Admit: 2022-01-01 | Discharge: 2022-01-01 | Disposition: A | Discharge status: Home / Self Care | Payer: 59 | Source: Ambulatory Visit | Attending: Nurse Practitioner | Admitting: Nurse Practitioner

## 2022-01-01 ENCOUNTER — Other Ambulatory Visit: Payer: Self-pay | Admitting: Nurse Practitioner

## 2022-01-01 DIAGNOSIS — Z1231 Encounter for screening mammogram for malignant neoplasm of breast: Secondary | ICD-10-CM

## 2022-02-13 ENCOUNTER — Other Ambulatory Visit: Payer: Self-pay | Admitting: Family Medicine

## 2022-02-13 DIAGNOSIS — M5416 Radiculopathy, lumbar region: Secondary | ICD-10-CM | POA: Diagnosis not present

## 2022-02-23 ENCOUNTER — Encounter: Payer: Self-pay | Admitting: Family Medicine

## 2022-02-23 ENCOUNTER — Telehealth (INDEPENDENT_AMBULATORY_CARE_PROVIDER_SITE_OTHER): Payer: 59 | Admitting: Family Medicine

## 2022-02-23 DIAGNOSIS — J0141 Acute recurrent pansinusitis: Secondary | ICD-10-CM | POA: Diagnosis not present

## 2022-02-23 MED ORDER — AMOXICILLIN-POT CLAVULANATE 875-125 MG PO TABS: 1.0000 | ORAL_TABLET | Freq: Two times a day (BID) | ORAL | 0 refills | Status: AC

## 2022-02-23 MED ORDER — BENZONATATE 100 MG PO CAPS: 100.0000 mg | ORAL_CAPSULE | Freq: Three times a day (TID) | ORAL | 0 refills | Status: DC | PRN

## 2022-02-23 NOTE — Progress Notes (Signed)
Virtual Visit  Note Due to COVID-19 pandemic this visit was conducted virtually. This visit type was conducted due to national recommendations for restrictions regarding the COVID-19 Pandemic (e.g. social distancing, sheltering in place) in an effort to limit this patient's exposure and mitigate transmission in our community. All issues noted in this document were discussed and addressed.  A physical exam was not performed with this format.  I connected with Stacie Solis on 02/23/22 at 1255 by telephone and verified that I am speaking with the correct person using two identifiers. Stacie Solis is currently located at home and no one is currently with her during the visit. The provider, Gwenlyn Perking, FNP is located in their office at time of visit.  I discussed the limitations, risks, security and privacy concerns of performing an evaluation and management service by telephone and the availability of in person appointments. I also discussed with the patient that there may be a patient responsible charge related to this service. The patient expressed understanding and agreed to proceed.  Unable to connect via video. Telephone audio only.   History and Present Illness:  URI  This is a new problem. Episode onset: 7 days ago. The problem has been unchanged. There has been no fever. Associated symptoms include congestion, coughing, ear pain, headaches, nausea and sinus pain. Pertinent negatives include no chest pain, sore throat, vomiting or wheezing. She has tried decongestant, acetaminophen and antihistamine (nasocort) for the symptoms. The treatment provided mild relief.   Hx of recurrent sinusitis.    Review of Systems  HENT:  Positive for congestion, ear pain and sinus pain. Negative for sore throat.   Respiratory:  Positive for cough. Negative for wheezing.   Cardiovascular:  Negative for chest pain.  Gastrointestinal:  Positive for nausea. Negative for vomiting.  Neurological:   Positive for headaches.     Observations/Objective: Alert and oriented x 3. Able to speak in full sentences without difficulty.    Assessment and Plan: Diagnoses and all orders for this visit:  Acute recurrent pansinusitis -     benzonatate (TESSALON PERLES) 100 MG capsule; Take 1 capsule (100 mg total) by mouth 3 (three) times daily as needed for cough. -     amoxicillin-clavulanate (AUGMENTIN) 875-125 MG tablet; Take 1 tablet by mouth 2 (two) times daily for 7 days.     Follow Up Instructions: Diagnoses and all orders for this visit:  Acute recurrent pansinusitis Augmentin as below. Tessalon perles prn. Discussed symptomatic care and return precautions.  -     benzonatate (TESSALON PERLES) 100 MG capsule; Take 1 capsule (100 mg total) by mouth 3 (three) times daily as needed for cough. -     amoxicillin-clavulanate (AUGMENTIN) 875-125 MG tablet; Take 1 tablet by mouth 2 (two) times daily for 7 days.   I discussed the assessment and treatment plan with the patient. The patient was provided an opportunity to ask questions and all were answered. The patient agreed with the plan and demonstrated an understanding of the instructions.   The patient was advised to call back or seek an in-person evaluation if the symptoms worsen or if the condition fails to improve as anticipated.  The above assessment and management plan was discussed with the patient. The patient verbalized understanding of and has agreed to the management plan. Patient is aware to call the clinic if symptoms persist or worsen. Patient is aware when to return to the clinic for a follow-up visit. Patient educated on when  it is appropriate to go to the emergency department.   Time call ended:  1301  I provided 6 minutes of  non face-to-face time during this encounter.    Gwenlyn Perking, FNP

## 2022-03-05 ENCOUNTER — Ambulatory Visit: Payer: 59 | Admitting: Nurse Practitioner

## 2022-03-05 ENCOUNTER — Encounter: Payer: Self-pay | Admitting: Nurse Practitioner

## 2022-03-05 VITALS — BP 118/79 | HR 69 | Temp 97.8°F | Resp 20 | Ht 62.0 in | Wt 171.0 lb

## 2022-03-05 DIAGNOSIS — F17210 Nicotine dependence, cigarettes, uncomplicated: Secondary | ICD-10-CM

## 2022-03-05 DIAGNOSIS — G500 Trigeminal neuralgia: Secondary | ICD-10-CM | POA: Diagnosis not present

## 2022-03-05 DIAGNOSIS — M545 Low back pain, unspecified: Secondary | ICD-10-CM

## 2022-03-05 DIAGNOSIS — G8929 Other chronic pain: Secondary | ICD-10-CM

## 2022-03-05 DIAGNOSIS — R0981 Nasal congestion: Secondary | ICD-10-CM

## 2022-03-05 DIAGNOSIS — Z6835 Body mass index (BMI) 35.0-35.9, adult: Secondary | ICD-10-CM | POA: Diagnosis not present

## 2022-03-05 DIAGNOSIS — K219 Gastro-esophageal reflux disease without esophagitis: Secondary | ICD-10-CM

## 2022-03-05 DIAGNOSIS — R300 Dysuria: Secondary | ICD-10-CM | POA: Diagnosis not present

## 2022-03-05 DIAGNOSIS — G2581 Restless legs syndrome: Secondary | ICD-10-CM

## 2022-03-05 DIAGNOSIS — Z23 Encounter for immunization: Secondary | ICD-10-CM

## 2022-03-05 DIAGNOSIS — F3342 Major depressive disorder, recurrent, in full remission: Secondary | ICD-10-CM

## 2022-03-05 DIAGNOSIS — R609 Edema, unspecified: Secondary | ICD-10-CM | POA: Diagnosis not present

## 2022-03-05 DIAGNOSIS — E782 Mixed hyperlipidemia: Secondary | ICD-10-CM | POA: Diagnosis not present

## 2022-03-05 DIAGNOSIS — R69 Illness, unspecified: Secondary | ICD-10-CM | POA: Diagnosis not present

## 2022-03-05 DIAGNOSIS — F5101 Primary insomnia: Secondary | ICD-10-CM

## 2022-03-05 DIAGNOSIS — F17200 Nicotine dependence, unspecified, uncomplicated: Secondary | ICD-10-CM

## 2022-03-05 DIAGNOSIS — F410 Panic disorder [episodic paroxysmal anxiety] without agoraphobia: Secondary | ICD-10-CM

## 2022-03-05 LAB — CBC WITH DIFFERENTIAL/PLATELET
Basophils Absolute: 0.2 10*3/uL (ref 0.0–0.2)
Basos: 2 %
EOS (ABSOLUTE): 0.2 10*3/uL (ref 0.0–0.4)
Eos: 2 %
Hematocrit: 41.1 % (ref 34.0–46.6)
Hemoglobin: 13.5 g/dL (ref 11.1–15.9)
Immature Grans (Abs): 0.5 10*3/uL — ABNORMAL HIGH (ref 0.0–0.1)
Immature Granulocytes: 5 %
Lymphocytes Absolute: 3.5 10*3/uL — ABNORMAL HIGH (ref 0.7–3.1)
Lymphs: 34 %
MCH: 30.1 pg (ref 26.6–33.0)
MCHC: 32.8 g/dL (ref 31.5–35.7)
MCV: 92 fL (ref 79–97)
Monocytes Absolute: 0.8 10*3/uL (ref 0.1–0.9)
Monocytes: 8 %
Neutrophils Absolute: 5 10*3/uL (ref 1.4–7.0)
Neutrophils: 49 %
Platelets: 284 10*3/uL (ref 150–450)
RBC: 4.49 x10E6/uL (ref 3.77–5.28)
RDW: 12.6 % (ref 11.7–15.4)
WBC: 10.1 10*3/uL (ref 3.4–10.8)

## 2022-03-05 LAB — LIPID PANEL
Chol/HDL Ratio: 3.7 ratio (ref 0.0–4.4)
Cholesterol, Total: 104 mg/dL (ref 100–199)
HDL: 28 mg/dL — ABNORMAL LOW (ref >39)
LDL Chol Calc (NIH): 50 mg/dL (ref 0–99)
Triglycerides: 147 mg/dL (ref 0–149)
VLDL Cholesterol Cal: 26 mg/dL (ref 5–40)

## 2022-03-05 LAB — CMP14+EGFR
ALT: 25 IU/L (ref 0–32)
AST: 30 IU/L (ref 0–40)
Albumin/Globulin Ratio: 1.6 (ref 1.2–2.2)
Albumin: 4.1 g/dL (ref 3.9–4.9)
Alkaline Phosphatase: 90 IU/L (ref 44–121)
BUN/Creatinine Ratio: 14 (ref 12–28)
BUN: 21 mg/dL (ref 8–27)
Bilirubin Total: 0.3 mg/dL (ref 0.0–1.2)
CO2: 25 mmol/L (ref 20–29)
Calcium: 9.3 mg/dL (ref 8.7–10.3)
Chloride: 103 mmol/L (ref 96–106)
Creatinine, Ser: 1.46 mg/dL — ABNORMAL HIGH (ref 0.57–1.00)
Globulin, Total: 2.5 g/dL (ref 1.5–4.5)
Glucose: 103 mg/dL — ABNORMAL HIGH (ref 70–99)
Potassium: 4.5 mmol/L (ref 3.5–5.2)
Sodium: 141 mmol/L (ref 134–144)
Total Protein: 6.6 g/dL (ref 6.0–8.5)
eGFR: 40 mL/min/{1.73_m2} — ABNORMAL LOW (ref >59)

## 2022-03-05 LAB — URINALYSIS, COMPLETE
Bilirubin, UA: NEGATIVE
Glucose, UA: NEGATIVE
Ketones, UA: NEGATIVE
Nitrite, UA: NEGATIVE
Protein,UA: NEGATIVE
RBC, UA: NEGATIVE
Specific Gravity, UA: 1.015 (ref 1.005–1.030)
Urobilinogen, Ur: 0.2 mg/dL (ref 0.2–1.0)
pH, UA: 6.5 (ref 5.0–7.5)

## 2022-03-05 LAB — MICROSCOPIC EXAMINATION
Bacteria, UA: NONE SEEN
Epithelial Cells (non renal): NONE SEEN /hpf (ref 0–10)
RBC, Urine: NONE SEEN /hpf (ref 0–2)
Renal Epithel, UA: NONE SEEN /hpf
WBC, UA: NONE SEEN /hpf (ref 0–5)

## 2022-03-05 MED ORDER — FUROSEMIDE 40 MG PO TABS: 40.0000 mg | ORAL_TABLET | Freq: Every day | ORAL | 1 refills | Status: DC

## 2022-03-05 MED ORDER — ESCITALOPRAM OXALATE 20 MG PO TABS: 20.0000 mg | ORAL_TABLET | Freq: Every day | ORAL | 1 refills | Status: DC

## 2022-03-05 MED ORDER — BUSPIRONE HCL 5 MG PO TABS: 5.0000 mg | ORAL_TABLET | Freq: Three times a day (TID) | ORAL | 5 refills | Status: DC

## 2022-03-05 MED ORDER — ATORVASTATIN CALCIUM 40 MG PO TABS: 40.0000 mg | ORAL_TABLET | Freq: Every day | ORAL | 1 refills | Status: DC

## 2022-03-05 MED ORDER — CETIRIZINE HCL 10 MG PO TABS: 10.0000 mg | ORAL_TABLET | Freq: Every day | ORAL | 11 refills | Status: DC

## 2022-03-05 MED ORDER — BUPROPION HCL ER (XL) 150 MG PO TB24: 150.0000 mg | ORAL_TABLET | Freq: Every day | ORAL | 1 refills | Status: DC

## 2022-03-05 MED ORDER — BENZONATATE 100 MG PO CAPS: 100.0000 mg | ORAL_CAPSULE | Freq: Three times a day (TID) | ORAL | 0 refills | Status: DC | PRN

## 2022-03-05 MED ORDER — OMEPRAZOLE 40 MG PO CPDR: 40.0000 mg | DELAYED_RELEASE_CAPSULE | Freq: Every day | ORAL | 1 refills | Status: DC

## 2022-03-05 MED ORDER — FENOFIBRATE MICRONIZED 134 MG PO CAPS: ORAL_CAPSULE | ORAL | 1 refills | Status: DC

## 2022-03-05 MED ORDER — PRAMIPEXOLE DIHYDROCHLORIDE 0.5 MG PO TABS: 0.5000 mg | ORAL_TABLET | Freq: Three times a day (TID) | ORAL | 1 refills | Status: DC

## 2022-03-05 NOTE — Patient Instructions (Signed)

## 2022-03-05 NOTE — Addendum Note (Signed)
Addended by: Chevis Pretty on: 03/05/2022 10:47 AM   Modules accepted: Orders, Level of Service

## 2022-03-05 NOTE — Progress Notes (Addendum)
Subjective:    Patient ID: Stacie Solis, female    DOB: 1960-10-25, 62 y.o.   MRN: [id number]  Chief Complaint: medical management of chronic issues     HPI:  Stacie Solis is a 62 y.o. who identifies as a female who was assigned female at birth.   Social history: Lives with: husband Work history: retired   Scientist, forensic in today for follow up of the following chronic medical issues:  1. Mixed hyperlipidemia Doe snot really watch diet and does no dedicated exercise. Lab Results  Component Value Date   CHOL 109 09/01/2021   HDL 31 (L) 09/01/2021   LDLCALC 54 09/01/2021   TRIG 136 09/01/2021   CHOLHDL 3.5 09/01/2021     2. Gastroesophageal reflux disease without esophagitis Is on achipex and does not work as well as other meds she has been on  3. Trigeminal neuralgia No complaints of pain today   4. Recurrent major depressive disorder, in full remission ([place]) Is on combination of wellbutrin and lexapro. Says she is doing well right now.    03/05/2022   10:34 AM 09/01/2021   10:14 AM 02/28/2021   12:12 PM  Depression screen PHQ 2/9  Decreased Interest 0 0 1  Down, Depressed, Hopeless 0 0 0  PHQ - 2 Score 0 0 1  Altered sleeping 0 0 1  Tired, decreased energy 0 0 2  Change in appetite 0 0 0  Feeling bad or failure about yourself  0 0 0  Trouble concentrating 0 0 0  Moving slowly or fidgety/restless 0 0 1  Suicidal thoughts 0 0 0  PHQ-9 Score 0 0 5  Difficult doing work/chores Not difficult at all Not difficult at all Somewhat difficult     5. Panic attacks Denies any recent panic attacks.    09/01/2021   10:14 AM 02/28/2021   12:13 PM 08/15/2020    2:15 PM 07/19/2020   11:33 AM  GAD 7 : Generalized Anxiety Score  Nervous, Anxious, on Edge 0 '1 3 1  '$ Control/stop worrying 0 '1 3 1  '$ Worry too much - different things 0 '1 3 1  '$ Trouble relaxing 0 '1 2 1  '$ Restless 0 '1 2 1  '$ Easily annoyed or irritable 0 '1 2 1  '$ Afraid - awful might happen 0 0 1 1  Total GAD 7 Score 0 '6 16 7   '$ Anxiety Difficulty Not difficult at all Not difficult at all Not difficult at all Not difficult at all      6. RLS (restless legs syndrome) Is on mirapex and works well when she is sleeping  7. Chronic midline low back pain without sciatica Back pain is good tyoday  8. Primary insomnia Is on ambien to sleep, but has not been taking it.. Sleeps about  3-4 hours  9. Smoker Smokes over a pack a day.  10. BMI 35.0-35.9,adult Weight is up 3 lbss Wt Readings from Last 3 Encounters:  03/05/22 171 lb (77.6 kg)  09/20/21 168 lb (76.2 kg)  09/01/21 168 lb (76.2 kg)   BMI Readings from Last 3 Encounters:  03/05/22 31.28 kg/m  09/20/21 30.73 kg/m  09/01/21 30.73 kg/m     New complaints: Nasal congestion and cough for several weeks.  No Known Allergies Outpatient Encounter Medications as of 03/05/2022  Medication Sig   albuterol (VENTOLIN HFA) 108 (90 Base) MCG/ACT inhaler INHALE 2 PUFFS EVERY 4 HOURS AS NEEDED   atorvastatin (LIPITOR) 40 MG tablet TAKE 1 TABLET  BY MOUTH EVERY DAY   benzonatate (TESSALON PERLES) 100 MG capsule Take 1 capsule (100 mg total) by mouth 3 (three) times daily as needed for cough.   buPROPion (WELLBUTRIN XL) 150 MG 24 hr tablet TAKE 1 TABLET BY MOUTH EVERY DAY   busPIRone (BUSPAR) 5 MG tablet Take 1 tablet (5 mg total) by mouth 3 (three) times daily.   cholecalciferol (VITAMIN D) 1000 UNITS tablet Take 1,000 Units by mouth daily.   cyclobenzaprine (FLEXERIL) 10 MG tablet TAKE 1 TABLET BY MOUTH EVERYDAY AT BEDTIME   escitalopram (LEXAPRO) 20 MG tablet TAKE 1 TABLET BY MOUTH EVERY DAY   fenofibrate micronized (LOFIBRA) 134 MG capsule TAKE 1 CAPSULE BY MOUTH DAILY BEFORE BREAKFAST.   fish oil-omega-3 fatty acids 1000 MG capsule Take 2 g by mouth 2 (two) times daily.   furosemide (LASIX) 40 MG tablet Take 1 tablet (40 mg total) by mouth daily.   magic mouthwash SOLN Take 5 mLs by mouth daily.   pramipexole (MIRAPEX) 0.5 MG tablet Take 1 tablet (0.5 mg  total) by mouth 3 (three) times daily.   promethazine-dextromethorphan (PROMETHAZINE-DM) 6.25-15 MG/5ML syrup Take 2.5 mLs by mouth 4 (four) times daily as needed for cough.   RABEprazole (ACIPHEX) 20 MG tablet Take 1 tablet (20 mg total) by mouth daily.   triamcinolone (NASACORT) 55 MCG/ACT AERO nasal inhaler PLACE 2 SPRAYS INTO THE NOSE DAILY.   zolpidem (AMBIEN) 10 MG tablet Take 1 tablet (10 mg total) by mouth at bedtime as needed for sleep.   No facility-administered encounter medications on file as of 03/05/2022.    Past Surgical History:  Procedure Laterality Date   BRAIN SURGERY     trig neuralgia   BREAST BIOPSY Right 11/07/2020   fibroadenoma   CHOLECYSTECTOMY      Family History  Problem Relation Age of Onset   Hypertension Mother    Diabetes Mother    Arthritis Mother    Lung cancer Maternal Grandfather       Controlled substance contract: n/a     Review of Systems  Constitutional:  Negative for diaphoresis.  Eyes:  Negative for pain.  Respiratory:  Negative for shortness of breath.   Cardiovascular:  Negative for chest pain, palpitations and leg swelling.  Gastrointestinal:  Negative for abdominal pain.  Endocrine: Negative for polydipsia.  Skin:  Negative for rash.  Neurological:  Negative for dizziness, weakness and headaches.  Hematological:  Does not bruise/bleed easily.  All other systems reviewed and are negative.      Objective:   Physical Exam Vitals and nursing note reviewed.  Constitutional:      General: She is not in acute distress.    Appearance: Normal appearance. She is well-developed.  HENT:     Head: Normocephalic.     Right Ear: Tympanic membrane normal.     Left Ear: Tympanic membrane normal.     Nose: Nose normal.     Mouth/Throat:     Mouth: Mucous membranes are moist.  Eyes:     Pupils: Pupils are equal, round, and reactive to light.  Neck:     Vascular: No carotid bruit or JVD.  Cardiovascular:     Rate and Rhythm:  Normal rate and regular rhythm.     Heart sounds: Normal heart sounds.  Pulmonary:     Effort: Pulmonary effort is normal. No respiratory distress.     Breath sounds: Normal breath sounds. No wheezing or rales.  Chest:     Chest wall:  No tenderness.  Abdominal:     General: Bowel sounds are normal. There is no distension or abdominal bruit.     Palpations: Abdomen is soft. There is no hepatomegaly, splenomegaly, mass or pulsatile mass.     Tenderness: There is no abdominal tenderness.  Musculoskeletal:        General: Normal range of motion.     Cervical back: Normal range of motion and neck supple.     Right lower leg: Edema (1+) present.     Left lower leg: Edema (1+) present.  Lymphadenopathy:     Cervical: No cervical adenopathy.  Skin:    General: Skin is warm and dry.     Comments: Circulatory change to bil lower ext  Neurological:     Mental Status: She is alert and oriented to person, place, and time.     Deep Tendon Reflexes: Reflexes are normal and symmetric.  Psychiatric:        Behavior: Behavior normal.        Thought Content: Thought content normal.        Judgment: Judgment normal.     BP 118/79   Pulse 69   Temp 97.8 F (36.6 C) (Temporal)   Resp 20   Ht '5\' 2"'$  (1.575 m)   Wt 171 lb (77.6 kg)   SpO2 98%   BMI 31.28 kg/m        Assessment & Plan:   GRAYSON WHITE comes in today with chief complaint of Medical Management of Chronic Issues   Diagnosis and orders addressed:  1. Mixed hyperlipidemia Low fat diet. - atorvastatin (LIPITOR) 40 MG tablet; Take 1 tablet (40 mg total) by mouth daily.  Dispense: 90 tablet; Refill: 1 - fenofibrate micronized (LOFIBRA) 134 MG capsule; TAKE 1 CAPSULE BY MOUTH DAILY BEFORE BREAKFAST.  Dispense: 90 capsule; Refill: 1 - CBC with Differential/Platelet - CMP14+EGFR - Lipid panel  2. Gastroesophageal reflux disease without esophagitis Stop achipix and start back on omeprazole daily Avoid spicy foods Do not eat 2  hours prior to bedtime  - omeprazole (PRILOSEC) 40 MG capsule; Take 1 capsule (40 mg total) by mouth daily.  Dispense: 90 capsule; Refill: 1  3. Trigeminal neuralgia  4. Recurrent major depressive disorder, in full remission ([place]) Stress management - buPROPion (WELLBUTRIN XL) 150 MG 24 hr tablet; Take 1 tablet (150 mg total) by mouth daily.  Dispense: 90 tablet; Refill: 1 - escitalopram (LEXAPRO) 20 MG tablet; Take 1 tablet (20 mg total) by mouth daily.  Dispense: 90 tablet; Refill: 1  5. Panic attacks - busPIRone (BUSPAR) 5 MG tablet; Take 1 tablet (5 mg total) by mouth 3 (three) times daily.  Dispense: 90 tablet; Refill: 5  6. RLS (restless legs syndrome) Keeps legs warm at night - pramipexole (MIRAPEX) 0.5 MG tablet; Take 1 tablet (0.5 mg total) by mouth 3 (three) times daily.  Dispense: 270 tablet; Refill: 1  7. Chronic midline low back pain without sciatica Moist heat rest  8. Primary insomnia Bedtime routine Try 1/2 tablet night  9. Smoker Smoking cessation encouraged  10. BMI 35.0-35.9,adult Discussed diet and exercise for person with BMI >25 Will recheck weight in 3-6 months   11. Nasal congestion Continue nasacort nasal spray - cetirizine (ZYRTEC) 10 MG tablet; Take 1 tablet (10 mg total) by mouth daily.  Dispense: 30 tablet; Refill: 11  12. Peripheral edema Elevate legs when sitting - furosemide (LASIX) 40 MG tablet; Take 1 tablet (40 mg total) by mouth daily.  Dispense: 90 tablet; Refill: 1   Labs pending Health Maintenance reviewed Diet and exercise encouraged  Follow up plan: 6 months   [name], FNP

## 2022-03-06 NOTE — Addendum Note (Signed)
Addended by: Rolena Infante on: 03/06/2022 09:49 AM   Modules accepted: Orders

## 2022-03-19 DIAGNOSIS — Z79891 Long term (current) use of opiate analgesic: Secondary | ICD-10-CM | POA: Diagnosis not present

## 2022-03-19 DIAGNOSIS — Z5181 Encounter for therapeutic drug level monitoring: Secondary | ICD-10-CM | POA: Diagnosis not present

## 2022-03-19 DIAGNOSIS — G894 Chronic pain syndrome: Secondary | ICD-10-CM | POA: Diagnosis not present

## 2022-03-19 DIAGNOSIS — M5416 Radiculopathy, lumbar region: Secondary | ICD-10-CM | POA: Diagnosis not present

## 2022-03-19 DIAGNOSIS — M5459 Other low back pain: Secondary | ICD-10-CM | POA: Diagnosis not present

## 2022-03-19 DIAGNOSIS — Z79899 Other long term (current) drug therapy: Secondary | ICD-10-CM | POA: Diagnosis not present

## 2022-03-22 DIAGNOSIS — M5416 Radiculopathy, lumbar region: Secondary | ICD-10-CM | POA: Diagnosis not present

## 2022-03-26 ENCOUNTER — Telehealth: Payer: Self-pay | Admitting: Acute Care

## 2022-03-26 NOTE — Telephone Encounter (Signed)
Stacie Solis an [place] with patient she advised that she did not want to complete CT at the moment due to upcoming back surgery. Wants to wait until that is over and has recovered

## 2022-04-12 DIAGNOSIS — M48062 Spinal stenosis, lumbar region with neurogenic claudication: Secondary | ICD-10-CM | POA: Diagnosis not present

## 2022-04-12 DIAGNOSIS — Z6831 Body mass index (BMI) 31.0-31.9, adult: Secondary | ICD-10-CM | POA: Diagnosis not present

## 2022-04-12 DIAGNOSIS — M4310 Spondylolisthesis, site unspecified: Secondary | ICD-10-CM | POA: Diagnosis not present

## 2022-04-17 ENCOUNTER — Other Ambulatory Visit: Payer: 59

## 2022-04-20 ENCOUNTER — Other Ambulatory Visit: Payer: Self-pay

## 2022-04-20 ENCOUNTER — Ambulatory Visit: Payer: 59 | Attending: Neurosurgery

## 2022-04-20 DIAGNOSIS — M5416 Radiculopathy, lumbar region: Secondary | ICD-10-CM | POA: Diagnosis not present

## 2022-04-20 NOTE — Therapy (Signed)
OUTPATIENT PHYSICAL THERAPY THORACOLUMBAR EVALUATION   Patient Name: Stacie Solis MRN: [id number] DOB:1960-04-25, 62 y.o., female Today's Date: 04/20/2022  END OF SESSION:  PT End of Session - 04/20/22 1005     Visit Number 1    Number of Visits 2    Date for PT Re-Evaluation 05/25/22    PT Start Time 1011   Patient arrived late to her appointment.   PT Stop Time 1050    PT Time Calculation (min) 39 min    Activity Tolerance Patient tolerated treatment well    Behavior During Therapy WFL for tasks assessed/performed             Past Medical History:  Diagnosis Date   Allergy    Anxiety    Chronic back pain    GERD (gastroesophageal reflux disease)    Hyperlipidemia    Past Surgical History:  Procedure Laterality Date   BRAIN SURGERY     trig neuralgia   BREAST BIOPSY Right 11/07/2020   fibroadenoma   CHOLECYSTECTOMY     Patient Active Problem List   Diagnosis Date Noted   Chronic lower back pain 06/08/2014   Smoker 03/03/2014   BMI 35.0-35.9,adult 03/03/2014   RLS (restless legs syndrome) 03/03/2014   Insomnia 03/03/2014   Depression 01/05/2013   GERD (gastroesophageal reflux disease) 09/11/2012   Hyperlipidemia 09/11/2012   Panic attacks 09/11/2012   Trigeminal neuralgia 09/11/2012   REFERRING PROVIDER: Earnie Larsson, MD   REFERRING DIAG: Spondylolisthesis, site unspecified   Rationale for Evaluation and Treatment: Rehabilitation  THERAPY DIAG:  Radiculopathy, lumbar region  ONSET DATE: 10+ years  SUBJECTIVE:                                                                                                                                                                                           SUBJECTIVE STATEMENT: Patient reports that her back has been bothering her for a long time, but it has gotten worse in the last 6 months. She had been receiving injections, but they have stopped helping. She was told that therapy was not going to help, but she  needs to do therapy prior to having surgery.   PERTINENT HISTORY:  Depression, allergies, anxiety, and current smoker  PAIN:  Are you having pain? Yes: NPRS scale: 6-7/10 Pain location: back radiating legs (R>L) and right arm Pain description: heavy,  Aggravating factors: standing or walk (30 minutes at most),  Relieving factors: none known  PRECAUTIONS: None  WEIGHT BEARING RESTRICTIONS: No  FALLS:  Has patient fallen in last 6 months? No and has noticed that her balance is not as good and  she has needed to catch herself  LIVING ENVIRONMENT: Lives with: lives with their family Lives in: House/apartment Stairs: Yes: Internal: 15 steps; on left going up; step to pattern Has following equipment at home: None  OCCUPATION: retired  PLOF: Independent  PATIENT GOALS: be able to fish, hike, stand longer, and sleep longer (3 hours at most currently)  NEXT MD VISIT: 05/10/22  OBJECTIVE:   SCREENING FOR RED FLAGS: Bowel or bladder incontinence: No Spinal tumors: No Cauda equina syndrome: No Compression fracture: No Abdominal aneurysm: No  COGNITION: Overall cognitive status: Within functional limits for tasks assessed     SENSATION: Patient reports that she just noticed tingling in both hands that began last night. She notes that this lasted about 30 minutes.   POSTURE: forward head and decreased lumbar lordosis  PALPATION: TTP: bilateral lumbar paraspinals, and right QL  LUMBAR ROM:   AROM eval  Flexion 58; pulling in bilateral hasmtrings  Extension 6  Right lateral flexion 25% limited  Left lateral flexion 25% limited; familiar low back pain  Right rotation 25% limited  Left rotation 25% limited   (Blank rows = not tested)  LOWER EXTREMITY ROM: WFL for activities assessed  LOWER EXTREMITY MMT:    MMT Right eval Left eval  Hip flexion 4/5; pain radiating to knee  4/5  Hip extension    Hip abduction    Hip adduction    Hip internal rotation    Hip  external rotation    Knee flexion 4-/5; referred pain into the foot 4/5; pain along hamstring  Knee extension 4-/5; limited by tenderness  5/5; lateral thigh pain   Ankle dorsiflexion 4/5 4/5  Ankle plantarflexion    Ankle inversion    Ankle eversion     (Blank rows = not tested)  GAIT: Assistive device utilized: None Level of assistance: Complete Independence Comments: Decreased gait speed and stride length  TODAY'S TREATMENT:                                                                                                                              DATE:     PATIENT EDUCATION:  Education details: Plan of care, prognosis, healing after surgery, and goals for therapy Person educated: Patient Education method: Explanation Education comprehension: verbalized understanding  HOME EXERCISE PROGRAM:   ASSESSMENT:  CLINICAL IMPRESSION: Patient is a 62 y.o. female who was seen today for physical therapy evaluation and treatment for chronic low back pain with radiating pain into bilateral lower extremities.  She presented with moderate pain severity and irritability with lower extremity manual muscle testing reproducing her familiar pain.  Recommend she continue with physical therapy to address her impairments to maximize her functional mobility.  OBJECTIVE IMPAIRMENTS: Abnormal gait, decreased activity tolerance, decreased balance, decreased mobility, difficulty walking, decreased ROM, decreased strength, hypomobility, impaired tone, postural dysfunction, and pain.   ACTIVITY LIMITATIONS: carrying, lifting, bending, sitting, standing, squatting, sleeping, stairs, transfers, and locomotion level  PARTICIPATION LIMITATIONS: meal prep, cleaning, laundry, shopping, community activity, and yard work  PERSONAL FACTORS: Past/current experiences, Time since onset of injury/illness/exacerbation, and 3+ comorbidities: Depression, allergies, anxiety, and current smoker  are also affecting  patient's functional outcome.   REHAB POTENTIAL: Fair    CLINICAL DECISION MAKING: Evolving/moderate complexity  EVALUATION COMPLEXITY: Moderate   GOALS: Goals reviewed with patient? Yes  LONG TERM GOALS: Target date: 04/27/22  Patient will be independent with her HEP. Baseline:  Goal status: INITIAL  PLAN:  PT FREQUENCY: 1x/week  PT DURATION: 1 week  PLANNED INTERVENTIONS: Therapeutic exercises, Therapeutic activity, Neuromuscular re-education, Balance training, Gait training, Patient/Family education, Self Care, Joint mobilization, Stair training, Electrical stimulation, Spinal mobilization, Cryotherapy, Moist heat, Manual therapy, and Re-evaluation.  PLAN FOR NEXT SESSION: Provide HEP to include isometrics, lumbar active range of motion, and lower extremity strengthening prior to lumbar surgery with modalities as needed   Darlin Coco, PT 04/20/2022, 12:54 PM

## 2022-04-25 ENCOUNTER — Ambulatory Visit: Payer: 59 | Admitting: Physical Therapy

## 2022-04-25 ENCOUNTER — Encounter: Payer: Self-pay | Admitting: Physical Therapy

## 2022-04-25 DIAGNOSIS — M5416 Radiculopathy, lumbar region: Secondary | ICD-10-CM

## 2022-04-25 NOTE — Therapy (Addendum)
OUTPATIENT PHYSICAL THERAPY THORACOLUMBAR EVALUATION   Patient Name: Stacie Solis MRN: [id number] DOB:08-Feb-1961, 62 y.o., female Today's Date: 04/25/2022  END OF SESSION:  PT End of Session - 04/25/22 1040     Visit Number 2    Number of Visits 2    Date for PT Re-Evaluation 05/25/22    PT Start Time 1046    PT Stop Time 1115    PT Time Calculation (min) 29 min    Activity Tolerance Patient tolerated treatment well    Behavior During Therapy [facility] for tasks assessed/performed            Past Medical History:  Diagnosis Date   Allergy    Anxiety    Chronic back pain    GERD (gastroesophageal reflux disease)    Hyperlipidemia    Past Surgical History:  Procedure Laterality Date   BRAIN SURGERY     trig neuralgia   BREAST BIOPSY Right 11/07/2020   fibroadenoma   CHOLECYSTECTOMY     Patient Active Problem List   Diagnosis Date Noted   Chronic lower back pain 06/08/2014   Smoker 03/03/2014   BMI 35.0-35.9,adult 03/03/2014   RLS (restless legs syndrome) 03/03/2014   Insomnia 03/03/2014   Depression 01/05/2013   GERD (gastroesophageal reflux disease) 09/11/2012   Hyperlipidemia 09/11/2012   Panic attacks 09/11/2012   Trigeminal neuralgia 09/11/2012   REFERRING PROVIDER: Earnie Larsson, MD   REFERRING DIAG: Spondylolisthesis, site unspecified   Rationale for Evaluation and Treatment: Rehabilitation  THERAPY DIAG:  Radiculopathy, lumbar region  ONSET DATE: 10+ years  SUBJECTIVE:                                                                                                                                                                                           SUBJECTIVE STATEMENT: Reports that she is to return to MD 05/11/22 to schedule surgery. Has continued RUE pain and RLE pain. Pain is high even with pain meds.  PERTINENT HISTORY:  Depression, allergies, anxiety, and current smoker  PAIN:  Are you having pain? Yes: NPRS scale: 3-4/10 Pain location:  back radiating legs (R>L) and right arm Pain description: heavy,  Aggravating factors: standing or walk (30 minutes at most),  Relieving factors: none known  PRECAUTIONS: None  WEIGHT BEARING RESTRICTIONS: No  FALLS:  Has patient fallen in last 6 months? No and has noticed that her balance is not as good and she has needed to catch herself  LIVING ENVIRONMENT: Lives with: lives with their family Lives in: House/apartment Stairs: Yes: Internal: 15 steps; on left going up; step to pattern Has following equipment at home: None  OCCUPATION: retired  PLOF: Independent  PATIENT GOALS: be able to fish, hike, stand longer, and sleep longer (3 hours at most currently)  NEXT MD VISIT: 05/10/22  OBJECTIVE:    SENSATION: Patient reports that she just noticed tingling in both hands that began last night. She notes that this lasted about 30 minutes.   POSTURE: forward head and decreased lumbar lordosis  PALPATION: TTP: bilateral lumbar paraspinals, and right QL  LUMBAR ROM:   AROM eval  Flexion 58; pulling in bilateral hasmtrings  Extension 6  Right lateral flexion 25% limited  Left lateral flexion 25% limited; familiar low back pain  Right rotation 25% limited  Left rotation 25% limited   (Blank rows = not tested)  LOWER EXTREMITY ROM: WFL for activities assessed  LOWER EXTREMITY MMT:    MMT Right eval Left eval  Hip flexion 4/5; pain radiating to knee  4/5  Hip extension    Hip abduction    Hip adduction    Hip internal rotation    Hip external rotation    Knee flexion 4-/5; referred pain into the foot 4/5; pain along hamstring  Knee extension 4-/5; limited by tenderness  5/5; lateral thigh pain   Ankle dorsiflexion 4/5 4/5  Ankle plantarflexion    Ankle inversion    Ankle eversion     (Blank rows = not tested)  GAIT: Assistive device utilized: None Level of assistance: Complete Independence Comments: Decreased gait speed and stride length  TODAY'S  TREATMENT:                                                                                                                              DATE:                      2/28      EXERCISE LOG  Exercise Repetitions and Resistance Comments  Core press in sitting X15 reps 5 sec holds   LAQ X20 reps   Marching X15 reps   Seated HS stretch 5x15 sec holds    Seated figure 4 stretch Attempted but no stretch   Seated SKTC 5x15 sec holds   Clam Yellow x20 reps    Blank cell = exercise not performed today   PATIENT EDUCATION:  Education details: Plan of care, prognosis, healing after surgery, and goals for therapy Person educated: Patient Education method: Explanation Education comprehension: verbalized understanding  HOME EXERCISE PROGRAM:  ASSESSMENT:  CLINICAL IMPRESSION: Patient presented in clinic for an HEP until she could have lumbar surgery. Patient limited with ADLs and sleep due to pain. Patient limited with therex for pain and reps limited generally due to pain. Patient changing positions such as sitting and standing to control pain. Patient provided HEP with instruction and patient verbalizing understanding of instruction.  PHYSICAL THERAPY DISCHARGE SUMMARY  Visits from Start of Care: 2  Current functional level related to goals / functional outcomes: Patient was able to achieve  her goals for skilled physical therapy. However, she continues to be significantly limited by high pain levels.    Remaining deficits: Pain    Education / Equipment: HEP    Patient agrees to discharge. Patient goals were met. Patient is being discharged due to the patient's request.  Jacqulynn Cadet, PT, DPT    OBJECTIVE IMPAIRMENTS: Abnormal gait, decreased activity tolerance, decreased balance, decreased mobility, difficulty walking, decreased ROM, decreased strength, hypomobility, impaired tone, postural dysfunction, and pain.   ACTIVITY LIMITATIONS: carrying, lifting, bending, sitting, standing,  squatting, sleeping, stairs, transfers, and locomotion level  PARTICIPATION LIMITATIONS: meal prep, cleaning, laundry, shopping, community activity, and yard work  PERSONAL FACTORS: Past/current experiences, Time since onset of injury/illness/exacerbation, and 3+ comorbidities: Depression, allergies, anxiety, and current smoker  are also affecting patient's functional outcome.   REHAB POTENTIAL: Fair    CLINICAL DECISION MAKING: Evolving/moderate complexity  EVALUATION COMPLEXITY: Moderate  GOALS: Goals reviewed with patient? Yes  LONG TERM GOALS: Target date: 04/27/22  Patient will be independent with her HEP. Baseline:  Goal status: Achieved  PLAN:  PT FREQUENCY: 1x/week  PT DURATION: 1 week  PLANNED INTERVENTIONS: Therapeutic exercises, Therapeutic activity, Neuromuscular re-education, Balance training, Gait training, Patient/Family education, Self Care, Joint mobilization, Stair training, Electrical stimulation, Spinal mobilization, Cryotherapy, Moist heat, Manual therapy, and Re-evaluation.  PLAN FOR NEXT SESSION: DC to HEP  Standley Brooking, PTA 04/25/2022, 11:35 AM

## 2022-05-02 ENCOUNTER — Other Ambulatory Visit: Payer: Self-pay | Admitting: Nurse Practitioner

## 2022-05-10 DIAGNOSIS — M4310 Spondylolisthesis, site unspecified: Secondary | ICD-10-CM | POA: Diagnosis not present

## 2022-05-10 DIAGNOSIS — Z6833 Body mass index (BMI) 33.0-33.9, adult: Secondary | ICD-10-CM | POA: Diagnosis not present

## 2022-05-15 ENCOUNTER — Other Ambulatory Visit: Payer: Self-pay | Admitting: Neurosurgery

## 2022-05-25 ENCOUNTER — Other Ambulatory Visit: Payer: Self-pay | Admitting: Nurse Practitioner

## 2022-06-08 DIAGNOSIS — M4316 Spondylolisthesis, lumbar region: Secondary | ICD-10-CM | POA: Diagnosis not present

## 2022-06-08 NOTE — Pre-Procedure Instructions (Signed)
Surgical Instructions    Your procedure is scheduled on Tuesday, April 23rd.  Report to [facility] Main Entrance "A" at 06:00 A.M., then check in with the Admitting office.  Call this number if you have problems the morning of surgery:  [phone]  If you have any questions prior to your surgery date call [phone]: Open Monday-Friday 8am-4pm If you experience any cold or flu symptoms such as cough, fever, chills, shortness of breath, etc. between now and your scheduled surgery, please notify us at the above number.     Remember:  Do not eat or drink after midnight the night before your surgery     Take these medicines the morning of surgery with A SIP OF WATER  atorvastatin (LIPITOR)  buPROPion (WELLBUTRIN XL)  busPIRone (BUSPAR)  cetirizine (ZYRTEC)  escitalopram (LEXAPRO)  fenofibrate micronized (LOFIBRA)  metaxalone (SKELAXIN)  omeprazole (PRILOSEC)  pramipexole (MIRAPEX)  triamcinolone (NASACORT)    If needed: albuterol (VENTOLIN HFA)- if needed, bring inhaler with you on day of surgery PERCOCET     As of today, STOP taking any Aspirin (unless otherwise instructed by your surgeon) Aleve, Naproxen, Ibuprofen, Motrin, Advil, Goody's, BC's, all herbal medications, fish oil, meloxicam (MOBIC) and all vitamins.                     Do NOT Smoke (Tobacco/Vaping) for 24 hours prior to your procedure.  If you use a CPAP at night, you may bring your mask/headgear for your overnight stay.   Contacts, glasses, piercing's, hearing aid's, dentures or partials may not be worn into surgery, please bring cases for these belongings.    For patients admitted to the hospital, discharge time will be determined by your treatment team.   Patients discharged the day of surgery will not be allowed to drive home, and someone needs to stay with them for 24 hours.  SURGICAL WAITING ROOM VISITATION Patients having surgery or a procedure may have no more than 2 support people in the  waiting area - these visitors may rotate.   Children under the age of 66 must have an adult with them who is not the patient. If the patient needs to stay at the hospital during part of their recovery, the visitor guidelines for inpatient rooms apply. Pre-op nurse will coordinate an appropriate time for 1 support person to accompany patient in pre-op.  This support person may not rotate.   Please refer to the [facility] website for the visitor guidelines for Inpatients (after your surgery is over and you are in a regular room).      If you received a COVID test during your pre-op visit  it is requested that you wear a mask when out in public, stay away from anyone that may not be feeling well and notify your surgeon if you develop symptoms. If you have been in contact with anyone that has tested positive in the last 10 days please notify you surgeon.

## 2022-06-11 ENCOUNTER — Encounter (HOSPITAL_COMMUNITY): Payer: Self-pay

## 2022-06-11 ENCOUNTER — Other Ambulatory Visit: Payer: Self-pay

## 2022-06-11 ENCOUNTER — Encounter (HOSPITAL_COMMUNITY)
Admission: RE | Admit: 2022-06-11 | Discharge: 2022-06-11 | Disposition: A | Discharge status: Home / Self Care | Payer: 59 | Source: Ambulatory Visit | Attending: Neurosurgery | Admitting: Neurosurgery

## 2022-06-11 VITALS — BP 113/73 | HR 79 | Temp 97.8°F | Resp 18 | Ht 62.0 in | Wt 184.4 lb

## 2022-06-11 DIAGNOSIS — F419 Anxiety disorder, unspecified: Secondary | ICD-10-CM | POA: Insufficient documentation

## 2022-06-11 DIAGNOSIS — F17200 Nicotine dependence, unspecified, uncomplicated: Secondary | ICD-10-CM | POA: Insufficient documentation

## 2022-06-11 DIAGNOSIS — E669 Obesity, unspecified: Secondary | ICD-10-CM | POA: Insufficient documentation

## 2022-06-11 DIAGNOSIS — Z79899 Other long term (current) drug therapy: Secondary | ICD-10-CM | POA: Insufficient documentation

## 2022-06-11 DIAGNOSIS — Z6833 Body mass index (BMI) 33.0-33.9, adult: Secondary | ICD-10-CM | POA: Insufficient documentation

## 2022-06-11 DIAGNOSIS — Z01812 Encounter for preprocedural laboratory examination: Secondary | ICD-10-CM | POA: Insufficient documentation

## 2022-06-11 DIAGNOSIS — F32A Depression, unspecified: Secondary | ICD-10-CM | POA: Diagnosis not present

## 2022-06-11 DIAGNOSIS — K219 Gastro-esophageal reflux disease without esophagitis: Secondary | ICD-10-CM | POA: Diagnosis not present

## 2022-06-11 DIAGNOSIS — Z01818 Encounter for other preprocedural examination: Secondary | ICD-10-CM

## 2022-06-11 DIAGNOSIS — M4316 Spondylolisthesis, lumbar region: Secondary | ICD-10-CM | POA: Diagnosis not present

## 2022-06-11 HISTORY — DX: Depression, unspecified: F32.A

## 2022-06-11 HISTORY — DX: Unspecified osteoarthritis, unspecified site: M19.90

## 2022-06-11 LAB — BASIC METABOLIC PANEL
Anion gap: 11 (ref 5–15)
BUN: 25 mg/dL — ABNORMAL HIGH (ref 8–23)
CO2: 26 mmol/L (ref 22–32)
Calcium: 8.8 mg/dL — ABNORMAL LOW (ref 8.9–10.3)
Chloride: 102 mmol/L (ref 98–111)
Creatinine, Ser: 1.66 mg/dL — ABNORMAL HIGH (ref 0.44–1.00)
GFR, Estimated: 34 mL/min — ABNORMAL LOW (ref >60)
Glucose, Bld: 133 mg/dL — ABNORMAL HIGH (ref 70–99)
Potassium: 3.8 mmol/L (ref 3.5–5.1)
Sodium: 139 mmol/L (ref 135–145)

## 2022-06-11 LAB — CBC
HCT: 40.6 % (ref 36.0–46.0)
Hemoglobin: 13 g/dL (ref 12.0–15.0)
MCH: 30 pg (ref 26.0–34.0)
MCHC: 32 g/dL (ref 30.0–36.0)
MCV: 93.5 fL (ref 80.0–100.0)
Platelets: 162 10*3/uL (ref 150–400)
RBC: 4.34 MIL/uL (ref 3.87–5.11)
RDW: 13.6 % (ref 11.5–15.5)
WBC: 4.9 10*3/uL (ref 4.0–10.5)
nRBC: 0 % (ref 0.0–0.2)

## 2022-06-11 LAB — TYPE AND SCREEN
ABO/RH(D): A POS
Antibody Screen: NEGATIVE

## 2022-06-11 LAB — SURGICAL PCR SCREEN
MRSA, PCR: NEGATIVE
Staphylococcus aureus: NEGATIVE

## 2022-06-11 NOTE — Progress Notes (Signed)
PCP - Daphine Deutscher, Mary-Margaret Cardiologist - denies  PPM/ICD - denies Device Orders - n/a Rep Notified - n/a  Chest x-ray - 09/20/2021 EKG - n/a Stress Test - denies ECHO - denies Cardiac Cath - denies  Sleep Study - denies CPAP - n/a  Fasting Blood Sugar - no DM   Last dose of GLP1 agonist- n/a  GLP1 instructions: n/a  Blood Thinner Instructions: n/a Aspirin Instructions: n/a  ERAS Protcol - no; NPO at midnight before surgery PRE-SURGERY Ensure or G2- no  COVID TEST- n/a  Pt takes Lasix for peripheral edema that she has for years. States that is not related to any heart problems.  Anesthesia review: no  Patient denies shortness of breath, fever, cough and chest pain at PAT appointment or the last two months.   All instructions explained to the patient, with a verbal understanding of the material. Patient agrees to go over the instructions while at home for a better understanding. Patient also instructed to self quarantine after being tested for COVID-19. The opportunity to ask questions was provided.

## 2022-06-12 ENCOUNTER — Encounter: Payer: Self-pay | Admitting: *Deleted

## 2022-06-12 ENCOUNTER — Encounter: Payer: Self-pay | Admitting: Medical

## 2022-06-12 NOTE — Anesthesia Preprocedure Evaluation (Deleted)
Anesthesia Evaluation    Airway        Dental   Pulmonary Current Smoker          Cardiovascular      Neuro/Psych    GI/Hepatic   Endo/Other    Renal/GU      Musculoskeletal   Abdominal   Peds  Hematology   Anesthesia Other Findings   Reproductive/Obstetrics                              Anesthesia Physical Anesthesia Plan  ASA:   Anesthesia Plan:    Post-op Pain Management:    Induction:   PONV Risk Score and Plan:   Airway Management Planned:   Additional Equipment:   Intra-op Plan:   Post-operative Plan:   Informed Consent:   Plan Discussed with:   Anesthesia Plan Comments: (See PAT note from 9/15. Pt had previous difficulty with extubation likely due to OSA on 05/01/2017 after back surgery at [facility]. Meds given are available for review in CareEverywhere)         Anesthesia Quick Evaluation

## 2022-06-12 NOTE — Progress Notes (Signed)
Case: [id number] Date/Time: 06/19/22 0745   Procedure: PLIF - L2-L3 - L3-L4 - L4-L5 (Back)   Anesthesia type: General   Pre-op diagnosis: Spondylolisthesis   Location: [facility] ROOM 20 / [facility] OR   Surgeons: Julio Sicks, MD       DISCUSSION: Stacie Solis is a 62 year old female who presents to PAT clinic for surgery listed above on 4/23.  Past medical history significant for obesity, GERD, anxiety and depression.  Patient is an everyday smoker.  Of note prior anesthesia complications include difficulty being extubated due to high ETCO2 requiring BiPAP and lengthy PACU stay after extubation in March 2019 during back surgery at [facility].  It was thought that patient likely had some degree of sleep apnea however does not appear that patient ever followed up regarding this or received a sleep study.  She is an ASA 3.  Vital signs at PAT visit within normal limits.  Labs are notable for elevated serum creatinine which is slightly higher than baseline.  Patient is prescribed Lasix for peripheral edema which may be contributing.   VS: BP 113/73   Pulse 79   Temp 36.6 C (Oral)   Resp 18   Ht  (1.575 m)   Wt 83.6 kg   SpO2 99%   BMI 33.73 kg/m   PROVIDERS: Bennie Pierini, FNP   LABS: Labs reviewed: Acceptable for surgery. (all labs ordered are listed, but only abnormal results are displayed)  Labs Reviewed  BASIC METABOLIC PANEL - Abnormal; Notable for the following components:      Result Value   Glucose, Bld 133 (*)    BUN 25 (*)    Creatinine, Ser 1.66 (*)    Calcium 8.8 (*)    GFR, Estimated 34 (*)    All other components within normal limits  SURGICAL PCR SCREEN  CBC  TYPE AND SCREEN     IMAGES: n/a   EKG; n/a   CV: n/a  Past Medical History:  Diagnosis Date   Allergy    Anxiety    Arthritis    Chronic back pain    Depression    GERD (gastroesophageal reflux disease)    Hyperlipidemia     Past Surgical History:  Procedure Laterality Date   BRAIN  SURGERY     trig neuralgia   BREAST BIOPSY Right 11/07/2020   fibroadenoma   CHOLECYSTECTOMY      MEDICATIONS:  albuterol (VENTOLIN HFA) 108 (90 Base) MCG/ACT inhaler   atorvastatin (LIPITOR) 40 MG tablet   benzonatate (TESSALON PERLES) 100 MG capsule   buPROPion (WELLBUTRIN XL) 150 MG 24 hr tablet   busPIRone (BUSPAR) 5 MG tablet   cetirizine (ZYRTEC) 10 MG tablet   Cholecalciferol (VITAMIN D) 50 MCG (2000 UT) tablet   escitalopram (LEXAPRO) 20 MG tablet   fenofibrate micronized (LOFIBRA) 134 MG capsule   fish oil-omega-3 fatty acids 1000 MG capsule   furosemide (LASIX) 40 MG tablet   meloxicam (MOBIC) 15 MG tablet   metaxalone (SKELAXIN) 800 MG tablet   omeprazole (PRILOSEC) 40 MG capsule   PERCOCET 5-325 MG tablet   pramipexole (MIRAPEX) 0.5 MG tablet   triamcinolone (NASACORT) 55 MCG/ACT AERO nasal inhaler   Vaginal Lubricant (REPLENS) GEL   zolpidem (AMBIEN) 10 MG tablet   No current facility-administered medications for this encounter.   Marcille Blanco [facility] Surgical Short Stay/Anesthesiology [facility] Phone [phone] 06/12/2022 3:55 PM

## 2022-06-19 ENCOUNTER — Inpatient Hospital Stay (HOSPITAL_COMMUNITY): Admission: RE | Admit: 2022-06-19 | Payer: 59 | Source: Home / Self Care | Admitting: Neurosurgery

## 2022-06-19 SURGERY — POSTERIOR LUMBAR FUSION 3 LEVEL
Anesthesia: General | Site: Back

## 2022-06-27 ENCOUNTER — Other Ambulatory Visit: Payer: Self-pay | Admitting: Nurse Practitioner

## 2022-07-04 DIAGNOSIS — Z720 Tobacco use: Secondary | ICD-10-CM | POA: Diagnosis not present

## 2022-07-04 DIAGNOSIS — M5136 Other intervertebral disc degeneration, lumbar region: Secondary | ICD-10-CM | POA: Diagnosis not present

## 2022-07-04 DIAGNOSIS — M5416 Radiculopathy, lumbar region: Secondary | ICD-10-CM | POA: Diagnosis not present

## 2022-07-04 DIAGNOSIS — M48062 Spinal stenosis, lumbar region with neurogenic claudication: Secondary | ICD-10-CM | POA: Diagnosis not present

## 2022-07-04 DIAGNOSIS — G894 Chronic pain syndrome: Secondary | ICD-10-CM | POA: Diagnosis not present

## 2022-07-04 DIAGNOSIS — Z79891 Long term (current) use of opiate analgesic: Secondary | ICD-10-CM | POA: Diagnosis not present

## 2022-08-07 ENCOUNTER — Encounter: Payer: Self-pay | Admitting: Family

## 2022-08-07 ENCOUNTER — Telehealth (INDEPENDENT_AMBULATORY_CARE_PROVIDER_SITE_OTHER): Payer: 59 | Admitting: Family

## 2022-08-07 DIAGNOSIS — R509 Fever, unspecified: Secondary | ICD-10-CM

## 2022-08-07 DIAGNOSIS — R6889 Other general symptoms and signs: Secondary | ICD-10-CM

## 2022-08-07 DIAGNOSIS — R0981 Nasal congestion: Secondary | ICD-10-CM

## 2022-08-07 DIAGNOSIS — F17200 Nicotine dependence, unspecified, uncomplicated: Secondary | ICD-10-CM

## 2022-08-07 MED ORDER — BENZONATATE 200 MG PO CAPS
200.0000 mg | ORAL_CAPSULE | Freq: Three times a day (TID) | ORAL | 1 refills | Status: DC | PRN
Start: 2022-08-07 — End: 2022-09-03

## 2022-08-07 MED ORDER — CETIRIZINE HCL 10 MG PO TABS
10.0000 mg | ORAL_TABLET | Freq: Every day | ORAL | 11 refills | Status: DC
Start: 2022-08-07 — End: 2023-09-02

## 2022-08-07 MED ORDER — FLUTICASONE PROPIONATE 50 MCG/ACT NA SUSP
2.0000 | Freq: Every day | NASAL | 6 refills | Status: AC
Start: 2022-08-07 — End: ?

## 2022-08-07 MED ORDER — PROMETHAZINE-DM 6.25-15 MG/5ML PO SYRP
5.0000 mL | ORAL_SOLUTION | Freq: Three times a day (TID) | ORAL | 0 refills | Status: AC | PRN
Start: 2022-08-07 — End: ?

## 2022-08-07 NOTE — Progress Notes (Signed)
Virtual Visit Consent   Stacie Solis, you are scheduled for a virtual visit with a [facility] provider today. Just as with appointments in the office, your consent must be obtained to participate. Your consent will be active for this visit and any virtual visit you may have with one of our providers in the next 365 days. If you have a MyChart account, a copy of this consent can be sent to you electronically.  As this is a virtual visit, video technology does not allow for your provider to perform a traditional examination. This may limit your provider's ability to fully assess your condition. If your provider identifies any concerns that need to be evaluated in person or the need to arrange testing (such as labs, EKG, etc.), we will make arrangements to do so. Although advances in technology are sophisticated, we cannot ensure that it will always work on either your end or our end. If the connection with a video visit is poor, the visit may have to be switched to a telephone visit. With either a video or telephone visit, we are not always able to ensure that we have a secure connection.  By engaging in this virtual visit, you consent to the provision of healthcare and authorize for your insurance to be billed (if applicable) for the services provided during this visit. Depending on your insurance coverage, you may receive a charge related to this service.  I need to obtain your verbal consent now. Are you willing to proceed with your visit today? Stacie Solis has provided verbal consent on 08/07/2022 for a virtual visit (video or telephone). Stacie Rodney, FNP  Date: 08/07/2022 4:54 PM  Virtual Visit via Video Note   I, Stacie Solis, connected with  Stacie Solis  ([id number], [birth year]) on 08/07/22 at  5:15 PM EDT by a video-enabled telemedicine application and verified that I am speaking with the correct person using two identifiers.  Location: Patient: Virtual Visit Location Patient:  Home Provider: Virtual Visit Location Provider: Home Office   I discussed the limitations of evaluation and management by telemedicine and the availability of in person appointments. The patient expressed understanding and agreed to proceed.    History of Present Illness: Stacie Solis is a 62 y.o. who identifies as a female who was assigned female at birth, and is being seen today for headache, fever, and body aches. She did a home COVID that was negative.   HPI: Fever  This is a new problem. The current episode started yesterday. The problem occurs intermittently. The problem has been unchanged. The maximum temperature noted was 102 to 102.9 F. Associated symptoms include congestion, coughing, ear pain, headaches, muscle aches and sleepiness. Pertinent negatives include no sore throat. She has tried acetaminophen and NSAIDs for the symptoms. The treatment provided mild relief.    Problems:  Patient Active Problem List   Diagnosis Date Noted   Chronic lower back pain 06/08/2014   Smoker 03/03/2014   BMI 35.0-35.9,adult 03/03/2014   RLS (restless legs syndrome) 03/03/2014   Insomnia 03/03/2014   Depression 01/05/2013   GERD (gastroesophageal reflux disease) 09/11/2012   Hyperlipidemia 09/11/2012   Panic attacks 09/11/2012   Trigeminal neuralgia 09/11/2012    Allergies:  Allergies  Allergen Reactions   Sulfa Antibiotics Nausea Only   Medications:  Current Outpatient Medications:    albuterol (VENTOLIN HFA) 108 (90 Base) MCG/ACT inhaler, INHALE 2 PUFFS EVERY 4 HOURS AS NEEDED, Disp: 8.5 each, Rfl: 2   atorvastatin (LIPITOR)  40 MG tablet, Take 1 tablet (40 mg total) by mouth daily., Disp: 90 tablet, Rfl: 1   benzonatate (TESSALON) 200 MG capsule, Take 1 capsule (200 mg total) by mouth 3 (three) times daily as needed., Disp: 30 capsule, Rfl: 1   buPROPion (WELLBUTRIN XL) 150 MG 24 hr tablet, Take 1 tablet (150 mg total) by mouth daily., Disp: 90 tablet, Rfl: 1   busPIRone (BUSPAR) 5 MG  tablet, Take 1 tablet (5 mg total) by mouth 3 (three) times daily., Disp: 90 tablet, Rfl: 5   cetirizine (ZYRTEC) 10 MG tablet, Take 1 tablet (10 mg total) by mouth daily., Disp: 30 tablet, Rfl: 11   Cholecalciferol (VITAMIN D) 50 MCG (2000 UT) tablet, Take 2,000 Units by mouth daily., Disp: , Rfl:    escitalopram (LEXAPRO) 20 MG tablet, Take 1 tablet (20 mg total) by mouth daily., Disp: 90 tablet, Rfl: 1   fenofibrate micronized (LOFIBRA) 134 MG capsule, TAKE 1 CAPSULE BY MOUTH DAILY BEFORE BREAKFAST., Disp: 90 capsule, Rfl: 1   fish oil-omega-3 fatty acids 1000 MG capsule, Take 1 g by mouth daily., Disp: , Rfl:    fluticasone (FLONASE) 50 MCG/ACT nasal spray, Place 2 sprays into both nostrils daily., Disp: 16 g, Rfl: 6   furosemide (LASIX) 40 MG tablet, Take 1 tablet (40 mg total) by mouth daily., Disp: 90 tablet, Rfl: 1   meloxicam (MOBIC) 15 MG tablet, Take 15 mg by mouth daily as needed for pain., Disp: , Rfl:    metaxalone (SKELAXIN) 800 MG tablet, Take 800 mg by mouth 3 (three) times daily., Disp: , Rfl:    omeprazole (PRILOSEC) 40 MG capsule, Take 1 capsule (40 mg total) by mouth daily., Disp: 90 capsule, Rfl: 1   PERCOCET 5-325 MG tablet, Take 1 tablet by mouth 4 (four) times daily as needed for moderate pain., Disp: , Rfl:    pramipexole (MIRAPEX) 0.5 MG tablet, Take 1 tablet (0.5 mg total) by mouth 3 (three) times daily., Disp: 270 tablet, Rfl: 1   promethazine-dextromethorphan (PROMETHAZINE-DM) 6.25-15 MG/5ML syrup, Take 5 mLs by mouth 3 (three) times daily as needed for cough., Disp: 118 mL, Rfl: 0   triamcinolone (NASACORT) 55 MCG/ACT AERO nasal inhaler, PLACE 2 SPRAYS INTO THE NOSE DAILY., Disp: 16.9 each, Rfl: 0   Vaginal Lubricant (REPLENS) GEL, Place 1 Application vaginally daily as needed (moisture/irritation)., Disp: , Rfl:    zolpidem (AMBIEN) 10 MG tablet, Take 1 tablet (10 mg total) by mouth at bedtime as needed for sleep., Disp: 30 tablet, Rfl:  5  Observations/Objective: Patient is well-developed, well-nourished in no acute distress.  Resting comfortably  at home.  Head is normocephalic, atraumatic.  No labored breathing.  Speech is clear and coherent with logical content.  Patient is alert and oriented at baseline.    Assessment and Plan: 1. Fever, unspecified fever cause  2. Smoker  3. Flu-like symptoms - cetirizine (ZYRTEC) 10 MG tablet; Take 1 tablet (10 mg total) by mouth daily.  Dispense: 30 tablet; Refill: 11 - fluticasone (FLONASE) 50 MCG/ACT nasal spray; Place 2 sprays into both nostrils daily.  Dispense: 16 g; Refill: 6 - benzonatate (TESSALON) 200 MG capsule; Take 1 capsule (200 mg total) by mouth 3 (three) times daily as needed.  Dispense: 30 capsule; Refill: 1 - promethazine-dextromethorphan (PROMETHAZINE-DM) 6.25-15 MG/5ML syrup; Take 5 mLs by mouth 3 (three) times daily as needed for cough.  Dispense: 118 mL; Refill: 0  4. Nasal congestion - cetirizine (ZYRTEC) 10 MG tablet; Take 1  tablet (10 mg total) by mouth daily.  Dispense: 30 tablet; Refill: 11  Pt refuses Tamiflu as it "never works". Requesting antibiotic. Discussed I can not send in antibiotic.  - Take meds as prescribed - Use a cool mist humidifier  -Use saline nose sprays frequently -Force fluids -For any cough or congestion  Use plain Mucinex- regular strength or max strength is fine -For fever or aces or pains- take tylenol or ibuprofen. -Throat lozenges if help Follow up if symptoms worsen or do not improve    Follow Up Instructions: I discussed the assessment and treatment plan with the patient. The patient was provided an opportunity to ask questions and all were answered. The patient agreed with the plan and demonstrated an understanding of the instructions.  A copy of instructions were sent to the patient via MyChart unless otherwise noted below.     The patient was advised to call back or seek an in-person evaluation if the symptoms  worsen or if the condition fails to improve as anticipated.  Time:  I spent 15 minutes with the patient via telehealth technology discussing the above problems/concerns.    Stacie Rodney, FNP

## 2022-09-03 ENCOUNTER — Encounter: Payer: Self-pay | Admitting: Nurse Practitioner

## 2022-09-03 ENCOUNTER — Ambulatory Visit (INDEPENDENT_AMBULATORY_CARE_PROVIDER_SITE_OTHER): Payer: 59

## 2022-09-03 ENCOUNTER — Other Ambulatory Visit (HOSPITAL_COMMUNITY)
Admission: RE | Admit: 2022-09-03 | Discharge: 2022-09-03 | Disposition: A | Discharge status: Home / Self Care | Payer: 59 | Source: Ambulatory Visit | Attending: Nurse Practitioner | Admitting: Nurse Practitioner

## 2022-09-03 ENCOUNTER — Ambulatory Visit (INDEPENDENT_AMBULATORY_CARE_PROVIDER_SITE_OTHER): Payer: 59 | Admitting: Nurse Practitioner

## 2022-09-03 VITALS — BP 119/74 | HR 69 | Temp 97.8°F | Resp 20 | Ht 62.0 in | Wt 187.0 lb

## 2022-09-03 DIAGNOSIS — K219 Gastro-esophageal reflux disease without esophagitis: Secondary | ICD-10-CM

## 2022-09-03 DIAGNOSIS — Z6834 Body mass index (BMI) 34.0-34.9, adult: Secondary | ICD-10-CM

## 2022-09-03 DIAGNOSIS — Z6835 Body mass index (BMI) 35.0-35.9, adult: Secondary | ICD-10-CM

## 2022-09-03 DIAGNOSIS — E782 Mixed hyperlipidemia: Secondary | ICD-10-CM

## 2022-09-03 DIAGNOSIS — F410 Panic disorder [episodic paroxysmal anxiety] without agoraphobia: Secondary | ICD-10-CM

## 2022-09-03 DIAGNOSIS — F5101 Primary insomnia: Secondary | ICD-10-CM

## 2022-09-03 DIAGNOSIS — G2581 Restless legs syndrome: Secondary | ICD-10-CM

## 2022-09-03 DIAGNOSIS — R600 Localized edema: Secondary | ICD-10-CM | POA: Diagnosis not present

## 2022-09-03 DIAGNOSIS — F17210 Nicotine dependence, cigarettes, uncomplicated: Secondary | ICD-10-CM

## 2022-09-03 DIAGNOSIS — F17200 Nicotine dependence, unspecified, uncomplicated: Secondary | ICD-10-CM

## 2022-09-03 DIAGNOSIS — Z0001 Encounter for general adult medical examination with abnormal findings: Secondary | ICD-10-CM

## 2022-09-03 DIAGNOSIS — F3342 Major depressive disorder, recurrent, in full remission: Secondary | ICD-10-CM

## 2022-09-03 DIAGNOSIS — Z0000 Encounter for general adult medical examination without abnormal findings: Secondary | ICD-10-CM | POA: Diagnosis not present

## 2022-09-03 DIAGNOSIS — J0100 Acute maxillary sinusitis, unspecified: Secondary | ICD-10-CM | POA: Diagnosis not present

## 2022-09-03 MED ORDER — ZOLPIDEM TARTRATE 10 MG PO TABS
10.00 mg | ORAL_TABLET | Freq: Every evening | ORAL | 5 refills | Status: AC | PRN
Start: 2022-09-03 — End: 2023-03-02

## 2022-09-03 MED ORDER — ATORVASTATIN CALCIUM 40 MG PO TABS
40.0000 mg | ORAL_TABLET | Freq: Every day | ORAL | 1 refills | Status: DC
Start: 2022-09-03 — End: 2023-03-08

## 2022-09-03 MED ORDER — FLUCONAZOLE 150 MG PO TABS: 150.0000 mg | ORAL_TABLET | Freq: Once | ORAL | 0 refills | Status: AC

## 2022-09-03 MED ORDER — LEVOFLOXACIN 500 MG PO TABS
500.0000 mg | ORAL_TABLET | Freq: Every day | ORAL | 0 refills | Status: AC
Start: 2022-09-03 — End: ?

## 2022-09-03 MED ORDER — OMEPRAZOLE 40 MG PO CPDR: 40.0000 mg | DELAYED_RELEASE_CAPSULE | Freq: Every day | ORAL | 1 refills | Status: DC

## 2022-09-03 MED ORDER — FUROSEMIDE 40 MG PO TABS
40.0000 mg | ORAL_TABLET | Freq: Every day | ORAL | 1 refills | Status: DC
Start: 2022-09-03 — End: 2022-10-19

## 2022-09-03 MED ORDER — BUSPIRONE HCL 5 MG PO TABS
5.0000 mg | ORAL_TABLET | Freq: Three times a day (TID) | ORAL | 5 refills | Status: DC
Start: 2022-09-03 — End: 2023-03-08

## 2022-09-03 NOTE — Progress Notes (Signed)
Subjective:    Patient ID: Stacie Solis, female    DOB: [birth year], 63 y.o.   MRN: [id number]   Chief Complaint: annual physical    HPI:  Stacie Solis is a 62 y.o. who identifies as a female who was assigned female at birth.   Social history: Lives with: husband Work history: retired   Water engineer in today for follow up of the following chronic medical issues:  1. Mixed hyperlipidemia Does not watch diet and does no dedicated exercise. Lab Results  Component Value Date   CHOL 104 03/05/2022   HDL 28 (L) 03/05/2022   LDLCALC 50 03/05/2022   TRIG 147 03/05/2022   CHOLHDL 3.7 03/05/2022     2. Gastroesophageal reflux disease without esophagitis Is on omperzole and is doing well.  3. Recurrent major depressive disorder, in full remission (HCC) Is on combination of lexapro and wellbutrin. Has been doing well.says she feels no different then she has felt    09/03/2022   10:45 AM 03/05/2022   10:34 AM 09/01/2021   10:14 AM  Depression screen PHQ 2/9  Decreased Interest 1 0 0  Down, Depressed, Hopeless 1 0 0  PHQ - 2 Score 2 0 0  Altered sleeping 2 0 0  Tired, decreased energy 2 0 0  Change in appetite 2 0 0  Feeling bad or failure about yourself  0 0 0  Trouble concentrating 1 0 0  Moving slowly or fidgety/restless 0 0 0  Suicidal thoughts 0 0 0  PHQ-9 Score 9 0 0  Difficult doing work/chores Somewhat difficult Not difficult at all Not difficult at all    4. Panic attacks Takes buspar as needed.    09/03/2022   10:45 AM 03/05/2022   10:34 AM 09/01/2021   10:14 AM 02/28/2021   12:13 PM  GAD 7 : Generalized Anxiety Score  Nervous, Anxious, on Edge 1 0 0 1  Control/stop worrying 1 0 0 1  Worry too much - different things 1 0 0 1  Trouble relaxing 1 0 0 1  Restless 1 0 0 1  Easily annoyed or irritable 1 0 0 1  Afraid - awful might happen 1 0 0 0  Total GAD 7 Score 7 0 0 6  Anxiety Difficulty Somewhat difficult Not difficult at all Not difficult at all Not difficult at all       5. Primary insomnia Is on ambien to sleep at night. Sleeps about 7-8 hours a night.  6. RLS (restless legs syndrome) Is on mirapex which really helps his legs to relax.  7. Smoker Smokes over a pack a day. Refuses low dose ct scan right now. Had one in 2023 which was normal  8. BMI 35.0-35.9,adult No recent weight changes Wt Readings from Last 3 Encounters:  09/03/22 187 lb (84.8 kg)  06/11/22 184 lb 6.4 oz (83.6 kg)  03/05/22 171 lb (77.6 kg)   BMI Readings from Last 3 Encounters:  09/03/22 34.20 kg/m  06/11/22 33.73 kg/m  03/05/22 31.28 kg/m     New complaints: Has had headache and sinus pressure for almost 2 weeks. Is on flonase and claritin.  Allergies  Allergen Reactions   Sulfa Antibiotics Nausea Only   Outpatient Encounter Medications as of 09/03/2022  Medication Sig   albuterol (VENTOLIN HFA) 108 (90 Base) MCG/ACT inhaler INHALE 2 PUFFS EVERY 4 HOURS AS NEEDED   atorvastatin (LIPITOR) 40 MG tablet Take 1 tablet (40 mg total) by mouth daily.  benzonatate (TESSALON) 200 MG capsule Take 1 capsule (200 mg total) by mouth 3 (three) times daily as needed.   buPROPion (WELLBUTRIN XL) 150 MG 24 hr tablet Take 1 tablet (150 mg total) by mouth daily.   busPIRone (BUSPAR) 5 MG tablet Take 1 tablet (5 mg total) by mouth 3 (three) times daily.   cetirizine (ZYRTEC) 10 MG tablet Take 1 tablet (10 mg total) by mouth daily.   Cholecalciferol (VITAMIN D) 50 MCG (2000 UT) tablet Take 2,000 Units by mouth daily.   escitalopram (LEXAPRO) 20 MG tablet Take 1 tablet (20 mg total) by mouth daily.   fenofibrate micronized (LOFIBRA) 134 MG capsule TAKE 1 CAPSULE BY MOUTH DAILY BEFORE BREAKFAST.   fish oil-omega-3 fatty acids 1000 MG capsule Take 1 g by mouth daily.   fluticasone (FLONASE) 50 MCG/ACT nasal spray Place 2 sprays into both nostrils daily.   furosemide (LASIX) 40 MG tablet Take 1 tablet (40 mg total) by mouth daily.   meloxicam (MOBIC) 15 MG tablet Take 15 mg by  mouth daily as needed for pain.   metaxalone (SKELAXIN) 800 MG tablet Take 800 mg by mouth 3 (three) times daily.   omeprazole (PRILOSEC) 40 MG capsule Take 1 capsule (40 mg total) by mouth daily.   PERCOCET 5-325 MG tablet Take 1 tablet by mouth 4 (four) times daily as needed for moderate pain.   pramipexole (MIRAPEX) 0.5 MG tablet Take 1 tablet (0.5 mg total) by mouth 3 (three) times daily.   promethazine-dextromethorphan (PROMETHAZINE-DM) 6.25-15 MG/5ML syrup Take 5 mLs by mouth 3 (three) times daily as needed for cough.   triamcinolone (NASACORT) 55 MCG/ACT AERO nasal inhaler PLACE 2 SPRAYS INTO THE NOSE DAILY.   Vaginal Lubricant (REPLENS) GEL Place 1 Application vaginally daily as needed (moisture/irritation).   zolpidem (AMBIEN) 10 MG tablet Take 1 tablet (10 mg total) by mouth at bedtime as needed for sleep.   No facility-administered encounter medications on file as of 09/03/2022.    Past Surgical History:  Procedure Laterality Date   BRAIN SURGERY     trig neuralgia   BREAST BIOPSY Right 11/07/2020   fibroadenoma   CHOLECYSTECTOMY      Family History  Problem Relation Age of Onset   Hypertension Mother    Diabetes Mother    Arthritis Mother    Lung cancer Maternal Grandfather       Controlled substance contract: n/a     Review of Systems  Constitutional:  Negative for diaphoresis.  Eyes:  Negative for pain.  Respiratory:  Negative for shortness of breath.   Cardiovascular:  Negative for chest pain, palpitations and leg swelling.  Gastrointestinal:  Negative for abdominal pain.  Endocrine: Negative for polydipsia.  Skin:  Negative for rash.  Neurological:  Negative for dizziness, weakness and headaches.  Hematological:  Does not bruise/bleed easily.  All other systems reviewed and are negative.      Objective:   Physical Exam Vitals and nursing note reviewed.  Constitutional:      General: She is not in acute distress.    Appearance: Normal appearance.  She is well-developed.  HENT:     Head: Normocephalic.     Right Ear: Tympanic membrane normal.     Left Ear: Tympanic membrane normal.     Nose: Nose normal.     Mouth/Throat:     Mouth: Mucous membranes are moist.  Eyes:     Pupils: Pupils are equal, round, and reactive to light.  Neck:  Vascular: No carotid bruit or JVD.  Cardiovascular:     Rate and Rhythm: Normal rate and regular rhythm.     Heart sounds: Normal heart sounds.  Pulmonary:     Effort: Pulmonary effort is normal. No respiratory distress.     Breath sounds: Normal breath sounds. No wheezing or rales.  Chest:     Chest wall: No tenderness.  Abdominal:     General: Bowel sounds are normal. There is no distension or abdominal bruit.     Palpations: Abdomen is soft. There is no hepatomegaly, splenomegaly, mass or pulsatile mass.     Tenderness: There is no abdominal tenderness.  Genitourinary:    General: Normal vulva.     Vagina: No vaginal discharge.     Rectum: Normal.     Comments: Cervix parous and pink No adnexal masses or tenderness Musculoskeletal:        General: Normal range of motion.     Cervical back: Normal range of motion and neck supple.     Right lower leg: Edema (1+) present.     Left lower leg: Edema (1+) present.  Lymphadenopathy:     Cervical: No cervical adenopathy.  Skin:    General: Skin is warm and dry.  Neurological:     Mental Status: She is alert and oriented to person, place, and time.     Deep Tendon Reflexes: Reflexes are normal and symmetric.  Psychiatric:        Behavior: Behavior normal.        Thought Content: Thought content normal.        Judgment: Judgment normal.     BP 119/74   Pulse 69   Temp 97.8 F (36.6 C) (Temporal)   Resp 20   Ht 5\' 2"  (1.575 m)   Wt 187 lb (84.8 kg)   SpO2 94%   BMI 34.20 kg/m    Chest xray unchanged-Preliminary reading by Paulene Floor, FNP  [facility] EKG- NSR-Mary-Margaret Daphine Deutscher, FNP     Assessment & Plan:   Stacie Solis  comes in today with chief complaint of Annual Exam (Sinus and allergies/)   Diagnosis and orders addressed:  1. Annual physical exam - Thyroid Panel With TSH - VITAMIN D 25 Hydroxy (Vit-D Deficiency, Fractures) - Cytology - PAP  2. Mixed hyperlipidemia Low fat diet - EKG 12-Lead - CBC with Differential/Platelet - CMP14+EGFR - Lipid panel - atorvastatin (LIPITOR) 40 MG tablet; Take 1 tablet (40 mg total) by mouth daily.  Dispense: 90 tablet; Refill: 1  3. Gastroesophageal reflux disease without esophagitis Avoid spicy foods Do not eat 2 hours prior to bedtime - omeprazole (PRILOSEC) 40 MG capsule; Take 1 capsule (40 mg total) by mouth daily.  Dispense: 90 capsule; Refill: 1  4. Recurrent major depressive disorder, in full remission (HCC) Stress management  5. Panic attacks - busPIRone (BUSPAR) 5 MG tablet; Take 1 tablet (5 mg total) by mouth 3 (three) times daily.  Dispense: 90 tablet; Refill: 5  6. Primary insomnia Bedtime routine - zolpidem (AMBIEN) 10 MG tablet; Take 1 tablet (10 mg total) by mouth at bedtime as needed for sleep.  Dispense: 30 tablet; Refill: 5  7. RLS (restless legs syndrome) Keep legs warm at night  8. Smoker Smoking cessation encouraged Will think about having low dose CT scan - DG Chest 2 View  9. BMI 35.0-35.9,adult Discussed diet and exercise for person with BMI >25 Will recheck weight in 3-6 months   10. Acute non-recurrent maxillary sinusitis  1. Take meds as prescribed 2. Use a cool mist humidifier especially during the winter months and when heat has been humid. 3. Use saline nose sprays frequently 4. Saline irrigations of the nose can be very helpful if done frequently.  * 4X daily for 1 week*  * Use of a nettie pot can be helpful with this. Follow directions with this* 5. Drink plenty of fluids 6. Keep thermostat turn down low 7.For any cough or congestion- mucinex if needed 8. For fever or aces or pains- take tylenol or ibuprofen  appropriate for age and weight.  * for evers greater than 101 orally you may alternate ibuprofen and tylenol every  3 hours.    - levofloxacin (LEVAQUIN) 500 MG tablet; Take 1 tablet (500 mg total) by mouth daily.  Dispense: 7 tablet; Refill: 0  11. Peripheral edema Elevate legs when sitting - furosemide (LASIX) 40 MG tablet; Take 1 tablet (40 mg total) by mouth daily.  Dispense: 90 tablet; Refill: 1   Labs pending Health Maintenance reviewed Diet and exercise encouraged  Follow up plan: 6 months   Mary-Margaret Daphine Deutscher, FNP

## 2022-09-04 LAB — LIPID PANEL
Chol/HDL Ratio: 3.4 ratio (ref 0.0–4.4)
Cholesterol, Total: 124 mg/dL (ref 100–199)
HDL: 37 mg/dL — ABNORMAL LOW (ref >39)
LDL Chol Calc (NIH): 64 mg/dL (ref 0–99)
Triglycerides: 129 mg/dL (ref 0–149)
VLDL Cholesterol Cal: 23 mg/dL (ref 5–40)

## 2022-09-04 LAB — CMP14+EGFR
ALT: 22 IU/L (ref 0–32)
AST: 30 IU/L (ref 0–40)
Albumin: 4.3 g/dL (ref 3.9–4.9)
Alkaline Phosphatase: 75 IU/L (ref 44–121)
BUN/Creatinine Ratio: 15 (ref 12–28)
BUN: 27 mg/dL (ref 8–27)
Bilirubin Total: 0.3 mg/dL (ref 0.0–1.2)
CO2: 26 mmol/L (ref 20–29)
Calcium: 9.8 mg/dL (ref 8.7–10.3)
Chloride: 102 mmol/L (ref 96–106)
Creatinine, Ser: 1.78 mg/dL — ABNORMAL HIGH (ref 0.57–1.00)
Globulin, Total: 2.5 g/dL (ref 1.5–4.5)
Glucose: 84 mg/dL (ref 70–99)
Potassium: 4.6 mmol/L (ref 3.5–5.2)
Sodium: 143 mmol/L (ref 134–144)
Total Protein: 6.8 g/dL (ref 6.0–8.5)
eGFR: 32 mL/min/{1.73_m2} — ABNORMAL LOW (ref >59)

## 2022-09-04 LAB — CBC WITH DIFFERENTIAL/PLATELET
Basophils Absolute: 0.2 10*3/uL (ref 0.0–0.2)
Basos: 2 %
EOS (ABSOLUTE): 0.3 10*3/uL (ref 0.0–0.4)
Eos: 4 %
Hematocrit: 41.5 % (ref 34.0–46.6)
Hemoglobin: 13.5 g/dL (ref 11.1–15.9)
Immature Grans (Abs): 0.1 10*3/uL (ref 0.0–0.1)
Immature Granulocytes: 1 %
Lymphocytes Absolute: 2.4 10*3/uL (ref 0.7–3.1)
Lymphs: 35 %
MCH: 30.1 pg (ref 26.6–33.0)
MCHC: 32.5 g/dL (ref 31.5–35.7)
MCV: 93 fL (ref 79–97)
Monocytes Absolute: 0.6 10*3/uL (ref 0.1–0.9)
Monocytes: 8 %
Neutrophils Absolute: 3.5 10*3/uL (ref 1.4–7.0)
Neutrophils: 50 %
Platelets: 201 10*3/uL (ref 150–450)
RBC: 4.48 x10E6/uL (ref 3.77–5.28)
RDW: 12.9 % (ref 11.7–15.4)
WBC: 7 10*3/uL (ref 3.4–10.8)

## 2022-09-04 LAB — CYTOLOGY - PAP: Diagnosis: NEGATIVE

## 2022-09-04 LAB — THYROID PANEL WITH TSH
Free Thyroxine Index: 1 — ABNORMAL LOW (ref 1.2–4.9)
T3 Uptake Ratio: 20 % — ABNORMAL LOW (ref 24–39)
T4, Total: 5.2 ug/dL (ref 4.5–12.0)
TSH: 4.88 u[IU]/mL — ABNORMAL HIGH (ref 0.450–4.500)

## 2022-09-04 LAB — VITAMIN D 25 HYDROXY (VIT D DEFICIENCY, FRACTURES): Vit D, 25-Hydroxy: 45.5 ng/mL (ref 30.0–100.0)

## 2022-09-05 DIAGNOSIS — M5136 Other intervertebral disc degeneration, lumbar region: Secondary | ICD-10-CM | POA: Diagnosis not present

## 2022-09-05 DIAGNOSIS — M4150 Other secondary scoliosis, site unspecified: Secondary | ICD-10-CM | POA: Diagnosis not present

## 2022-09-05 DIAGNOSIS — M48062 Spinal stenosis, lumbar region with neurogenic claudication: Secondary | ICD-10-CM | POA: Diagnosis not present

## 2022-09-05 DIAGNOSIS — M810 Age-related osteoporosis without current pathological fracture: Secondary | ICD-10-CM | POA: Diagnosis not present

## 2022-09-17 ENCOUNTER — Telehealth: Payer: Self-pay | Admitting: Nurse Practitioner

## 2022-09-17 NOTE — Telephone Encounter (Signed)
Mychart video visit scheduled for tomorrow

## 2022-09-17 NOTE — Telephone Encounter (Signed)
Please schedule at least for video visit Tuesday so I can see her legs

## 2022-09-18 ENCOUNTER — Telehealth (INDEPENDENT_AMBULATORY_CARE_PROVIDER_SITE_OTHER): Payer: 59 | Admitting: Nurse Practitioner

## 2022-09-18 ENCOUNTER — Encounter: Payer: Self-pay | Admitting: Nurse Practitioner

## 2022-09-18 DIAGNOSIS — L03116 Cellulitis of left lower limb: Secondary | ICD-10-CM | POA: Diagnosis not present

## 2022-09-18 DIAGNOSIS — L03115 Cellulitis of right lower limb: Secondary | ICD-10-CM | POA: Diagnosis not present

## 2022-09-18 DIAGNOSIS — M810 Age-related osteoporosis without current pathological fracture: Secondary | ICD-10-CM | POA: Diagnosis not present

## 2022-09-18 DIAGNOSIS — Z780 Asymptomatic menopausal state: Secondary | ICD-10-CM | POA: Diagnosis not present

## 2022-09-18 MED ORDER — DOXYCYCLINE HYCLATE 100 MG PO TABS: 100.0000 mg | ORAL_TABLET | Freq: Two times a day (BID) | ORAL | 0 refills | Status: DC

## 2022-09-18 NOTE — Patient Instructions (Signed)
Kara Dies, thank you for joining Bennie Pierini, FNP for today's virtual visit.  While this provider is not your primary care provider (PCP), if your PCP is located in our provider database this encounter information will be shared with them immediately following your visit.   A [facility] MyChart account gives you access to today's visit and all your visits, tests, and labs performed at [facility] " click here if you don't have a [facility] MyChart account or go to [email or web address]  Consent: (Patient) Stacie Solis provided verbal consent for this virtual visit at the beginning of the encounter.  Current Medications:  Current Outpatient Medications:    doxycycline (VIBRA-TABS) 100 MG tablet, Take 1 tablet (100 mg total) by mouth 2 (two) times daily. 1 po bid, Disp: 20 tablet, Rfl: 0   albuterol (VENTOLIN HFA) 108 (90 Base) MCG/ACT inhaler, INHALE 2 PUFFS EVERY 4 HOURS AS NEEDED, Disp: 8.5 each, Rfl: 2   atorvastatin (LIPITOR) 40 MG tablet, Take 1 tablet (40 mg total) by mouth daily., Disp: 90 tablet, Rfl: 1   buPROPion (WELLBUTRIN XL) 150 MG 24 hr tablet, Take 1 tablet (150 mg total) by mouth daily., Disp: 90 tablet, Rfl: 1   busPIRone (BUSPAR) 5 MG tablet, Take 1 tablet (5 mg total) by mouth 3 (three) times daily., Disp: 90 tablet, Rfl: 5   cetirizine (ZYRTEC) 10 MG tablet, Take 1 tablet (10 mg total) by mouth daily., Disp: 30 tablet, Rfl: 11   Cholecalciferol (VITAMIN D) 50 MCG (2000 UT) tablet, Take 2,000 Units by mouth daily., Disp: , Rfl:    escitalopram (LEXAPRO) 20 MG tablet, Take 1 tablet (20 mg total) by mouth daily., Disp: 90 tablet, Rfl: 1   fenofibrate micronized (LOFIBRA) 134 MG capsule, TAKE 1 CAPSULE BY MOUTH DAILY BEFORE BREAKFAST., Disp: 90 capsule, Rfl: 1   fish oil-omega-3 fatty acids 1000 MG capsule, Take 1 g by mouth daily., Disp: , Rfl:    fluticasone (FLONASE) 50 MCG/ACT nasal spray, Place 2 sprays into both nostrils daily., Disp: 16 g,  Rfl: 6   furosemide (LASIX) 40 MG tablet, Take 1 tablet (40 mg total) by mouth daily., Disp: 90 tablet, Rfl: 1   levofloxacin (LEVAQUIN) 500 MG tablet, Take 1 tablet (500 mg total) by mouth daily., Disp: 7 tablet, Rfl: 0   meloxicam (MOBIC) 15 MG tablet, Take 15 mg by mouth daily as needed for pain., Disp: , Rfl:    metaxalone (SKELAXIN) 800 MG tablet, Take 800 mg by mouth 3 (three) times daily., Disp: , Rfl:    omeprazole (PRILOSEC) 40 MG capsule, Take 1 capsule (40 mg total) by mouth daily., Disp: 90 capsule, Rfl: 1   PERCOCET 5-325 MG tablet, Take 1 tablet by mouth 4 (four) times daily as needed for moderate pain., Disp: , Rfl:    pramipexole (MIRAPEX) 0.5 MG tablet, Take 1 tablet (0.5 mg total) by mouth 3 (three) times daily., Disp: 270 tablet, Rfl: 1   promethazine-dextromethorphan (PROMETHAZINE-DM) 6.25-15 MG/5ML syrup, Take 5 mLs by mouth 3 (three) times daily as needed for cough., Disp: 118 mL, Rfl: 0   triamcinolone (NASACORT) 55 MCG/ACT AERO nasal inhaler, PLACE 2 SPRAYS INTO THE NOSE DAILY., Disp: 16.9 each, Rfl: 0   Vaginal Lubricant (REPLENS) GEL, Place 1 Application vaginally daily as needed (moisture/irritation)., Disp: , Rfl:    zolpidem (AMBIEN) 10 MG tablet, Take 1 tablet (10 mg total) by mouth at bedtime as needed for sleep., Disp: 30 tablet, Rfl: 5   Medications  ordered in this encounter:  Meds ordered this encounter  Medications   doxycycline (VIBRA-TABS) 100 MG tablet    Sig: Take 1 tablet (100 mg total) by mouth 2 (two) times daily. 1 po bid    Dispense:  20 tablet    Refill:  0    Order Specific Question:   Supervising Provider    Answer:   Arville Care A [[id number]]     *If you need refills on other medications prior to your next appointment, please contact your pharmacy*  Follow-Up: Call back or seek an in-person evaluation if the symptoms worsen or if the condition fails to improve as anticipated.  [facility] [phone]  Other  Instructions Take extra fluid pill at 1pm daily for 3 days Elevate legs when sitting  Cool compresses Compression hose   If you have been instructed to have an in-person evaluation today at a local Urgent Care facility, please use the link below. It will take you to a list of all of our available [facility], including address, phone number and hours of operation. Please do not delay care.  [facility]  If you or a family member do not have a primary care provider, use the link below to schedule a visit and establish care. When you choose a [facility] primary care physician or advanced practice provider, you gain a long-term partner in health. Find a Primary Care Provider  Learn more about [facility]'s in-office and virtual care options: [facility] - Get Care Now

## 2022-09-18 NOTE — Progress Notes (Signed)
Virtual Visit Consent   Stacie Solis, you are scheduled for a virtual visit with Mary-Margaret Daphine Deutscher, FNP, a [facility] provider, today.     Just as with appointments in the office, your consent must be obtained to participate.  Your consent will be active for this visit and any virtual visit you may have with one of our providers in the next 365 days.     If you have a MyChart account, a copy of this consent can be sent to you electronically.  All virtual visits are billed to your insurance company just like a traditional visit in the office.    As this is a virtual visit, video technology does not allow for your provider to perform a traditional examination.  This may limit your provider's ability to fully assess your condition.  If your provider identifies any concerns that need to be evaluated in person or the need to arrange testing (such as labs, EKG, etc.), we will make arrangements to do so.     Although advances in technology are sophisticated, we cannot ensure that it will always work on either your end or our end.  If the connection with a video visit is poor, the visit may have to be switched to a telephone visit.  With either a video or telephone visit, we are not always able to ensure that we have a secure connection.     I need to obtain your verbal consent now.   Are you willing to proceed with your visit today? YES   Stacie Solis has provided verbal consent on 09/18/2022 for a virtual visit (video or telephone).   Mary-Margaret Daphine Deutscher, FNP   Date: 09/18/2022 10:35 AM   Virtual Visit via Video Note   I, Mary-Margaret Daphine Deutscher, connected with Stacie Solis ([id number], 1961/02/08) on 09/18/22 at  4:30 PM EDT by a video-enabled telemedicine application and verified that I am speaking with the correct person using two identifiers.  Location: Patient: Virtual Visit Location Patient: Home Provider: Virtual Visit Location Provider: Mobile   I discussed the limitations of  evaluation and management by telemedicine and the availability of in person appointments. The patient expressed understanding and agreed to proceed.    History of Present Illness: Stacie Solis is a 62 y.o. who identifies as a female who was assigned female at birth, and is being seen today for cellulitis.  HPI: Legs started swelling about 1 week ago and now they have blisters on them that are weeping.     Review of Systems  Constitutional:  Negative for chills and fever.  Cardiovascular:  Positive for leg swelling.    Problems:  Patient Active Problem List   Diagnosis Date Noted   Peripheral edema 09/03/2022   Chronic lower back pain 06/08/2014   Smoker 03/03/2014   BMI 35.0-35.9,adult 03/03/2014   RLS (restless legs syndrome) 03/03/2014   Insomnia 03/03/2014   Depression 01/05/2013   GERD (gastroesophageal reflux disease) 09/11/2012   Hyperlipidemia 09/11/2012   Panic attacks 09/11/2012   Trigeminal neuralgia 09/11/2012    Allergies:  Allergies  Allergen Reactions   Sulfa Antibiotics Nausea Only   Medications:  Current Outpatient Medications:    albuterol (VENTOLIN HFA) 108 (90 Base) MCG/ACT inhaler, INHALE 2 PUFFS EVERY 4 HOURS AS NEEDED, Disp: 8.5 each, Rfl: 2   atorvastatin (LIPITOR) 40 MG tablet, Take 1 tablet (40 mg total) by mouth daily., Disp: 90 tablet, Rfl: 1   buPROPion (WELLBUTRIN XL) 150 MG 24 hr  tablet, Take 1 tablet (150 mg total) by mouth daily., Disp: 90 tablet, Rfl: 1   busPIRone (BUSPAR) 5 MG tablet, Take 1 tablet (5 mg total) by mouth 3 (three) times daily., Disp: 90 tablet, Rfl: 5   cetirizine (ZYRTEC) 10 MG tablet, Take 1 tablet (10 mg total) by mouth daily., Disp: 30 tablet, Rfl: 11   Cholecalciferol (VITAMIN D) 50 MCG (2000 UT) tablet, Take 2,000 Units by mouth daily., Disp: , Rfl:    escitalopram (LEXAPRO) 20 MG tablet, Take 1 tablet (20 mg total) by mouth daily., Disp: 90 tablet, Rfl: 1   fenofibrate micronized (LOFIBRA) 134 MG capsule, TAKE 1  CAPSULE BY MOUTH DAILY BEFORE BREAKFAST., Disp: 90 capsule, Rfl: 1   fish oil-omega-3 fatty acids 1000 MG capsule, Take 1 g by mouth daily., Disp: , Rfl:    fluticasone (FLONASE) 50 MCG/ACT nasal spray, Place 2 sprays into both nostrils daily., Disp: 16 g, Rfl: 6   furosemide (LASIX) 40 MG tablet, Take 1 tablet (40 mg total) by mouth daily., Disp: 90 tablet, Rfl: 1   levofloxacin (LEVAQUIN) 500 MG tablet, Take 1 tablet (500 mg total) by mouth daily., Disp: 7 tablet, Rfl: 0   meloxicam (MOBIC) 15 MG tablet, Take 15 mg by mouth daily as needed for pain., Disp: , Rfl:    metaxalone (SKELAXIN) 800 MG tablet, Take 800 mg by mouth 3 (three) times daily., Disp: , Rfl:    omeprazole (PRILOSEC) 40 MG capsule, Take 1 capsule (40 mg total) by mouth daily., Disp: 90 capsule, Rfl: 1   PERCOCET 5-325 MG tablet, Take 1 tablet by mouth 4 (four) times daily as needed for moderate pain., Disp: , Rfl:    pramipexole (MIRAPEX) 0.5 MG tablet, Take 1 tablet (0.5 mg total) by mouth 3 (three) times daily., Disp: 270 tablet, Rfl: 1   promethazine-dextromethorphan (PROMETHAZINE-DM) 6.25-15 MG/5ML syrup, Take 5 mLs by mouth 3 (three) times daily as needed for cough., Disp: 118 mL, Rfl: 0   triamcinolone (NASACORT) 55 MCG/ACT AERO nasal inhaler, PLACE 2 SPRAYS INTO THE NOSE DAILY., Disp: 16.9 each, Rfl: 0   Vaginal Lubricant (REPLENS) GEL, Place 1 Application vaginally daily as needed (moisture/irritation)., Disp: , Rfl:    zolpidem (AMBIEN) 10 MG tablet, Take 1 tablet (10 mg total) by mouth at bedtime as needed for sleep., Disp: 30 tablet, Rfl: 5  Observations/Objective: Patient is well-developed, well-nourished in no acute distress.  Resting comfortably  at home.  Head is normocephalic, atraumatic.  No labored breathing.  Speech is clear and coherent with logical content.  Patient is alert and oriented at baseline.  Bil lower ext edema with fluid weeping  Assessment and Plan:  Stacie Solis in today with chief  complaint of Cellulitis   1. Cellulitis of both lower extremities Take extra fluid pill at 1pm daily for 3 days Elevate legs when sitting  Cool compresses Compression hose Meds ordered this encounter  Medications   doxycycline (VIBRA-TABS) 100 MG tablet    Sig: Take 1 tablet (100 mg total) by mouth 2 (two) times daily. 1 po bid    Dispense:  20 tablet    Refill:  0    Order Specific Question:   Supervising Provider    Answer:   Arville Care A [[id number]]     Follow Up Instructions: I discussed the assessment and treatment plan with the patient. The patient was provided an opportunity to ask questions and all were answered. The patient agreed with the plan and demonstrated  an understanding of the instructions.  A copy of instructions were sent to the patient via MyChart.  The patient was advised to call back or seek an in-person evaluation if the symptoms worsen or if the condition fails to improve as anticipated.  Time:  I spent 11 minutes with the patient via telehealth technology discussing the above problems/concerns.    Mary-Margaret Daphine Deutscher, FNP

## 2022-09-23 ENCOUNTER — Other Ambulatory Visit: Payer: Self-pay | Admitting: Nurse Practitioner

## 2022-09-23 DIAGNOSIS — E782 Mixed hyperlipidemia: Secondary | ICD-10-CM

## 2022-09-23 DIAGNOSIS — F3342 Major depressive disorder, recurrent, in full remission: Secondary | ICD-10-CM

## 2022-09-25 ENCOUNTER — Telehealth: Payer: Self-pay | Admitting: Nurse Practitioner

## 2022-09-25 NOTE — Telephone Encounter (Signed)
Pt had a dexa at [facility]. She says that she was recommended to take VIT D and calcium. She was told to call pcp to ask how much to take. Pt says that she is already taking VIT D. Please advice on the calc cum. Pt aware MMM is off this week. Can covering advice?

## 2022-09-25 NOTE — Telephone Encounter (Signed)
Recommend Vit D 800 IU daily, 1200mg  of Calcium daily. Weight bearing exercise.

## 2022-09-25 NOTE — Telephone Encounter (Signed)
Patient aware and verbalizes understanding. 

## 2022-10-16 DIAGNOSIS — M48062 Spinal stenosis, lumbar region with neurogenic claudication: Secondary | ICD-10-CM | POA: Diagnosis not present

## 2022-10-16 DIAGNOSIS — Z720 Tobacco use: Secondary | ICD-10-CM | POA: Diagnosis not present

## 2022-10-16 DIAGNOSIS — M5416 Radiculopathy, lumbar region: Secondary | ICD-10-CM | POA: Diagnosis not present

## 2022-10-16 DIAGNOSIS — G894 Chronic pain syndrome: Secondary | ICD-10-CM | POA: Diagnosis not present

## 2022-10-16 DIAGNOSIS — M5136 Other intervertebral disc degeneration, lumbar region: Secondary | ICD-10-CM | POA: Diagnosis not present

## 2022-10-16 DIAGNOSIS — Z79891 Long term (current) use of opiate analgesic: Secondary | ICD-10-CM | POA: Diagnosis not present

## 2022-10-18 DIAGNOSIS — M4316 Spondylolisthesis, lumbar region: Secondary | ICD-10-CM | POA: Diagnosis not present

## 2022-10-18 DIAGNOSIS — M4726 Other spondylosis with radiculopathy, lumbar region: Secondary | ICD-10-CM | POA: Diagnosis not present

## 2022-10-18 DIAGNOSIS — M48062 Spinal stenosis, lumbar region with neurogenic claudication: Secondary | ICD-10-CM | POA: Diagnosis not present

## 2022-10-19 ENCOUNTER — Encounter: Payer: Self-pay | Admitting: Family Medicine

## 2022-10-19 ENCOUNTER — Ambulatory Visit: Payer: 59 | Admitting: Family Medicine

## 2022-10-19 VITALS — BP 123/72 | HR 85 | Temp 98.7°F | Ht 62.0 in | Wt 187.0 lb

## 2022-10-19 DIAGNOSIS — R600 Localized edema: Secondary | ICD-10-CM | POA: Diagnosis not present

## 2022-10-19 DIAGNOSIS — R062 Wheezing: Secondary | ICD-10-CM | POA: Diagnosis not present

## 2022-10-19 MED ORDER — ALBUTEROL SULFATE HFA 108 (90 BASE) MCG/ACT IN AERS
INHALATION_SPRAY | RESPIRATORY_TRACT | 2 refills | Status: DC
Start: 2022-10-19 — End: 2023-05-02

## 2022-10-19 MED ORDER — FUROSEMIDE 40 MG PO TABS: 40.0000 mg | ORAL_TABLET | Freq: Every day | ORAL | 1 refills | Status: DC

## 2022-10-19 NOTE — Progress Notes (Signed)
Subjective:  Patient ID: Stacie Solis, female    DOB: [birth year], 62 y.o.   MRN: [id number]  Patient Care Team: Bennie Pierini, FNP as PCP - General (Nurse Practitioner) Shellia Carwin., MD as Referring Physician (Neurosurgery)   Chief Complaint:  Leg Swelling (bilateral)   HPI: Stacie Solis is a 62 y.o. female presenting on 10/19/2022 for Leg Swelling (bilateral)  HPI States that she noticed leg swelling 2 months ago. States that her PCP is aware of it. She was seen on 09/03/22 for CPE and had peripheral edema at that time. Reports that her edema is worse now than it was then. She was then seen by video visit with PCP on 09/18/22 and received antibiotic for cellulitis. She reports that it helped some and that blisters are now gone, but she still has swelling. She is continuing to take lasix. States that her weight fluctuates. States that she took extra dose for one week and it did not improve her fluid levels. She tried wearing her compression stockings at night sometimes, but feels that they are too tight and cut off her circulation. Reports that she sleeps most nights in the recliner. Reports that she feels full quickly with meals and that she is often short of breath with little activity.   Relevant past medical, surgical, family, and social history reviewed and updated as indicated.  Allergies and medications reviewed and updated. Data reviewed: Chart in Epic.  Past Medical History:  Diagnosis Date   Allergy    Anxiety    Arthritis    Chronic back pain    Depression    GERD (gastroesophageal reflux disease)    Hyperlipidemia     Past Surgical History:  Procedure Laterality Date   BRAIN SURGERY     trig neuralgia   BREAST BIOPSY Right 11/07/2020   fibroadenoma   CHOLECYSTECTOMY      Social History   Socioeconomic History   Marital status: Married    Spouse name: Not on file   Number of children: 2   Years of education: Not on file   Highest education  level: Not on file  Occupational History   Occupation: N/A  Tobacco Use   Smoking status: Every Day    Current packs/day: 1.00    Average packs/day: 1 pack/day for 48.0 years (48.0 ttl pk-yrs)    Types: Cigarettes   Smokeless tobacco: Never   Tobacco comments:    Declined info  Vaping Use   Vaping status: Never Used  Substance and Sexual Activity   Alcohol use: No    Alcohol/week: 0.0 standard drinks of alcohol   Drug use: No   Sexual activity: Not on file  Other Topics Concern   Not on file  Social History Narrative   Not on file   Social Determinants of Health   Financial Resource Strain: Not on file  Food Insecurity: Not on file  Transportation Needs: Not on file  Physical Activity: Not on file  Stress: Not on file  Social Connections: Unknown (07/18/2022)   Received from [facility]   Social Network    Social Network: Not on file  Intimate Partner Violence: Unknown (07/18/2022)   Received from [facility], [facility]   HITS    Physically Hurt: Not on file    Insult or Talk Down To: Not on file    Threaten Physical Harm: Not on file    Scream or Curse: Not on file  Outpatient Encounter Medications as of 10/19/2022  Medication Sig   albuterol (VENTOLIN HFA) 108 (90 Base) MCG/ACT inhaler INHALE 2 PUFFS EVERY 4 HOURS AS NEEDED   atorvastatin (LIPITOR) 40 MG tablet Take 1 tablet (40 mg total) by mouth daily.   buPROPion (WELLBUTRIN XL) 150 MG 24 hr tablet TAKE 1 TABLET BY MOUTH EVERY DAY   busPIRone (BUSPAR) 5 MG tablet Take 1 tablet (5 mg total) by mouth 3 (three) times daily.   cetirizine (ZYRTEC) 10 MG tablet Take 1 tablet (10 mg total) by mouth daily.   Cholecalciferol (VITAMIN D) 50 MCG (2000 UT) tablet Take 2,000 Units by mouth daily.   doxycycline (VIBRA-TABS) 100 MG tablet Take 1 tablet (100 mg total) by mouth 2 (two) times daily. 1 po bid   escitalopram (LEXAPRO) 20 MG tablet TAKE 1 TABLET BY MOUTH EVERY DAY   fenofibrate micronized  (LOFIBRA) 134 MG capsule TAKE 1 CAPSULE BY MOUTH EVERY DAY BEFORE BREAKFAST   fish oil-omega-3 fatty acids 1000 MG capsule Take 1 g by mouth daily.   fluticasone (FLONASE) 50 MCG/ACT nasal spray Place 2 sprays into both nostrils daily.   furosemide (LASIX) 40 MG tablet Take 1 tablet (40 mg total) by mouth daily.   levofloxacin (LEVAQUIN) 500 MG tablet Take 1 tablet (500 mg total) by mouth daily.   meloxicam (MOBIC) 15 MG tablet Take 15 mg by mouth daily as needed for pain.   metaxalone (SKELAXIN) 800 MG tablet Take 800 mg by mouth 3 (three) times daily.   omeprazole (PRILOSEC) 40 MG capsule Take 1 capsule (40 mg total) by mouth daily.   PERCOCET 5-325 MG tablet Take 1 tablet by mouth 4 (four) times daily as needed for moderate pain.   pramipexole (MIRAPEX) 0.5 MG tablet Take 1 tablet (0.5 mg total) by mouth 3 (three) times daily.   predniSONE (DELTASONE) 10 MG tablet 3 tablets po x 3 days, 2 tablets po x 2 days, 1 tablet po x 1 day   promethazine-dextromethorphan (PROMETHAZINE-DM) 6.25-15 MG/5ML syrup Take 5 mLs by mouth 3 (three) times daily as needed for cough.   triamcinolone (NASACORT) 55 MCG/ACT AERO nasal inhaler PLACE 2 SPRAYS INTO THE NOSE DAILY.   Vaginal Lubricant (REPLENS) GEL Place 1 Application vaginally daily as needed (moisture/irritation).   zolpidem (AMBIEN) 10 MG tablet Take 1 tablet (10 mg total) by mouth at bedtime as needed for sleep.   celecoxib (CELEBREX) 200 MG capsule Take 200 mg by mouth daily as needed. (Patient not taking: Reported on 10/19/2022)   naloxone [facility]) nasal spray 4 mg/0.1 mL    No facility-administered encounter medications on file as of 10/19/2022.    Allergies  Allergen Reactions   Sulfa Antibiotics Nausea Only    Review of Systems As per HPI      Objective:  BP 123/72   Pulse 85   Temp 98.7 F (37.1 C)   Ht 5\' 2"  (1.575 m)   Wt 187 lb (84.8 kg)   SpO2 94%   BMI 34.20 kg/m    Wt Readings from Last 3 Encounters:  10/19/22 187 lb  (84.8 kg)  09/03/22 187 lb (84.8 kg)  06/11/22 184 lb 6.4 oz (83.6 kg)   Physical Exam Constitutional:      General: She is awake. She is not in acute distress.    Appearance: Normal appearance. She is well-developed and well-groomed. She is obese. She is not ill-appearing, toxic-appearing or diaphoretic.  Cardiovascular:     Rate and Rhythm: Normal rate  and regular rhythm.     Pulses: Normal pulses.          Radial pulses are 2+ on the right side and 2+ on the left side.       Posterior tibial pulses are 2+ on the right side and 2+ on the left side.     Heart sounds: Normal heart sounds. No murmur heard.    No gallop.     Comments: Bilateral calf circumference 13.5 inches  Pulmonary:     Effort: Pulmonary effort is normal. No tachypnea, bradypnea or respiratory distress.     Breath sounds: No stridor, decreased air movement or transmitted upper airway sounds. Examination of the right-upper field reveals wheezing. Examination of the left-upper field reveals wheezing. Examination of the right-middle field reveals wheezing. Examination of the left-middle field reveals wheezing. Examination of the right-lower field reveals wheezing. Examination of the left-lower field reveals wheezing. Wheezing present. No decreased breath sounds, rhonchi or rales.  Musculoskeletal:     Cervical back: Full passive range of motion without pain and neck supple.     Right lower leg: 1+ Edema present.     Left lower leg: 2+ Edema present.  Skin:    General: Skin is warm.     Capillary Refill: Capillary refill takes less than 2 seconds.  Neurological:     General: No focal deficit present.     Mental Status: She is alert, oriented to person, place, and time and easily aroused. Mental status is at baseline.     GCS: GCS eye subscore is 4. GCS verbal subscore is 5. GCS motor subscore is 6.     Motor: No weakness.  Psychiatric:        Attention and Perception: Attention and perception normal.        Mood and  Affect: Mood and affect normal.        Speech: Speech normal.        Behavior: Behavior normal. Behavior is cooperative.        Thought Content: Thought content normal. Thought content does not include homicidal or suicidal ideation. Thought content does not include homicidal or suicidal plan.        Cognition and Memory: Cognition and memory normal.        Judgment: Judgment normal.    Results for orders placed or performed in visit on 09/03/22  CBC with Differential/Platelet  Result Value Ref Range   WBC 7.0 3.4 - 10.8 x10E3/uL   RBC 4.48 3.77 - 5.28 x10E6/uL   Hemoglobin 13.5 11.1 - 15.9 g/dL   Hematocrit 16.1 09.6 - 46.6 %   MCV 93 79 - 97 fL   MCH 30.1 26.6 - 33.0 pg   MCHC 32.5 31.5 - 35.7 g/dL   RDW 04.5 40.9 - 81.1 %   Platelets 201 150 - 450 x10E3/uL   Neutrophils 50 Not Estab. %   Lymphs 35 Not Estab. %   Monocytes 8 Not Estab. %   Eos 4 Not Estab. %   Basos 2 Not Estab. %   Neutrophils Absolute 3.5 1.4 - 7.0 x10E3/uL   Lymphocytes Absolute 2.4 0.7 - 3.1 x10E3/uL   Monocytes Absolute 0.6 0.1 - 0.9 x10E3/uL   EOS (ABSOLUTE) 0.3 0.0 - 0.4 x10E3/uL   Basophils Absolute 0.2 0.0 - 0.2 x10E3/uL   Immature Granulocytes 1 Not Estab. %   Immature Grans (Abs) 0.1 0.0 - 0.1 x10E3/uL  CMP14+EGFR  Result Value Ref Range   Glucose 84 70 - 99  mg/dL   BUN 27 8 - 27 mg/dL   Creatinine, Ser 4.09 (H) 0.57 - 1.00 mg/dL   eGFR 32 (L) >81 XB/JYN/8.29   BUN/Creatinine Ratio 15 12 - 28   Sodium 143 134 - 144 mmol/L   Potassium 4.6 3.5 - 5.2 mmol/L   Chloride 102 96 - 106 mmol/L   CO2 26 20 - 29 mmol/L   Calcium 9.8 8.7 - 10.3 mg/dL   Total Protein 6.8 6.0 - 8.5 g/dL   Albumin 4.3 3.9 - 4.9 g/dL   Globulin, Total 2.5 1.5 - 4.5 g/dL   Bilirubin Total 0.3 0.0 - 1.2 mg/dL   Alkaline Phosphatase 75 44 - 121 IU/L   AST 30 0 - 40 IU/L   ALT 22 0 - 32 IU/L  Lipid panel  Result Value Ref Range   Cholesterol, Total 124 100 - 199 mg/dL   Triglycerides 562 0 - 149 mg/dL   HDL 37 (L) >13  mg/dL   VLDL Cholesterol Cal 23 5 - 40 mg/dL   LDL Chol Calc (NIH) 64 0 - 99 mg/dL   Chol/HDL Ratio 3.4 0.0 - 4.4 ratio  Thyroid Panel With TSH  Result Value Ref Range   TSH 4.880 (H) 0.450 - 4.500 uIU/mL   T4, Total 5.2 4.5 - 12.0 ug/dL   T3 Uptake Ratio 20 (L) 24 - 39 %   Free Thyroxine Index 1.0 (L) 1.2 - 4.9  VITAMIN D 25 Hydroxy (Vit-D Deficiency, Fractures)  Result Value Ref Range   Vit D, 25-Hydroxy 45.5 30.0 - 100.0 ng/mL  Cytology - PAP  Result Value Ref Range   Adequacy      Satisfactory for evaluation. The presence or absence of an   Adequacy      endocervical/transformation zone component cannot be determined because   Adequacy of atrophy.    Diagnosis      - Negative for intraepithelial lesion or malignancy (NILM)         10/19/2022   12:19 PM 09/03/2022   10:45 AM 03/05/2022   10:34 AM 09/01/2021   10:14 AM 02/28/2021   12:12 PM  Depression screen PHQ 2/9  Decreased Interest 1 1 0 0 1  Down, Depressed, Hopeless 1 1 0 0 0  PHQ - 2 Score 2 2 0 0 1  Altered sleeping 0 2 0 0 1  Tired, decreased energy 2 2 0 0 2  Change in appetite 1 2 0 0 0  Feeling bad or failure about yourself  0 0 0 0 0  Trouble concentrating 0 1 0 0 0  Moving slowly or fidgety/restless 1 0 0 0 1  Suicidal thoughts 0 0 0 0 0  PHQ-9 Score 6 9 0 0 5  Difficult doing work/chores Somewhat difficult Somewhat difficult Not difficult at all Not difficult at all Somewhat difficult       10/19/2022   12:19 PM 09/03/2022   10:45 AM 03/05/2022   10:34 AM 09/01/2021   10:14 AM  GAD 7 : Generalized Anxiety Score  Nervous, Anxious, on Edge 1 1 0 0  Control/stop worrying 1 1 0 0  Worry too much - different things 1 1 0 0  Trouble relaxing 1 1 0 0  Restless 1 1 0 0  Easily annoyed or irritable 1 1 0 0  Afraid - awful might happen 1 1 0 0  Total GAD 7 Score 7 7 0 0  Anxiety Difficulty Somewhat difficult Somewhat difficult Not difficult at all  Not difficult at all   Pertinent labs & imaging results that were  available during my care of the patient were reviewed by me and considered in my medical decision making.  Assessment & Plan:  Stacie Solis was seen today for leg swelling.  Diagnoses and all orders for this visit:  Peripheral edema Instructed patient to take lasix BID for 1 week due to GFR will cautiously increase diuretic. Will refer to cardiology as below for additional evaluation of persistent edema. Labs as below. Will communicate results to patient once available. Will await results to determine next steps.  Close follow up established with PCP.  -     furosemide (LASIX) 40 MG tablet; Take 1 tablet (40 mg total) by mouth daily. -     Ambulatory referral to Cardiology -     CMP14+EGFR; Future  Wheezing Xray not available in office today to evaluate for pulmonary edema. CXR completed 09/06/22 normal with no active cardiopulmonary disease. Will refill inhaler as below. Referral as above for evaluation and recommendations.  -     albuterol (VENTOLIN HFA) 108 (90 Base) MCG/ACT inhaler; INHALE 2 PUFFS EVERY 4 HOURS AS NEEDED  Continue all other maintenance medications.  Follow up plan: Return if symptoms worsen or fail to improve.  Continue healthy lifestyle choices, including diet (rich in fruits, vegetables, and lean proteins, and low in salt and simple carbohydrates) and exercise (at least 30 minutes of moderate physical activity daily).  Written and verbal instructions provided   The above assessment and management plan was discussed with the patient. The patient verbalized understanding of and has agreed to the management plan. Patient is aware to call the clinic if they develop any new symptoms or if symptoms persist or worsen. Patient is aware when to return to the clinic for a follow-up visit. Patient educated on when it is appropriate to go to the emergency department.   Neale Burly, DNP-FNP [facility] [address] [place], [place] [address] [phone]

## 2022-10-19 NOTE — Patient Instructions (Signed)
Take additional dose of Lasix in the afternoons every other day for 1 week  Return for labs in one week

## 2022-11-05 ENCOUNTER — Encounter: Payer: Self-pay | Admitting: Nurse Practitioner

## 2022-11-05 ENCOUNTER — Ambulatory Visit: Payer: 59 | Admitting: Nurse Practitioner

## 2022-11-28 ENCOUNTER — Encounter: Payer: Self-pay | Admitting: Internal Medicine

## 2022-11-28 ENCOUNTER — Ambulatory Visit: Payer: 59 | Attending: Internal Medicine | Admitting: Internal Medicine

## 2022-11-28 VITALS — BP 128/80 | HR 79 | Ht 62.5 in | Wt 197.8 lb

## 2022-11-28 DIAGNOSIS — N183 Chronic kidney disease, stage 3 unspecified: Secondary | ICD-10-CM | POA: Insufficient documentation

## 2022-11-28 DIAGNOSIS — N1832 Chronic kidney disease, stage 3b: Secondary | ICD-10-CM

## 2022-11-28 DIAGNOSIS — M7989 Other specified soft tissue disorders: Secondary | ICD-10-CM

## 2022-11-28 DIAGNOSIS — I82409 Acute embolism and thrombosis of unspecified deep veins of unspecified lower extremity: Secondary | ICD-10-CM

## 2022-11-28 DIAGNOSIS — R0609 Other forms of dyspnea: Secondary | ICD-10-CM

## 2022-11-28 DIAGNOSIS — Z79899 Other long term (current) drug therapy: Secondary | ICD-10-CM | POA: Diagnosis not present

## 2022-11-28 DIAGNOSIS — I509 Heart failure, unspecified: Secondary | ICD-10-CM | POA: Diagnosis not present

## 2022-11-28 MED ORDER — FUROSEMIDE 40 MG PO TABS: 60.0000 mg | ORAL_TABLET | Freq: Two times a day (BID) | ORAL | 2 refills | Status: DC

## 2022-11-28 NOTE — Progress Notes (Signed)
Cardiology Office Note  Date: 11/28/2022   ID: Stacie Solis, DOB [birth year], MRN [id number]  PCP:  Bennie Pierini, FNP  Cardiologist:  Marjo Bicker, MD Electrophysiologist:  None   History of Present Illness: Stacie Solis is a 62 y.o. female known to have HLD was referred to cardiology clinic for evaluation of bilateral lower EXTR swelling.  No DOE associated bilateral lower EXTR swelling for the last 1 month.  Sleeps in recliner due to back issues.  She was started on p.o. Lasix 40 mg with improvement in swelling but she regained all of the swelling back along with 10 pound weight gain in the last 1 to 2 weeks.  Lasix although initially worked did not work anymore.  Her serum creatinine was 1.7 recently.  No angina, dizziness, presyncope, syncope and palpitations.  Past Medical History:  Diagnosis Date   Allergy    Anxiety    Arthritis    Chronic back pain    Depression    GERD (gastroesophageal reflux disease)    Hyperlipidemia     Past Surgical History:  Procedure Laterality Date   BRAIN SURGERY     trig neuralgia   BREAST BIOPSY Right 11/07/2020   fibroadenoma   CHOLECYSTECTOMY      Current Outpatient Medications  Medication Sig Dispense Refill   albuterol (VENTOLIN HFA) 108 (90 Base) MCG/ACT inhaler INHALE 2 PUFFS EVERY 4 HOURS AS NEEDED 8.5 each 2   atorvastatin (LIPITOR) 40 MG tablet Take 1 tablet (40 mg total) by mouth daily. 90 tablet 1   buPROPion (WELLBUTRIN XL) 150 MG 24 hr tablet TAKE 1 TABLET BY MOUTH EVERY DAY 90 tablet 1   busPIRone (BUSPAR) 5 MG tablet Take 1 tablet (5 mg total) by mouth 3 (three) times daily. 90 tablet 5   cetirizine (ZYRTEC) 10 MG tablet Take 1 tablet (10 mg total) by mouth daily. 30 tablet 11   Cholecalciferol (VITAMIN D) 50 MCG (2000 UT) tablet Take 2,000 Units by mouth daily.     escitalopram (LEXAPRO) 20 MG tablet TAKE 1 TABLET BY MOUTH EVERY DAY 90 tablet 1   fenofibrate micronized (LOFIBRA) 134 MG capsule TAKE 1  CAPSULE BY MOUTH EVERY DAY BEFORE BREAKFAST 90 capsule 1   fish oil-omega-3 fatty acids 1000 MG capsule Take 1 g by mouth daily.     furosemide (LASIX) 40 MG tablet Take 1.5 tablets (60 mg total) by mouth 2 (two) times daily. 135 tablet 2   meloxicam (MOBIC) 15 MG tablet Take 15 mg by mouth daily as needed for pain.     metaxalone (SKELAXIN) 800 MG tablet Take 800 mg by mouth 3 (three) times daily.     naloxone (NARCAN) nasal spray 4 mg/0.1 mL      omeprazole (PRILOSEC) 40 MG capsule Take 1 capsule (40 mg total) by mouth daily. 90 capsule 1   PERCOCET 5-325 MG tablet Take 1 tablet by mouth 4 (four) times daily as needed for moderate pain.     pramipexole (MIRAPEX) 0.5 MG tablet Take 1 tablet (0.5 mg total) by mouth 3 (three) times daily. 270 tablet 1   triamcinolone (NASACORT) 55 MCG/ACT AERO nasal inhaler PLACE 2 SPRAYS INTO THE NOSE DAILY. 16.9 each 0   Vaginal Lubricant (REPLENS) GEL Place 1 Application vaginally daily as needed (moisture/irritation).     No current facility-administered medications for this visit.   Allergies:  Sulfa antibiotics   Social History: The patient  reports that she has been smoking cigarettes.  She has a 48 pack-year smoking history. She has never used smokeless tobacco. She reports that she does not drink alcohol and does not use drugs.   Family History: The patient's family history includes Arthritis in her mother; Diabetes in her mother; Hypertension in her mother; Lung cancer in her maternal grandfather.   ROS:  Please see the history of present illness. Otherwise, complete review of systems is positive for none  All other systems are reviewed and negative.   Physical Exam: VS:  BP 128/80   Pulse 79   Ht 5' 2.5" (1.588 m)   Wt 197 lb 12.8 oz (89.7 kg)   SpO2 93%   BMI 35.60 kg/m , BMI Body mass index is 35.6 kg/m.  Wt Readings from Last 3 Encounters:  11/28/22 197 lb 12.8 oz (89.7 kg)  10/19/22 187 lb (84.8 kg)  09/03/22 187 lb (84.8 kg)     General: Patient appears comfortable at rest. HEENT: Conjunctiva and lids normal, oropharynx clear with moist mucosa. Neck: Supple, no elevated JVP or carotid bruits, no thyromegaly. Lungs: Clear to auscultation, nonlabored breathing at rest. Cardiac: Regular rate and rhythm, no S3 or significant systolic murmur, no pericardial rub. Abdomen: Soft, nontender, no hepatomegaly, bowel sounds present, no guarding or rebound. Extremities: 2+ pitting edema, erythematous patches present Skin: Warm and dry. Musculoskeletal: No kyphosis. Neuropsychiatric: Alert and oriented x3, affect grossly appropriate.  Recent Labwork: 09/03/2022: ALT 22; AST 30; BUN 27; Creatinine, Ser 1.78; Hemoglobin 13.5; Platelets 201; Potassium 4.6; Sodium 143; TSH 4.880     Component Value Date/Time   CHOL 124 09/03/2022 1122   TRIG 129 09/03/2022 1122   TRIG 83 06/08/2014 1155   HDL 37 (L) 09/03/2022 1122   HDL 39 (L) 06/08/2014 1155   CHOLHDL 3.4 09/03/2022 1122   LDLCALC 64 09/03/2022 1122     Assessment and Plan:  Congestive heart failure: DOE associated with bilateral lower EXTR swelling x 1 month.  Sleeps in recliner for many years due to back issues.  On p.o. Lasix 40 mg once daily which helped with the swelling but she regained all of it back with 10 pound weight gain in the last 1 to 2 weeks.  Obtain 2D echocardiogram, increase p.o. Lasix to 60 mg twice daily, obtain ultrasound venous Doppler lower extremities to rule out DVT.  Obtain BNP today and BMP in 5 days.  CKD stage IIIb: Will repeat BMP after increasing Lasix, will probably need nephrology referral and kidney labs to rule out proteinuria.  HLD, at goal: Continue atorvastatin 40 mg nightly., goal LDL is <100.  Follows with PCP.       Medication Adjustments/Labs and Tests Ordered: Current medicines are reviewed at length with the patient today.  Concerns regarding medicines are outlined above.    Disposition:  Follow up  1  month  Signed Laurieann Friddle Verne Spurr, MD, 11/28/2022 2:53 PM    [facility] [address] [place], [place], [place] [address]

## 2022-11-28 NOTE — Patient Instructions (Addendum)
Medication Instructions:  Your physician has recommended you make the following change in your medication:  Increase Lasix from 40 mg daily to 60 mg twice daily Continue taking all other medications as prescribed  Labwork: BMP in 5 days at [facility]  Testing/Procedures: Your physician has requested that you have an echocardiogram. Echocardiography is a painless test that uses sound waves to create images of your heart. It provides your doctor with information about the size and shape of your heart and how well your heart's chambers and valves are working. This procedure takes approximately one hour. There are no restrictions for this procedure. Please do NOT wear cologne, perfume, aftershave, or lotions (deodorant is allowed). Please arrive 15 minutes prior to your appointment time.  Your physician has requested that you have a lower or upper extremity venous duplex. This test is an ultrasound of the veins in the legs or arms. It looks at venous blood flow that carries blood from the heart to the legs or arms. Allow one hour for a Lower Venous exam. Allow thirty minutes for an Upper Venous exam. There are no restrictions or special instructions.    Follow-Up: Your physician recommends that you schedule a follow-up appointment in: 1 month follow up  Any Other Special Instructions Will Be Listed Below (If Applicable).  If you need a refill on your cardiac medications before your next appointment, please call your pharmacy.

## 2022-12-11 ENCOUNTER — Other Ambulatory Visit: Payer: 59

## 2022-12-11 ENCOUNTER — Ambulatory Visit: Payer: 59 | Attending: Internal Medicine

## 2022-12-11 DIAGNOSIS — I82409 Acute embolism and thrombosis of unspecified deep veins of unspecified lower extremity: Secondary | ICD-10-CM

## 2022-12-11 DIAGNOSIS — M7989 Other specified soft tissue disorders: Secondary | ICD-10-CM

## 2022-12-12 ENCOUNTER — Telehealth: Payer: Self-pay

## 2022-12-12 ENCOUNTER — Telehealth: Payer: Self-pay | Admitting: Internal Medicine

## 2022-12-12 NOTE — Telephone Encounter (Signed)
-----   Message from Vishnu P Mallipeddi sent at 12/06/2022  3:22 PM EDT ----- Pro-BNP significantly elevated, 485 (normal should be less than 125).  Continue p.o. Lasix 60 mg twice daily.  Will probably need to go up on the dose if patient does not make enough urine output.

## 2022-12-12 NOTE — Telephone Encounter (Signed)
Patient is inquiring on what is causing her Pro-BNP significantly elevated, 485? Did advise patient to continue taking lasix as prescribed as well.

## 2022-12-12 NOTE — Telephone Encounter (Signed)
Patient informed and verbalized understanding of plan. Sent provided message regarding further questions from patient in regards to lab results.

## 2022-12-12 NOTE — Telephone Encounter (Signed)
Pt is returning call to get lab result

## 2022-12-12 NOTE — Telephone Encounter (Signed)
  She requested to call her at [phone]

## 2022-12-12 NOTE — Telephone Encounter (Signed)
Per provider response: Stretching of her cardiac chambers due to fluid in the heart is causing elevated BNP. Yes take lasix as prescribed.  Patient has an echo next week and will see what those results are. Patient verbalized understanding.

## 2022-12-17 ENCOUNTER — Telehealth: Payer: Self-pay | Admitting: *Deleted

## 2022-12-17 DIAGNOSIS — I509 Heart failure, unspecified: Secondary | ICD-10-CM

## 2022-12-17 DIAGNOSIS — Z79899 Other long term (current) drug therapy: Secondary | ICD-10-CM

## 2022-12-17 NOTE — Telephone Encounter (Signed)
-----   Message from Vishnu P Mallipeddi sent at 12/14/2022  1:07 PM EDT ----- Serum creatinine increased from 1.7 to 1.9.  Repeat BMP in 1 week.

## 2022-12-18 ENCOUNTER — Ambulatory Visit: Payer: 59 | Attending: Internal Medicine

## 2022-12-18 DIAGNOSIS — M7989 Other specified soft tissue disorders: Secondary | ICD-10-CM | POA: Diagnosis not present

## 2022-12-18 DIAGNOSIS — R0609 Other forms of dyspnea: Secondary | ICD-10-CM

## 2022-12-18 LAB — ECHOCARDIOGRAM COMPLETE
AR max vel: 2.75 cm2
AV Area VTI: 2.8 cm2
AV Area mean vel: 2.71 cm2
AV Mean grad: 4 mm[Hg]
AV Peak grad: 6.9 mm[Hg]
Ao pk vel: 1.31 m/s
Area-P 1/2: 2.74 cm2
Calc EF: 65.6 %
MV VTI: 2.09 cm2
S' Lateral: 2.9 cm
Single Plane A2C EF: 62.6 %
Single Plane A4C EF: 67.8 %

## 2022-12-20 ENCOUNTER — Ambulatory Visit: Payer: 59 | Admitting: Nurse Practitioner

## 2022-12-24 ENCOUNTER — Telehealth: Payer: Self-pay

## 2022-12-24 DIAGNOSIS — Z79899 Other long term (current) drug therapy: Secondary | ICD-10-CM

## 2022-12-24 DIAGNOSIS — R809 Proteinuria, unspecified: Secondary | ICD-10-CM

## 2022-12-24 DIAGNOSIS — I509 Heart failure, unspecified: Secondary | ICD-10-CM

## 2022-12-24 NOTE — Telephone Encounter (Signed)
Patient informed and verbalized understanding of plan. 

## 2022-12-24 NOTE — Telephone Encounter (Signed)
-----   Message from Vishnu P Mallipeddi sent at 12/21/2022 12:23 PM EDT ----- Kidney function worsened, serum creatinine worsened from 1.7 to 2.3. Hold lasix and repeat BMP in 1 week.. Place referral to nephrology to obtain proteinuria labs.

## 2023-01-01 ENCOUNTER — Other Ambulatory Visit: Payer: Self-pay | Admitting: Nurse Practitioner

## 2023-01-01 ENCOUNTER — Other Ambulatory Visit: Payer: 59

## 2023-01-01 DIAGNOSIS — I509 Heart failure, unspecified: Secondary | ICD-10-CM

## 2023-01-01 DIAGNOSIS — Z79899 Other long term (current) drug therapy: Secondary | ICD-10-CM | POA: Diagnosis not present

## 2023-01-01 DIAGNOSIS — R809 Proteinuria, unspecified: Secondary | ICD-10-CM

## 2023-01-01 MED ORDER — LIDOCAINE VISCOUS HCL 2 % MT SOLN: 5.0000 mL | Freq: Four times a day (QID) | OROMUCOSAL | 1 refills | Status: DC

## 2023-01-02 ENCOUNTER — Telehealth: Payer: Self-pay

## 2023-01-02 LAB — BASIC METABOLIC PANEL
BUN/Creatinine Ratio: 14 (ref 12–28)
BUN: 19 mg/dL (ref 8–27)
CO2: 21 mmol/L (ref 20–29)
Calcium: 9.8 mg/dL (ref 8.7–10.3)
Chloride: 106 mmol/L (ref 96–106)
Creatinine, Ser: 1.39 mg/dL — ABNORMAL HIGH (ref 0.57–1.00)
Glucose: 85 mg/dL (ref 70–99)
Potassium: 4.6 mmol/L (ref 3.5–5.2)
Sodium: 143 mmol/L (ref 134–144)
eGFR: 42 mL/min/{1.73_m2} — ABNORMAL LOW (ref >59)

## 2023-01-02 NOTE — Telephone Encounter (Signed)
-----   Message from Vishnu P Mallipeddi sent at 01/02/2023  8:54 AM EST ----- Serum creatinine significantly improved and currently is at baseline after holding Lasix.  Take p.o. Lasix as needed for SOB/LE swelling.

## 2023-01-02 NOTE — Telephone Encounter (Signed)
Patient informed and verbalized understanding of plan. 

## 2023-02-05 ENCOUNTER — Encounter: Payer: Self-pay | Admitting: Internal Medicine

## 2023-02-05 ENCOUNTER — Other Ambulatory Visit: Payer: Self-pay

## 2023-02-05 DIAGNOSIS — R809 Proteinuria, unspecified: Secondary | ICD-10-CM

## 2023-02-05 NOTE — Progress Notes (Signed)
error 

## 2023-02-12 DIAGNOSIS — Z79899 Other long term (current) drug therapy: Secondary | ICD-10-CM | POA: Diagnosis not present

## 2023-02-12 DIAGNOSIS — Z5181 Encounter for therapeutic drug level monitoring: Secondary | ICD-10-CM | POA: Diagnosis not present

## 2023-02-25 ENCOUNTER — Encounter: Payer: Self-pay | Admitting: Family

## 2023-02-25 ENCOUNTER — Telehealth: Payer: Self-pay

## 2023-02-25 ENCOUNTER — Telehealth (INDEPENDENT_AMBULATORY_CARE_PROVIDER_SITE_OTHER): Payer: 59 | Admitting: Family

## 2023-02-25 DIAGNOSIS — Z91199 Patient's noncompliance with other medical treatment and regimen due to unspecified reason: Secondary | ICD-10-CM

## 2023-02-25 NOTE — Telephone Encounter (Signed)
Copied from CRM [id number]. Topic: Clinical - Medical Advice >> Feb 25, 2023  3:06 PM Clayton Bibles wrote: Reason for CRM: Tateyana wants to see if Dr. Daphine Deutscher would call her in something for a sinus infection. Caraline's sister is in the hospital with cancer and Dene feels she should not leave her. Please call Nadezhda if needed at [phone]

## 2023-02-25 NOTE — Telephone Encounter (Signed)
Appt made

## 2023-02-26 ENCOUNTER — Telehealth (INDEPENDENT_AMBULATORY_CARE_PROVIDER_SITE_OTHER): Payer: 59 | Admitting: Family

## 2023-02-26 ENCOUNTER — Encounter: Payer: Self-pay | Admitting: Family

## 2023-02-26 DIAGNOSIS — J019 Acute sinusitis, unspecified: Secondary | ICD-10-CM

## 2023-02-26 DIAGNOSIS — J441 Chronic obstructive pulmonary disease with (acute) exacerbation: Secondary | ICD-10-CM | POA: Diagnosis not present

## 2023-02-26 MED ORDER — AMOXICILLIN-POT CLAVULANATE 875-125 MG PO TABS: 1.0000 | ORAL_TABLET | Freq: Two times a day (BID) | ORAL | 0 refills | Status: DC

## 2023-02-26 MED ORDER — FLUTICASONE PROPIONATE 50 MCG/ACT NA SUSP: 2.0000 | Freq: Every day | NASAL | 6 refills | Status: AC

## 2023-02-26 MED ORDER — PREDNISONE 10 MG (21) PO TBPK
ORAL_TABLET | ORAL | 0 refills | Status: DC
Start: 2023-02-26 — End: 2023-03-08

## 2023-02-26 MED ORDER — BENZONATATE 200 MG PO CAPS: 200.0000 mg | ORAL_CAPSULE | Freq: Three times a day (TID) | ORAL | 1 refills | Status: DC | PRN

## 2023-02-26 NOTE — Patient Instructions (Signed)

## 2023-02-26 NOTE — Telephone Encounter (Signed)
 Copied from CRM [id number]. Topic: Appointments - Appointment Info/Confirmation >> Feb 25, 2023  5:14 PM Elle L wrote: The patient missed her video appointment for 12/30 as she thought it was a call. I rescheduled her for tomorrow and she is requesting for it to be a call instead of a MyChart visit if possible.

## 2023-02-26 NOTE — Telephone Encounter (Signed)
 Copied from CRM [id number]. Topic: Appointments - Appointment Info/Confirmation >> Feb 25, 2023  5:14 PM Stacie Solis wrote: The patient missed her video appointment for 12/30 as she thought it was a call. I rescheduled her for tomorrow and she is requesting for it to be a call instead of a MyChart visit if possible.

## 2023-02-26 NOTE — Progress Notes (Signed)
 Virtual Visit Consent   [name], you are scheduled for a virtual visit with a [facility] provider today. Just as with appointments in the office, your consent must be obtained to participate. Your consent will be active for this visit and any virtual visit you may have with one of our providers in the next 365 days. If you have a MyChart account, a copy of this consent can be sent to you electronically.  As this is a virtual visit, video technology does not allow for your provider to perform a traditional examination. This may limit your provider's ability to fully assess your condition. If your provider identifies any concerns that need to be evaluated in person or the need to arrange testing (such as labs, EKG, etc.), we will make arrangements to do so. Although advances in technology are sophisticated, we cannot ensure that it will always work on either your end or our end. If the connection with a video visit is poor, the visit may have to be switched to a telephone visit. With either a video or telephone visit, we are not always able to ensure that we have a secure connection.  By engaging in this virtual visit, you consent to the provision of healthcare and authorize for your insurance to be billed (if applicable) for the services provided during this visit. Depending on your insurance coverage, you may receive a charge related to this service.  I need to obtain your verbal consent now. Are you willing to proceed with your visit today? Stacie Solis Solis has provided verbal consent on 02/26/2023 for a virtual visit (video or telephone). Bari Learn, FNP  Date: 02/26/2023 8:48 AM  Virtual Visit via Video Note   I, Bari Learn, connected with  Stacie Solis Solis  ([id number], 03/25/1960) on 02/26/23 at  8:10 AM EST by a video-enabled telemedicine application and verified that I am speaking with the correct person using two identifiers.  Location: Patient: Virtual Visit Location Patient:  Home Provider: Virtual Visit Location Provider: Home Office   I discussed the limitations of evaluation and management by telemedicine and the availability of in person appointments. The patient expressed understanding and agreed to proceed.    History of Present Illness: Stacie Solis Solis is a 62 y.o. who identifies as a female who was assigned female at birth, and is being seen today for sinus congestion and cough that started over a week ago.  HPI: Sinusitis This is a new problem. The current episode started 1 to 4 weeks ago. The problem has been gradually worsening since onset. There has been no fever. Her pain is at a severity of 6/10. The pain is moderate. Associated symptoms include congestion, coughing, ear pain, headaches, a hoarse voice, shortness of breath, sinus pressure, sneezing and a sore throat. Past treatments include oral decongestants and acetaminophen . The treatment provided mild relief.    Problems:  Patient Active Problem List   Diagnosis Date Noted   Congestive heart failure (CHF) (HCC) 11/28/2022   Chronic kidney disease (CKD), stage III (moderate) (HCC) 11/28/2022   Peripheral edema 09/03/2022   Chronic lower back pain 06/08/2014   Smoker 03/03/2014   BMI 35.0-35.9,adult 03/03/2014   RLS (restless legs syndrome) 03/03/2014   Insomnia 03/03/2014   Depression 01/05/2013   GERD (gastroesophageal reflux disease) 09/11/2012   Hyperlipidemia 09/11/2012   Panic attacks 09/11/2012   Trigeminal neuralgia 09/11/2012    Allergies:  Allergies  Allergen Reactions   Sulfa Antibiotics Nausea Only   Medications:  Current Outpatient Medications:    amoxicillin -clavulanate (AUGMENTIN ) 875-125 MG tablet, Take 1 tablet by mouth 2 (two) times daily., Disp: 14 tablet, Rfl: 0   benzonatate  (TESSALON ) 200 MG capsule, Take 1 capsule (200 mg total) by mouth 3 (three) times daily as needed., Disp: 30 capsule, Rfl: 1   fluticasone  (FLONASE ) 50 MCG/ACT nasal spray, Place 2 sprays into  both nostrils daily., Disp: 16 g, Rfl: 6   magic mouthwash (lidocaine , diphenhydrAMINE, alum & mag hydroxide) suspension, Swish and swallow 5 mLs 4 (four) times daily., Disp: 360 mL, Rfl: 1   predniSONE  (STERAPRED UNI-PAK 21 TAB) 10 MG (21) TBPK tablet, Use as directed, Disp: 21 tablet, Rfl: 0   albuterol  (VENTOLIN  HFA) 108 (90 Base) MCG/ACT inhaler, INHALE 2 PUFFS EVERY 4 HOURS AS NEEDED, Disp: 8.5 each, Rfl: 2   atorvastatin  (LIPITOR) 40 MG tablet, Take 1 tablet (40 mg total) by mouth daily., Disp: 90 tablet, Rfl: 1   buPROPion  (WELLBUTRIN  XL) 150 MG 24 hr tablet, TAKE 1 TABLET BY MOUTH EVERY DAY, Disp: 90 tablet, Rfl: 1   busPIRone  (BUSPAR ) 5 MG tablet, Take 1 tablet (5 mg total) by mouth 3 (three) times daily., Disp: 90 tablet, Rfl: 5   cetirizine  (ZYRTEC ) 10 MG tablet, Take 1 tablet (10 mg total) by mouth daily., Disp: 30 tablet, Rfl: 11   Cholecalciferol (VITAMIN D ) 50 MCG (2000 UT) tablet, Take 2,000 Units by mouth daily., Disp: , Rfl:    escitalopram  (LEXAPRO ) 20 MG tablet, TAKE 1 TABLET BY MOUTH EVERY DAY, Disp: 90 tablet, Rfl: 1   fenofibrate  micronized (LOFIBRA) 134 MG capsule, TAKE 1 CAPSULE BY MOUTH EVERY DAY BEFORE BREAKFAST, Disp: 90 capsule, Rfl: 1   fish oil-omega-3 fatty acids 1000 MG capsule, Take 1 g by mouth daily., Disp: , Rfl:    furosemide  (LASIX ) 40 MG tablet, Take 1.5 tablets (60 mg total) by mouth 2 (two) times daily., Disp: 135 tablet, Rfl: 2   meloxicam  (MOBIC ) 15 MG tablet, Take 15 mg by mouth daily as needed for pain., Disp: , Rfl:    metaxalone (SKELAXIN) 800 MG tablet, Take 800 mg by mouth 3 (three) times daily., Disp: , Rfl:    naloxone (NARCAN) nasal spray 4 mg/0.1 mL, , Disp: , Rfl:    omeprazole  (PRILOSEC) 40 MG capsule, Take 1 capsule (40 mg total) by mouth daily., Disp: 90 capsule, Rfl: 1   PERCOCET 5-325 MG tablet, Take 1 tablet by mouth 4 (four) times daily as needed for moderate pain., Disp: , Rfl:    pramipexole  (MIRAPEX ) 0.5 MG tablet, Take 1 tablet (0.5  mg total) by mouth 3 (three) times daily., Disp: 270 tablet, Rfl: 1   triamcinolone  (NASACORT ) 55 MCG/ACT AERO nasal inhaler, PLACE 2 SPRAYS INTO THE NOSE DAILY., Disp: 16.9 each, Rfl: 0   Vaginal Lubricant (REPLENS) GEL, Place 1 Application vaginally daily as needed (moisture/irritation)., Disp: , Rfl:   Observations/Objective: Patient is well-developed, well-nourished in no acute distress.  Resting comfortably  at home.  Head is normocephalic, atraumatic.  No labored breathing.  Speech is clear and coherent with logical content.  Patient is alert and oriented at baseline.  Nasal congestion Coarse nonproductive cough  Assessment and Plan: 1. COPD exacerbation (HCC) (Primary) - predniSONE  (STERAPRED UNI-PAK 21 TAB) 10 MG (21) TBPK tablet; Use as directed  Dispense: 21 tablet; Refill: 0 - benzonatate  (TESSALON ) 200 MG capsule; Take 1 capsule (200 mg total) by mouth 3 (three) times daily as needed.  Dispense: 30 capsule; Refill: 1 -  fluticasone  (FLONASE ) 50 MCG/ACT nasal spray; Place 2 sprays into both nostrils daily.  Dispense: 16 g; Refill: 6  2. Acute non-recurrent sinusitis, unspecified location - predniSONE  (STERAPRED UNI-PAK 21 TAB) 10 MG (21) TBPK tablet; Use as directed  Dispense: 21 tablet; Refill: 0 - benzonatate  (TESSALON ) 200 MG capsule; Take 1 capsule (200 mg total) by mouth 3 (three) times daily as needed.  Dispense: 30 capsule; Refill: 1 - fluticasone  (FLONASE ) 50 MCG/ACT nasal spray; Place 2 sprays into both nostrils daily.  Dispense: 16 g; Refill: 6 - amoxicillin -clavulanate (AUGMENTIN ) 875-125 MG tablet; Take 1 tablet by mouth 2 (two) times daily.  Dispense: 14 tablet; Refill: 0  - Take meds as prescribed - Use a cool mist humidifier  -Use saline nose sprays frequently -Force fluids -For any cough or congestion  Use plain Mucinex - regular strength or max strength is fine -For fever or aces or pains- take tylenol  or ibuprofen. -Throat lozenges if help -Follow up if  symptoms worsen or do not improve   Follow Up Instructions: I discussed the assessment and treatment plan with the patient. The patient was provided an opportunity to ask questions and all were answered. The patient agreed with the plan and demonstrated an understanding of the instructions.  A copy of instructions were sent to the patient via MyChart unless otherwise noted below.    The patient was advised to call back or seek an in-person evaluation if the symptoms worsen or if the condition fails to improve as anticipated.    Bari Learn, FNP

## 2023-03-08 ENCOUNTER — Telehealth: Payer: Self-pay

## 2023-03-08 ENCOUNTER — Encounter: Payer: Self-pay | Admitting: Nurse Practitioner

## 2023-03-08 ENCOUNTER — Ambulatory Visit: Payer: 59 | Admitting: Nurse Practitioner

## 2023-03-08 ENCOUNTER — Ambulatory Visit (INDEPENDENT_AMBULATORY_CARE_PROVIDER_SITE_OTHER): Payer: 59

## 2023-03-08 ENCOUNTER — Other Ambulatory Visit (HOSPITAL_COMMUNITY): Payer: Self-pay

## 2023-03-08 ENCOUNTER — Telehealth: Payer: Self-pay | Admitting: Nurse Practitioner

## 2023-03-08 VITALS — BP 107/66 | HR 87 | Temp 97.4°F | Resp 20 | Ht 62.0 in | Wt 196.0 lb

## 2023-03-08 DIAGNOSIS — K219 Gastro-esophageal reflux disease without esophagitis: Secondary | ICD-10-CM

## 2023-03-08 DIAGNOSIS — Z6835 Body mass index (BMI) 35.0-35.9, adult: Secondary | ICD-10-CM

## 2023-03-08 DIAGNOSIS — G500 Trigeminal neuralgia: Secondary | ICD-10-CM

## 2023-03-08 DIAGNOSIS — M79672 Pain in left foot: Secondary | ICD-10-CM

## 2023-03-08 DIAGNOSIS — F5101 Primary insomnia: Secondary | ICD-10-CM | POA: Diagnosis not present

## 2023-03-08 DIAGNOSIS — E782 Mixed hyperlipidemia: Secondary | ICD-10-CM | POA: Diagnosis not present

## 2023-03-08 DIAGNOSIS — M7989 Other specified soft tissue disorders: Secondary | ICD-10-CM | POA: Diagnosis not present

## 2023-03-08 DIAGNOSIS — F17200 Nicotine dependence, unspecified, uncomplicated: Secondary | ICD-10-CM

## 2023-03-08 DIAGNOSIS — F3342 Major depressive disorder, recurrent, in full remission: Secondary | ICD-10-CM | POA: Diagnosis not present

## 2023-03-08 DIAGNOSIS — R600 Localized edema: Secondary | ICD-10-CM

## 2023-03-08 DIAGNOSIS — G2581 Restless legs syndrome: Secondary | ICD-10-CM

## 2023-03-08 DIAGNOSIS — F410 Panic disorder [episodic paroxysmal anxiety] without agoraphobia: Secondary | ICD-10-CM | POA: Diagnosis not present

## 2023-03-08 MED ORDER — OMEPRAZOLE 40 MG PO CPDR: 40.0000 mg | DELAYED_RELEASE_CAPSULE | Freq: Every day | ORAL | 1 refills | Status: DC

## 2023-03-08 MED ORDER — BUPROPION HCL ER (XL) 150 MG PO TB24: 150.0000 mg | ORAL_TABLET | Freq: Every day | ORAL | 1 refills | Status: DC

## 2023-03-08 MED ORDER — ESCITALOPRAM OXALATE 20 MG PO TABS: 20.0000 mg | ORAL_TABLET | Freq: Every day | ORAL | 1 refills | Status: DC

## 2023-03-08 MED ORDER — PRAMIPEXOLE DIHYDROCHLORIDE 0.5 MG PO TABS: 0.5000 mg | ORAL_TABLET | Freq: Three times a day (TID) | ORAL | 1 refills | Status: DC

## 2023-03-08 MED ORDER — FENOFIBRATE MICRONIZED 134 MG PO CAPS: ORAL_CAPSULE | ORAL | 1 refills | Status: DC

## 2023-03-08 MED ORDER — ATORVASTATIN CALCIUM 40 MG PO TABS: 40.0000 mg | ORAL_TABLET | Freq: Every day | ORAL | 1 refills | Status: DC

## 2023-03-08 MED ORDER — FUROSEMIDE 40 MG PO TABS: 60.0000 mg | ORAL_TABLET | Freq: Two times a day (BID) | ORAL | 2 refills | Status: DC

## 2023-03-08 MED ORDER — DICLOFENAC SODIUM 1 % EX GEL: 4.0000 g | Freq: Four times a day (QID) | CUTANEOUS | 1 refills | Status: DC

## 2023-03-08 MED ORDER — BUSPIRONE HCL 5 MG PO TABS: 5.0000 mg | ORAL_TABLET | Freq: Three times a day (TID) | ORAL | 5 refills | Status: DC

## 2023-03-08 NOTE — Telephone Encounter (Signed)
 PA request has been Submitted. New Encounter created for follow up. For additional info see Pharmacy Prior Auth telephone encounter from 03/08/23.

## 2023-03-08 NOTE — Telephone Encounter (Signed)
 Pharmacy Patient Advocate Encounter   Received notification from Pt Calls Messages that prior authorization for Omeprazole  40MG  dr capsules is required/requested.   Insurance verification completed.   The patient is insured through [employer] [facility] .   Per test claim: PA required and submitted KEY/EOC/Request #: [id number] CANCELLED due to: Your PA has been resolved, no additional PA is required  Insurance allows 90 capsules per 365 days. Patient has filled 30 capsules. Patient must fill remaining 60 capsules before we can complete a PA. Next available fill 03/23/23

## 2023-03-08 NOTE — Telephone Encounter (Signed)
 Name from pharmacy: OMEPRAZOLE DR 40 MG CAPSULE   Pharmacy comment: Alternative Requested:INSURANCE WILL ONLY COVER 90 CAPSULES PER YEAR- NEEDS PRIOR AUTHORIZATION.

## 2023-03-08 NOTE — Progress Notes (Signed)
 Subjective:    Patient ID: Stacie Solis, female    DOB: Jul 03, 1960, 63 y.o.   MRN: [id number]  Chief Complaint: medical management of chronic issues     HPI:  Stacie Solis is a 63 y.o. who identifies as a female who was assigned female at birth.   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 Mixed hyperlipidemia Doe snot really watch diet and does no dedicated exercise. Lab Results  Component Value Date   CHOL 124 09/03/2022   HDL 37 (L) 09/03/2022   LDLCALC 64 09/03/2022   TRIG 129 09/03/2022   CHOLHDL 3.4 09/03/2022     2. Gastroesophageal reflux disease without esophagitis Is on achipex and does not work as well as other meds she has been on  3. Trigeminal neuralgia No complaints of pain today   4. Recurrent major depressive disorder, in full remission (HCC) Is on combination of wellbutrin  and lexapro . Says she is doing well right now.     03/08/2023    9:56 AM 10/19/2022   12:19 PM 09/03/2022   10:45 AM  Depression screen PHQ 2/9  Decreased Interest 1 1 1   Down, Depressed, Hopeless 2 1 1   PHQ - 2 Score 3 2 2   Altered sleeping 1 0 2  Tired, decreased energy 1 2 2   Change in appetite 2 1 2   Feeling bad or failure about yourself  1 0 0  Trouble concentrating 2 0 1  Moving slowly or fidgety/restless 1 1 0  Suicidal thoughts 0 0 0  PHQ-9 Score 11 6 9   Difficult doing work/chores Somewhat difficult Somewhat difficult Somewhat difficult     5. Panic attacks Denies any recent panic attacks. Sister is dying which has made things harder on her.    03/08/2023    9:59 AM 10/19/2022   12:19 PM 09/03/2022   10:45 AM 03/05/2022   10:34 AM  GAD 7 : Generalized Anxiety Score  Nervous, Anxious, on Edge 1 1 1  0  Control/stop worrying 2 1 1  0  Worry too much - different things 2 1 1  0  Trouble relaxing 2 1 1  0  Restless 2 1 1  0  Easily annoyed or irritable 2 1 1  0  Afraid -  awful might happen 2 1 1  0  Total GAD 7 Score 13 7 7  0  Anxiety Difficulty Somewhat difficult Somewhat difficult Somewhat difficult Not difficult at all        6. RLS (restless legs syndrome) Is on mirapex  and works well when she is sleeping  7. Chronic midline low back pain without sciatica Back pain is good tyoday  8. Peripheral edema Has daily- never completely resolves   9. Primary insomnia Is on ambien  to sleep, but has not been taking it.. Sleeps about  3-4 hours  10. Smoker Smokes over a pack a day.  11. BMI 35.0-35.9,adult Weight is up 3 lbss  Wt Readings from Last 3 Encounters:  03/08/23 196 lb (88.9 kg)  11/28/22 197 lb 12.8 oz (89.7 kg)  10/19/22 187 lb (84.8 kg)   BMI Readings from Last 3 Encounters:  03/08/23 35.85 kg/m  11/28/22 35.60 kg/m  10/19/22 34.20 kg/m      New complaints:  Left foot pain- has a knot on the lateral side of her left foot. Allergies  Allergen Reactions   Sulfa Antibiotics Nausea Only   Outpatient Encounter Medications  as of 03/08/2023  Medication Sig   magic mouthwash (lidocaine , diphenhydrAMINE, alum & mag hydroxide) suspension Swish and swallow 5 mLs 4 (four) times daily.   albuterol  (VENTOLIN  HFA) 108 (90 Base) MCG/ACT inhaler INHALE 2 PUFFS EVERY 4 HOURS AS NEEDED   amoxicillin -clavulanate (AUGMENTIN ) 875-125 MG tablet Take 1 tablet by mouth 2 (two) times daily.   atorvastatin  (LIPITOR) 40 MG tablet Take 1 tablet (40 mg total) by mouth daily.   benzonatate  (TESSALON ) 200 MG capsule Take 1 capsule (200 mg total) by mouth 3 (three) times daily as needed.   buPROPion  (WELLBUTRIN  XL) 150 MG 24 hr tablet TAKE 1 TABLET BY MOUTH EVERY DAY   busPIRone  (BUSPAR ) 5 MG tablet Take 1 tablet (5 mg total) by mouth 3 (three) times daily.   cetirizine  (ZYRTEC ) 10 MG tablet Take 1 tablet (10 mg total) by mouth daily.   Cholecalciferol (VITAMIN D ) 50 MCG (2000 UT) tablet Take 2,000 Units by mouth daily.   escitalopram  (LEXAPRO ) 20 MG  tablet TAKE 1 TABLET BY MOUTH EVERY DAY   fenofibrate  micronized (LOFIBRA) 134 MG capsule TAKE 1 CAPSULE BY MOUTH EVERY DAY BEFORE BREAKFAST   fish oil-omega-3 fatty acids 1000 MG capsule Take 1 g by mouth daily.   fluticasone  (FLONASE ) 50 MCG/ACT nasal spray Place 2 sprays into both nostrils daily.   furosemide  (LASIX ) 40 MG tablet Take 1.5 tablets (60 mg total) by mouth 2 (two) times daily.   meloxicam  (MOBIC ) 15 MG tablet Take 15 mg by mouth daily as needed for pain.   metaxalone (SKELAXIN) 800 MG tablet Take 800 mg by mouth 3 (three) times daily.   naloxone (NARCAN) nasal spray 4 mg/0.1 mL    omeprazole  (PRILOSEC) 40 MG capsule Take 1 capsule (40 mg total) by mouth daily.   PERCOCET 5-325 MG tablet Take 1 tablet by mouth 4 (four) times daily as needed for moderate pain.   pramipexole  (MIRAPEX ) 0.5 MG tablet Take 1 tablet (0.5 mg total) by mouth 3 (three) times daily.   predniSONE  (STERAPRED UNI-PAK 21 TAB) 10 MG (21) TBPK tablet Use as directed   triamcinolone  (NASACORT ) 55 MCG/ACT AERO nasal inhaler PLACE 2 SPRAYS INTO THE NOSE DAILY.   Vaginal Lubricant (REPLENS) GEL Place 1 Application vaginally daily as needed (moisture/irritation).   No facility-administered encounter medications on file as of 03/08/2023.    Past Surgical History:  Procedure Laterality Date   BRAIN SURGERY     trig neuralgia   BREAST BIOPSY Right 11/07/2020   fibroadenoma   CHOLECYSTECTOMY      Family History  Problem Relation Age of Onset   Hypertension Mother    Diabetes Mother    Arthritis Mother    Lung cancer Maternal Grandfather       Controlled substance contract: n/a     Review of Systems  Constitutional:  Negative for diaphoresis.  Eyes:  Negative for pain.  Respiratory:  Negative for shortness of breath.   Cardiovascular:  Negative for chest pain, palpitations and leg swelling.  Gastrointestinal:  Negative for abdominal pain.  Endocrine: Negative for polydipsia.  Skin:  Negative for  rash.  Neurological:  Negative for dizziness, weakness and headaches.  Hematological:  Does not bruise/bleed easily.  All other systems reviewed and are negative.      Objective:   Physical Exam Vitals and nursing note reviewed.  Constitutional:      General: She is not in acute distress.    Appearance: Normal appearance. She is well-developed.  HENT:  Head: Normocephalic.     Right Ear: Tympanic membrane normal.     Left Ear: Tympanic membrane normal.     Nose: Nose normal.     Mouth/Throat:     Mouth: Mucous membranes are moist.  Eyes:     Pupils: Pupils are equal, round, and reactive to light.  Neck:     Vascular: No carotid bruit or JVD.  Cardiovascular:     Rate and Rhythm: Normal rate and regular rhythm.     Heart sounds: Normal heart sounds.  Pulmonary:     Effort: Pulmonary effort is normal. No respiratory distress.     Breath sounds: Normal breath sounds. No wheezing or rales.  Chest:     Chest wall: No tenderness.  Abdominal:     General: Bowel sounds are normal. There is no distension or abdominal bruit.     Palpations: Abdomen is soft. There is no hepatomegaly, splenomegaly, mass or pulsatile mass.     Tenderness: There is no abdominal tenderness.  Musculoskeletal:        General: Normal range of motion.     Cervical back: Normal range of motion and neck supple.     Right lower leg: Edema (1+) present.     Left lower leg: Edema (1+) present.  Lymphadenopathy:     Cervical: No cervical adenopathy.  Skin:    General: Skin is warm and dry.     Comments: Circulatory change to bil lower ext  Neurological:     Mental Status: She is alert and oriented to person, place, and time.     Deep Tendon Reflexes: Reflexes are normal and symmetric.  Psychiatric:        Behavior: Behavior normal.        Thought Content: Thought content normal.        Judgment: Judgment normal.     BP 107/66   Pulse 87   Temp (!) 97.4 F (36.3 C) (Temporal)   Resp 20   Ht 5'  2 (1.575 m)   Wt 196 lb (88.9 kg)   SpO2 100%   BMI 35.85 kg/m    Left foot xray- inflammation lateral side of left foot-Preliminary reading by Ronal Lunger, FNP  [facility]      Assessment & Plan:   Lylla Eifler Tenaglia comes in today with chief complaint of No chief complaint on file.   Diagnosis and orders addressed:  1. Mixed hyperlipidemia Low fat diet. - atorvastatin  (LIPITOR) 40 MG tablet; Take 1 tablet (40 mg total) by mouth daily.  Dispense: 90 tablet; Refill: 1 - fenofibrate  micronized (LOFIBRA) 134 MG capsule; TAKE 1 CAPSULE BY MOUTH DAILY BEFORE BREAKFAST.  Dispense: 90 capsule; Refill: 1 - CBC with Differential/Platelet - CMP14+EGFR - Lipid panel  2. Gastroesophageal reflux disease without esophagitis Stop achipix and start back on omeprazole  daily Avoid spicy foods Do not eat 2 hours prior to bedtime  - omeprazole  (PRILOSEC) 40 MG capsule; Take 1 capsule (40 mg total) by mouth daily.  Dispense: 90 capsule; Refill: 1  3. Trigeminal neuralgia  4. Recurrent major depressive disorder, in full remission (HCC) Stress management - buPROPion  (WELLBUTRIN  XL) 150 MG 24 hr tablet; Take 1 tablet (150 mg total) by mouth daily.  Dispense: 90 tablet; Refill: 1 - escitalopram  (LEXAPRO ) 20 MG tablet; Take 1 tablet (20 mg total) by mouth daily.  Dispense: 90 tablet; Refill: 1  5. Panic attacks - busPIRone  (BUSPAR ) 5 MG tablet; Take 1 tablet (5 mg total) by mouth 3 (three)  times daily.  Dispense: 90 tablet; Refill: 5  6. RLS (restless legs syndrome) Keeps legs warm at night - pramipexole  (MIRAPEX ) 0.5 MG tablet; Take 1 tablet (0.5 mg total) by mouth 3 (three) times daily.  Dispense: 270 tablet; Refill: 1  7. Chronic midline low back pain without sciatica Moist heat rest  8. Primary insomnia Bedtime routine Try 1/2 tablet night  9. Smoker Smoking cessation encouraged Ordered low dose CT scan  10. BMI 35.0-35.9,adult Discussed diet and exercise for person with BMI >25 Will  recheck weight in 3-6 months   11. Nasal congestion Continue nasacort  nasal spray - cetirizine  (ZYRTEC ) 10 MG tablet; Take 1 tablet (10 mg total) by mouth daily.  Dispense: 30 tablet; Refill: 11  12. Peripheral edema Elevate legs when sitting - furosemide  (LASIX ) 40 MG tablet; Take 1 tablet (40 mg total) by mouth daily.  Dispense: 90 tablet; Refill: 1  13. Left foot pain Voltaren  gel   Labs pending Health Maintenance reviewed Diet and exercise encouraged  Follow up plan: 6 months   Mary-Margaret Gladis, FNP

## 2023-03-10 ENCOUNTER — Encounter: Payer: Self-pay | Admitting: Acute Care

## 2023-03-12 NOTE — Addendum Note (Signed)
 Addended by: Bennie Pierini on: 03/12/2023 07:48 AM   Modules accepted: Orders

## 2023-03-13 ENCOUNTER — Telehealth: Payer: Self-pay

## 2023-03-13 NOTE — Telephone Encounter (Signed)
 Copied from CRM [id number]. Topic: Clinical - Medical Advice >> Mar 13, 2023 11:28 AM Lizabeth Riggs wrote: Reason for CRM: Tycie came in Monday and saw Dr. Gaylyn Keas. She is still not doing good. Congestion, cough, no fever, chills every now and then. She wants to see if Dr. Gaylyn Keas will call her in a new medications. She also needs cough syrup.  Please call back at [phone]

## 2023-03-14 ENCOUNTER — Ambulatory Visit: Payer: 59 | Admitting: Family

## 2023-03-14 ENCOUNTER — Encounter: Payer: Self-pay | Admitting: Family

## 2023-03-14 ENCOUNTER — Telehealth: Payer: Self-pay | Admitting: Family Medicine

## 2023-03-14 ENCOUNTER — Ambulatory Visit: Payer: Self-pay | Admitting: Nurse Practitioner

## 2023-03-14 VITALS — BP 124/78 | HR 76 | Temp 97.9°F | Wt 191.0 lb

## 2023-03-14 DIAGNOSIS — J441 Chronic obstructive pulmonary disease with (acute) exacerbation: Secondary | ICD-10-CM

## 2023-03-14 DIAGNOSIS — B370 Candidal stomatitis: Secondary | ICD-10-CM | POA: Diagnosis not present

## 2023-03-14 MED ORDER — PREDNISONE 10 MG (21) PO TBPK: ORAL_TABLET | ORAL | 0 refills | Status: DC

## 2023-03-14 MED ORDER — METHYLPREDNISOLONE ACETATE 80 MG/ML IJ SUSP
80.0000 mg | Freq: Once | INTRAMUSCULAR | Status: AC
  Administered 2023-03-14: 80 mg via INTRAMUSCULAR

## 2023-03-14 MED ORDER — PROMETHAZINE-DM 6.25-15 MG/5ML PO SYRP: 5.0000 mL | ORAL_SOLUTION | Freq: Four times a day (QID) | ORAL | 0 refills | Status: DC | PRN

## 2023-03-14 MED ORDER — MAGIC MOUTHWASH W/LIDOCAINE: 10.0000 mL | Freq: Three times a day (TID) | ORAL | 0 refills | Status: DC | PRN

## 2023-03-14 MED ORDER — AZITHROMYCIN 250 MG PO TABS: ORAL_TABLET | ORAL | 0 refills | Status: DC

## 2023-03-14 NOTE — Telephone Encounter (Signed)
  Chief Complaint: chest tightness with activity Symptoms: cough, URI s/s,  Frequency: at least a week Pertinent Negatives: Patient denies CP-states difficulty breathing with activity, denies high grade fever,  Disposition: [] ED /[] Urgent Care (no appt availability in office) / [x] Appointment(In office/virtual)/ []  [facility]/ [] Home Care/ [] Refused Recommended Disposition /[] [facility]/ []  Follow-up with PCP  Additional Notes: Pt recently seen and given abx and steroids. Pt taking them as prescribed but feels the chest congestion is not getting better. Pt states that she gets SOB easily when doing ADLs. Pt states that her fever isn't high but that she feels like she is having one. Pt unsure of what she was recently dx with, thinks something with her bronchials. Pt sched today as triager felt pt would benefit from seeing provider.   Copied from CRM [id number]. Topic: Clinical - Red Word Triage >> Mar 14, 2023 10:18 AM Elle L wrote: Red Word that prompted transfer to Nurse Triage: The patient requested to speak to a nurse as her chest hurts and she has a cough that will not go away and congestion. Reason for Disposition  [1] Chest pain(s) lasting a few seconds AND [2] persists > 3 days  Answer Assessment - Initial Assessment Questions 1. LOCATION: "Where does it hurt?"       front 2. RADIATION: "Does the pain go anywhere else?" (e.g., into neck, jaw, arms, back)     Most time it stays in the front 3. ONSET: "When did the chest pain begin?" (Minutes, hours or days)      Atleast a week 4. PATTERN: "Does the pain come and go, or has it been constant since it started?"  "Does it get worse with exertion?"      Comes and goes, worse with movement, I'm SOB with movement 7. CARDIAC RISK FACTORS: "Do you have any history of heart problems or risk factors for heart disease?" (e.g., angina, prior heart attack; diabetes, high blood pressure, high cholesterol, smoker, or strong  family history of heart disease)     denies 8. PULMONARY RISK FACTORS: "Do you have any history of lung disease?"  (e.g., blood clots in lung, asthma, emphysema, birth control pills)     Denies  9. CAUSE: "What do you think is causing the chest pain?"     By the URI that I have 10. OTHER SYMPTOMS: "Do you have any other symptoms?" (e.g., dizziness, nausea, vomiting, sweating, fever, difficulty breathing, cough)       States that this is mostly SOB when moving.  Protocols used: Chest Pain-A-AH

## 2023-03-14 NOTE — Addendum Note (Signed)
Addended by: Bennie Pierini on: 03/14/2023 09:08 AM   Modules accepted: Orders

## 2023-03-14 NOTE — Patient Instructions (Signed)
 Chronic Obstructive Pulmonary Disease Exacerbation  Chronic obstructive pulmonary disease (COPD) is a long-term (chronic) lung problem. When you have COPD, it can feel harder to breathe in or out. COPD exacerbation is a flare-up of symptoms when breathing gets worse and more treatment may be needed. Without treatment, flare-ups can be life-threatening. If they happen often, your lungs can become more damaged. What are the causes? Not taking your usual COPD medicines as told by your health care provider. A cold or the flu, which can cause infection in your lungs. Being exposed to things that make your breathing worse, such as: Smoke. Air pollution. Fumes. Dust. Allergies. Weather changes. What are the signs or symptoms? Symptoms do not get better or get worse even if you take your medicines as told by your provider. Symptoms may include: More shortness of breath. You may only be able to speak one or two words at a time. More coughing or mucus from your lungs. More wheezing or chest tightness. Being more tired and having less energy. Confusion. How is this diagnosed? This condition is diagnosed based on: Symptoms that get worse. Your medical history. A physical exam. You may also have tests, including: A chest X-ray. Blood or mucus tests. How is this treated? You may be able to stay home or you may need to go to the hospital. Treatment may include: Taking medicines. These may include: Inhalers. These have medicines in them that you breathe in. These may be more of what you already take or they may be new. Steroids. These reduce inflammation in the airways. These may be inhaled, taken by mouth, or given in an IV. Antibiotics. These treat infection. Using oxygen. Using a device to help you clear mucus. Follow these instructions at home: Medicines Take your medicines only as told by your provider. If you were given antibiotics or steroids, take them as told by your provider. Do  not stop taking them even if you start to feel better. Lifestyle Several times a day, wash your hands with soap and water for at least 20 seconds. If you cannot use soap and water, use hand sanitizer. This may help keep you from getting an infection. Avoid being around crowds or people who are sick. Do not smoke or use any products that contain nicotine or tobacco. If you need help quitting, ask your provider. Return to your normal activities when your provider says that it's safe. Use breathing methods to control your stress and catch your breath. How is this prevented? Follow your COPD action plan. The action plan tells you what to do if you're feeling good and what to do when you start feeling worse. Discuss the plan often with your provider. Make sure you get all the shots, also called vaccines, that your provider recommends. Ask your provider about a flu shot and a pneumonia shot. Use oxygen therapy if told by your provider. If you need home oxygen therapy, ask your provider how often to check your oxygen level with a device called an oximeter. Keep all follow-up visits to review your COPD action plan. Your provider will want to check on your condition often to keep you healthy and out of the hospital. Contact a health care provider if: Your COPD symptoms get worse. You have a fever or chills. You have trouble doing daily activities. You have trouble breathing even when you are resting. Get help right away if: You are short of breath and cannot: Talk in full sentences. Do normal activities. You have chest  pain. You feel confused. These symptoms may be an emergency. Call 911 right away. Do not wait to see if the symptoms will go away. Do not drive yourself to the hospital. This information is not intended to replace advice given to you by your health care provider. Make sure you discuss any questions you have with your health care provider. Document Revised: 04/30/2022 Document  Reviewed: 04/30/2022 Elsevier Patient Education  2024 [employer].

## 2023-03-14 NOTE — Telephone Encounter (Signed)
Copied from CRM [id number]. Topic: Clinical - Medication Question >> Mar 14, 2023  8:10 AM Stacie Solis H wrote: Reason for CRM: Patient is still experiencing cough and has finished all the medicine that was prescribed on 1/10

## 2023-03-14 NOTE — Telephone Encounter (Signed)
Duplicate message. Please see other telephone encounter.

## 2023-03-14 NOTE — Progress Notes (Signed)
Subjective:    Patient ID: HADARAH TOMES, female    DOB: 04-Dec-1960, 63 y.o.   MRN: [id number]  Chief Complaint  Patient presents with   Cough   COPD   Pt presents to the office today with a cough that started two days ago.  Cough This is a new problem. The current episode started in the past 7 days. The problem has been gradually worsening. The cough is Productive of sputum. Associated symptoms include chills, ear congestion, ear pain, headaches, myalgias, a sore throat, shortness of breath and wheezing. Pertinent negatives include no fever. Risk factors for lung disease include smoking/tobacco exposure. She has tried rest and OTC inhaler for the symptoms. Her past medical history is significant for COPD.  COPD She complains of cough, shortness of breath and wheezing. Associated symptoms include ear congestion, ear pain, headaches, myalgias and a sore throat. Pertinent negatives include no fever. Her past medical history is significant for COPD.  Nicotine Dependence Presents for follow-up visit. Symptoms include sore throat. Her urge triggers include company of smokers. She smokes 1 pack of cigarettes per day.      Review of Systems  Constitutional:  Positive for chills. Negative for fever.  HENT:  Positive for ear pain and sore throat.   Respiratory:  Positive for cough, shortness of breath and wheezing.   Musculoskeletal:  Positive for myalgias.  Neurological:  Positive for headaches.  All other systems reviewed and are negative.      Objective:   Physical Exam Vitals reviewed.  Constitutional:      General: She is not in acute distress.    Appearance: She is well-developed. She is obese.  HENT:     Head: Normocephalic and atraumatic.     Right Ear: Tympanic membrane normal.     Left Ear: Tympanic membrane normal.  Eyes:     Pupils: Pupils are equal, round, and reactive to light.  Neck:     Thyroid: No thyromegaly.  Cardiovascular:     Rate and Rhythm: Normal rate and  regular rhythm.     Heart sounds: Normal heart sounds. No murmur heard. Pulmonary:     Effort: Pulmonary effort is normal. No respiratory distress.     Breath sounds: Wheezing and rhonchi present.  Abdominal:     General: Bowel sounds are normal. There is no distension.     Palpations: Abdomen is soft.     Tenderness: There is no abdominal tenderness.  Musculoskeletal:        General: No tenderness. Normal range of motion.     Cervical back: Normal range of motion and neck supple.  Skin:    General: Skin is warm and dry.  Neurological:     Mental Status: She is alert and oriented to person, place, and time.     Cranial Nerves: No cranial nerve deficit.     Deep Tendon Reflexes: Reflexes are normal and symmetric.  Psychiatric:        Behavior: Behavior normal.        Thought Content: Thought content normal.        Judgment: Judgment normal.     BP 124/78   Pulse 76   Temp 97.9 F (36.6 C) (Temporal)   Wt 191 lb (86.6 kg)   SpO2 94%   BMI 34.93 kg/m       Assessment & Plan:  DAILAH VINLUAN comes in today with chief complaint of Cough and COPD   Diagnosis and orders addressed:  1. COPD exacerbation (HCC) (Primary) Start prednisone tomorrow - Take meds as prescribed - Use a cool mist humidifier  -Use saline nose sprays frequently -Force fluids -For any cough or congestion  Use plain Mucinex- regular strength or max strength is fine -For fever or aces or pains- take tylenol or ibuprofen. -Throat lozenges if help -Follow up if symptoms worsen or do not improve  - methylPREDNISolone acetate (DEPO-MEDROL) injection 80 mg - predniSONE (STERAPRED UNI-PAK 21 TAB) 10 MG (21) TBPK tablet; Use as directed  Dispense: 21 tablet; Refill: 0  2. Oral thrush - magic mouthwash w/lidocaine SOLN; Take 10 mLs by mouth 3 (three) times daily as needed for mouth pain.  Dispense: 250 mL; Refill: 0   Jannifer Rodney, FNP

## 2023-03-25 DIAGNOSIS — M79672 Pain in left foot: Secondary | ICD-10-CM | POA: Diagnosis not present

## 2023-03-30 ENCOUNTER — Other Ambulatory Visit: Payer: Self-pay | Admitting: Nurse Practitioner

## 2023-03-30 DIAGNOSIS — F410 Panic disorder [episodic paroxysmal anxiety] without agoraphobia: Secondary | ICD-10-CM

## 2023-05-02 ENCOUNTER — Other Ambulatory Visit: Payer: Self-pay | Admitting: Family Medicine

## 2023-05-02 DIAGNOSIS — R062 Wheezing: Secondary | ICD-10-CM

## 2023-05-06 DIAGNOSIS — M7672 Peroneal tendinitis, left leg: Secondary | ICD-10-CM | POA: Diagnosis not present

## 2023-05-10 DIAGNOSIS — M79672 Pain in left foot: Secondary | ICD-10-CM | POA: Diagnosis not present

## 2023-05-15 DIAGNOSIS — I5030 Unspecified diastolic (congestive) heart failure: Secondary | ICD-10-CM | POA: Diagnosis not present

## 2023-05-15 DIAGNOSIS — G8929 Other chronic pain: Secondary | ICD-10-CM | POA: Diagnosis not present

## 2023-05-15 DIAGNOSIS — R8271 Bacteriuria: Secondary | ICD-10-CM | POA: Diagnosis not present

## 2023-05-15 DIAGNOSIS — N1832 Chronic kidney disease, stage 3b: Secondary | ICD-10-CM | POA: Diagnosis not present

## 2023-05-15 DIAGNOSIS — Z720 Tobacco use: Secondary | ICD-10-CM | POA: Diagnosis not present

## 2023-05-20 ENCOUNTER — Other Ambulatory Visit: Payer: Self-pay | Admitting: Nurse Practitioner

## 2023-05-20 DIAGNOSIS — M79672 Pain in left foot: Secondary | ICD-10-CM

## 2023-06-04 ENCOUNTER — Ambulatory Visit: Attending: Orthopedic Surgery

## 2023-06-04 DIAGNOSIS — M79672 Pain in left foot: Secondary | ICD-10-CM | POA: Diagnosis not present

## 2023-06-04 DIAGNOSIS — M7672 Peroneal tendinitis, left leg: Secondary | ICD-10-CM | POA: Diagnosis not present

## 2023-06-04 NOTE — Addendum Note (Signed)
 Addended by: Granville Lewis on: 06/04/2023 06:57 PM   Modules accepted: Orders

## 2023-06-04 NOTE — Therapy (Addendum)
 OUTPATIENT PHYSICAL THERAPY LOWER EXTREMITY EVALUATION   Patient Name: Stacie Solis MRN: [id number] DOB:05-20-1960, 63 y.o., female Today's Date: 06/04/2023  END OF SESSION:  PT End of Session - 06/04/23 1115     Visit Number 1    Number of Visits 4    Date for PT Re-Evaluation 07/05/23    PT Start Time 1015    PT Stop Time 1100    PT Time Calculation (min) 45 min    Behavior During Therapy [facility] for tasks assessed/performed             Past Medical History:  Diagnosis Date   Allergy    Anxiety    Arthritis    Chronic back pain    Depression    GERD (gastroesophageal reflux disease)    Hyperlipidemia    Past Surgical History:  Procedure Laterality Date   BRAIN SURGERY     trig neuralgia   BREAST BIOPSY Right 11/07/2020   fibroadenoma   CHOLECYSTECTOMY     Patient Active Problem List   Diagnosis Date Noted   Congestive heart failure (CHF) (HCC) 11/28/2022   Chronic kidney disease (CKD), stage III (moderate) (HCC) 11/28/2022   Peripheral edema 09/03/2022   Chronic lower back pain 06/08/2014   Smoker 03/03/2014   BMI 35.0-35.9,adult 03/03/2014   RLS (restless legs syndrome) 03/03/2014   Insomnia 03/03/2014   Depression 01/05/2013   GERD (gastroesophageal reflux disease) 09/11/2012   Hyperlipidemia 09/11/2012   Panic attacks 09/11/2012   Trigeminal neuralgia 09/11/2012    PCP: Bennie Pierini, FNP  REFERRING PROVIDER: Toni Arthurs, MD  REFERRING DIAG: [phone] (ICD-10-CM) - Peroneal tendinitis, left leg  THERAPY DIAG:  Pain in left foot  Peroneal tendinitis of lower leg, left  Rationale for Evaluation and Treatment: Rehabilitation  ONSET DATE: 3 months ago   SUBJECTIVE:   SUBJECTIVE STATEMENT: Patient reported left lateral foot and ankle pain that began 3 months ago with no know cause. Stated that pain is 9.5/10 at its worst and 3.5/10 at the time of initial evaluation. Pain limits her ability to perform ADL's. She stated her pain is most  aggravated while walking.   PERTINENT HISTORY: Depression, panic attacks, smoker, CKD, chronic low back pain, CHF, peripheral edema PAIN:  Are you having pain? Yes: NPRS scale: 9.5/10 at its worst, 3.5 at time of treatment Pain location: Left lateral foot and ankle  Pain description: Throbbing, Sharp, Ache Aggravating factors: Walking Relieving factors: Rest, non-weightbearing positioning  PRECAUTIONS: Fall  RED FLAGS: None   WEIGHT BEARING RESTRICTIONS: No  FALLS:  Has patient fallen in last 6 months? No  LIVING ENVIRONMENT: Lives with: lives with their spouse Lives in: House/apartment Stairs:  Yes: Internal: Attic 18 steps left handrail going up, Basement 16 steps right handrail going down   Has following equipment at home: None  OCCUPATION: Retired   PLOF: Independent  PATIENT GOALS: work in her garden, go on walks, decrease pain, less difficulty with work activities, improve movement, improve strength.  NEXT MD VISIT: 6 months   OBJECTIVE:  Note: Objective measures were completed at Evaluation unless otherwise noted.  DIAGNOSTIC FINDINGS:   X-Ray 03/11/2023 22:49: There is 5 mm degenerative spurring at the lateral base of the fifth metatarsal at the peroneus brevis tendon insertion, chronic enthesopathic change. There is some curvilinear lucency at the base of this spur at the proximal lateral aspect of the fifth metatarsal which could represent an acute to subacute fracture. Recommend clinical correlation for point tenderness.  PATIENT  SURVEYS:  LEFS 17/80  COGNITION: Overall cognitive status:  WFL      SENSATION: Light touch: WFL  EDEMA:   Observed bilateral edema on the dorsal aspect of both feet and left 5th MT tuberosity.   PALPATION: TTP at left fibular head, 5th MT tuberosity and over the peroneal tendons along the dorsal aspect of the left lateral malleolus.   LOWER EXTREMITY ROM:  Active ROM Right eval Left eval  Hip flexion    Hip extension     Hip abduction    Hip adduction    Hip internal rotation    Hip external rotation    Knee flexion    Knee extension    Ankle dorsiflexion 20 9  Ankle plantarflexion 40 32 discomfort  Ankle inversion 20 11 felt a stretch  Ankle eversion 10 7 pain    (Blank rows = not tested)  LOWER EXTREMITY MMT:  MMT Right eval Left eval  Hip flexion    Hip extension    Hip abduction    Hip adduction    Hip internal rotation    Hip external rotation    Knee flexion    Knee extension    Ankle dorsiflexion 4 3+ with pain  Ankle plantarflexion    Ankle inversion 4 4  Ankle eversion 4 3+ with pain   (Blank rows = not tested)  GAIT: Assistive device utilized: None Level of assistance: Complete Independence   PATIENT EDUCATION:  Education details: Patient educated on objective findings, POC, prognosis and goals for therapy. Person educated: Patient Education method: Explanation Education comprehension: verbalized understanding  HOME EXERCISE PROGRAM:   ASSESSMENT:  CLINICAL IMPRESSION: Patient is a 63 y.o. woman who was seen today for physical therapy evaluation and treatment for left foot pain. Objective findings from evaluation of left lower leg and foot include TTP of fibular head, 5th MT tuberosity and peroneal tendons along dorsal aspect of left lateral malleolus, painful and limited ankle ROM, painful and weak eversion and inversion. Patient would benefit from skilled PT to address impairments to be able to walk and work in her garden again.   OBJECTIVE IMPAIRMENTS: decreased activity tolerance, decreased balance, decreased endurance, decreased mobility, difficulty walking, decreased ROM, decreased strength, decreased safety awareness, increased edema, impaired flexibility, and pain.   ACTIVITY LIMITATIONS: carrying, lifting, bending, standing, squatting, sleeping, stairs, transfers, bed mobility, and dressing  PARTICIPATION LIMITATIONS: meal prep, cleaning, laundry, community  activity, and yard work  PERSONAL FACTORS: Time since onset of injury/illness/exacerbation and 3+ comorbidities: Depression, panic attacks, smoker, chronic low back pain, CHF, peripheral edema   are also affecting patient's functional outcome.   REHAB POTENTIAL: Good  CLINICAL DECISION MAKING: Evolving/moderate complexity  EVALUATION COMPLEXITY: Moderate   GOALS: Goals reviewed with patient? No  LONG TERM GOALS: Target date: 07/05/2023  Patient will be independent with her HEP.  Baseline:  Goal status: INITIAL  2.  Patient's LEFS score will increase from 17 to 29 to ensure greater confidence in participating in community activities.  Baseline:  Goal status: INITIAL  3.  Patient's pain at its worse will decrease from 9.5 to 7.5 which will improve ability to walk for longer periods of time.  Baseline:  Goal status: INITIAL  4.  Patient's eversion MMT will increase to 4 which will enable her to increase her strength to improve function participating in gardening activities in her yard.  Baseline:  Goal status: INITIAL   PLAN:  PT FREQUENCY: 1x/week  PT DURATION: 4 weeks  PLANNED  INTERVENTIONS: 97164- PT Re-evaluation, 97110-Therapeutic exercises, 97530- Therapeutic activity, [id number]- Neuromuscular re-education, [phone]- Self Care, 57322- Manual therapy, [phone]- Gait training, [phone]- Electrical stimulation (unattended), 97016- Vasopneumatic device, Patient/Family education, Balance training, Stair training, Joint mobilization, Cryotherapy, and Moist heat  PLAN FOR NEXT SESSION: Recumbent bike, lower leg muscle isometrics, pain free AROM, modalities as needed.    Ermal Haberer, Student-PT 06/04/2023, 6:24 PM

## 2023-06-12 ENCOUNTER — Encounter: Payer: Self-pay | Admitting: Physical Therapy

## 2023-06-12 ENCOUNTER — Ambulatory Visit: Admitting: Physical Therapy

## 2023-06-12 DIAGNOSIS — M7672 Peroneal tendinitis, left leg: Secondary | ICD-10-CM | POA: Diagnosis not present

## 2023-06-12 DIAGNOSIS — M79672 Pain in left foot: Secondary | ICD-10-CM

## 2023-06-12 NOTE — Therapy (Signed)
 OUTPATIENT PHYSICAL THERAPY LOWER EXTREMITY TREATMENT   Patient Name: Stacie Solis MRN: [id number] DOB:23-Sep-1960, 63 y.o., female Today's Date: 06/12/2023  END OF SESSION:  PT End of Session - 06/12/23 1204     Visit Number 2    Number of Visits 4    Date for PT Re-Evaluation 07/05/23    PT Start Time 1105    PT Stop Time 1156    PT Time Calculation (min) 51 min    Activity Tolerance Patient tolerated treatment well    Behavior During Therapy WFL for tasks assessed/performed              Past Medical History:  Diagnosis Date   Allergy    Anxiety    Arthritis    Chronic back pain    Depression    GERD (gastroesophageal reflux disease)    Hyperlipidemia    Past Surgical History:  Procedure Laterality Date   BRAIN SURGERY     trig neuralgia   BREAST BIOPSY Right 11/07/2020   fibroadenoma   CHOLECYSTECTOMY     Patient Active Problem List   Diagnosis Date Noted   Congestive heart failure (CHF) (HCC) 11/28/2022   Chronic kidney disease (CKD), stage III (moderate) (HCC) 11/28/2022   Peripheral edema 09/03/2022   Chronic lower back pain 06/08/2014   Smoker 03/03/2014   BMI 35.0-35.9,adult 03/03/2014   RLS (restless legs syndrome) 03/03/2014   Insomnia 03/03/2014   Depression 01/05/2013   GERD (gastroesophageal reflux disease) 09/11/2012   Hyperlipidemia 09/11/2012   Panic attacks 09/11/2012   Trigeminal neuralgia 09/11/2012    PCP: Delfina Feller, FNP  REFERRING PROVIDER: Amada Backer, MD  REFERRING DIAG: [phone] (ICD-10-CM) - Peroneal tendinitis, left leg  THERAPY DIAG:  Pain in left foot  Peroneal tendinitis of lower leg, left  Rationale for Evaluation and Treatment: Rehabilitation  ONSET DATE: 3 months ago   SUBJECTIVE:   SUBJECTIVE STATEMENT: No new complaints.    PERTINENT HISTORY: Depression, panic attacks, smoker, CKD, chronic low back pain, CHF, peripheral edema PAIN:  Are you having pain? Yes: NPRS scale: 9.5/10 at its worst,  3.5 at time of treatment Pain location: Left lateral foot and ankle  Pain description: Throbbing, Sharp, Ache Aggravating factors: Walking Relieving factors: Rest, non-weightbearing positioning  PRECAUTIONS: Fall  RED FLAGS: None   WEIGHT BEARING RESTRICTIONS: No  FALLS:  Has patient fallen in last 6 months? No  LIVING ENVIRONMENT: Lives with: lives with their spouse Lives in: House/apartment Stairs:  Yes: Internal: Attic 18 steps left handrail going up, Basement 16 steps right handrail going down   Has following equipment at home: None  OCCUPATION: Retired   PLOF: Independent  PATIENT GOALS: work in her garden, go on walks, decrease pain, less difficulty with work activities, improve movement, improve strength.  NEXT MD VISIT: 6 months   OBJECTIVE:  Note: Objective measures were completed at Evaluation unless otherwise noted.  DIAGNOSTIC FINDINGS:   X-Ray 03/11/2023 22:49: There is 5 mm degenerative spurring at the lateral base of the fifth metatarsal at the peroneus brevis tendon insertion, chronic enthesopathic change. There is some curvilinear lucency at the base of this spur at the proximal lateral aspect of the fifth metatarsal which could represent an acute to subacute fracture. Recommend clinical correlation for point tenderness.  PATIENT SURVEYS:  LEFS 17/80  COGNITION: Overall cognitive status:  WFL      SENSATION: Light touch: WFL  EDEMA:   Observed bilateral edema on the dorsal aspect of both feet and  left 5th MT tuberosity.   PALPATION: TTP at left fibular head, 5th MT tuberosity and over the peroneal tendons along the dorsal aspect of the left lateral malleolus.   LOWER EXTREMITY ROM:  Active ROM Right eval Left eval  Hip flexion    Hip extension    Hip abduction    Hip adduction    Hip internal rotation    Hip external rotation    Knee flexion    Knee extension    Ankle dorsiflexion 20 9  Ankle plantarflexion 40 32 discomfort  Ankle  inversion 20 11 felt a stretch  Ankle eversion 10 7 pain    (Blank rows = not tested)  LOWER EXTREMITY MMT:  MMT Right eval Left eval  Hip flexion    Hip extension    Hip abduction    Hip adduction    Hip internal rotation    Hip external rotation    Knee flexion    Knee extension    Ankle dorsiflexion 4 3+ with pain  Ankle plantarflexion    Ankle inversion 4 4  Ankle eversion 4 3+ with pain   (Blank rows = not tested)  GAIT: Assistive device utilized: None Level of assistance: Complete Independence   PATIENT EDUCATION:  Education details: Patient educated on objective findings, POC, prognosis and goals for therapy. Person educated: Patient Education method: Explanation Education comprehension: verbalized understanding  HOME EXERCISE PROGRAM:   TODAY's TX:  Nustep level 3 x 6 minutes f/b combo e'stim/US (small soundhead) at 3.3 mz on 10% x 7 minutes to patient's left base of fifth MT region f/b STW/M along the distrubution of the peroneal musculature to base of fifth MT f/b LE elevation and low-level IFC at 80-150 Hz on 40% scan with vasopneumatic on low x 12 minutes.  Normal modality response following removal of modality.   ASSESSMENT:  CLINICAL IMPRESSION: The patient did very well with treatment today and stated "it feels better" following treatment.    OBJECTIVE IMPAIRMENTS: decreased activity tolerance, decreased balance, decreased endurance, decreased mobility, difficulty walking, decreased ROM, decreased strength, decreased safety awareness, increased edema, impaired flexibility, and pain.   ACTIVITY LIMITATIONS: carrying, lifting, bending, standing, squatting, sleeping, stairs, transfers, bed mobility, and dressing  PARTICIPATION LIMITATIONS: meal prep, cleaning, laundry, community activity, and yard work  PERSONAL FACTORS: Time since onset of injury/illness/exacerbation and 3+ comorbidities: Depression, panic attacks, smoker, chronic low back pain, CHF,  peripheral edema   are also affecting patient's functional outcome.   REHAB POTENTIAL: Good  CLINICAL DECISION MAKING: Evolving/moderate complexity  EVALUATION COMPLEXITY: Moderate   GOALS: Goals reviewed with patient? No  LONG TERM GOALS: Target date: 07/05/2023  Patient will be independent with her HEP.  Baseline:  Goal status: INITIAL  2.  Patient's LEFS score will increase from 17 to 29 to ensure greater confidence in participating in community activities.  Baseline:  Goal status: INITIAL  3.  Patient's pain at its worse will decrease from 9.5 to 7.5 which will improve ability to walk for longer periods of time.  Baseline:  Goal status: INITIAL  4.  Patient's eversion MMT will increase to 4 which will enable her to increase her strength to improve function participating in gardening activities in her yard.  Baseline:  Goal status: INITIAL   PLAN:  PT FREQUENCY: 1x/week  PT DURATION: 4 weeks  PLANNED INTERVENTIONS: 97164- PT Re-evaluation, 97110-Therapeutic exercises, 97530- Therapeutic activity, [id number]- Neuromuscular re-education, 97535- Self Care, 16109- Manual therapy, [id number]- Gait training, [phone]- Electrical stimulation (unattended),  16109- Vasopneumatic device, Patient/Family education, Balance training, Stair training, Joint mobilization, Cryotherapy, and Moist heat  PLAN FOR NEXT SESSION: Recumbent bike, lower leg muscle isometrics, pain free AROM, modalities as needed.    Eloise Picone, [place], PT 06/12/2023, 12:12 PM

## 2023-06-13 DIAGNOSIS — M51362 Other intervertebral disc degeneration, lumbar region with discogenic back pain and lower extremity pain: Secondary | ICD-10-CM | POA: Diagnosis not present

## 2023-06-13 DIAGNOSIS — G894 Chronic pain syndrome: Secondary | ICD-10-CM | POA: Diagnosis not present

## 2023-06-13 DIAGNOSIS — M79605 Pain in left leg: Secondary | ICD-10-CM | POA: Diagnosis not present

## 2023-06-13 DIAGNOSIS — M5416 Radiculopathy, lumbar region: Secondary | ICD-10-CM | POA: Diagnosis not present

## 2023-06-13 DIAGNOSIS — Z79891 Long term (current) use of opiate analgesic: Secondary | ICD-10-CM | POA: Diagnosis not present

## 2023-06-20 ENCOUNTER — Encounter

## 2023-06-25 ENCOUNTER — Ambulatory Visit

## 2023-06-25 DIAGNOSIS — M79672 Pain in left foot: Secondary | ICD-10-CM

## 2023-06-25 DIAGNOSIS — M7672 Peroneal tendinitis, left leg: Secondary | ICD-10-CM | POA: Diagnosis not present

## 2023-06-25 NOTE — Therapy (Signed)
 OUTPATIENT PHYSICAL THERAPY LOWER EXTREMITY TREATMENT   Patient Name: Stacie Solis MRN: [id number] DOB:May 15, 1960, 63 y.o., female Today's Date: 06/25/2023  END OF SESSION:  PT End of Session - 06/25/23 1025     Visit Number 3    Number of Visits 4    Date for PT Re-Evaluation 07/05/23    PT Start Time 1024    PT Stop Time 1109    PT Time Calculation (min) 45 min    Activity Tolerance Patient tolerated treatment well    Behavior During Therapy [facility] for tasks assessed/performed              Past Medical History:  Diagnosis Date   Allergy    Anxiety    Arthritis    Chronic back pain    Depression    GERD (gastroesophageal reflux disease)    Hyperlipidemia    Past Surgical History:  Procedure Laterality Date   BRAIN SURGERY     trig neuralgia   BREAST BIOPSY Right 11/07/2020   fibroadenoma   CHOLECYSTECTOMY     Patient Active Problem List   Diagnosis Date Noted   Congestive heart failure (CHF) (HCC) 11/28/2022   Chronic kidney disease (CKD), stage III (moderate) (HCC) 11/28/2022   Peripheral edema 09/03/2022   Chronic lower back pain 06/08/2014   Smoker 03/03/2014   BMI 35.0-35.9,adult 03/03/2014   RLS (restless legs syndrome) 03/03/2014   Insomnia 03/03/2014   Depression 01/05/2013   GERD (gastroesophageal reflux disease) 09/11/2012   Hyperlipidemia 09/11/2012   Panic attacks 09/11/2012   Trigeminal neuralgia 09/11/2012    PCP: Delfina Feller, FNP  REFERRING PROVIDER: Amada Backer, MD  REFERRING DIAG: [phone] (ICD-10-CM) - Peroneal tendinitis, left leg  THERAPY DIAG:  Pain in left foot  Peroneal tendinitis of lower leg, left  Rationale for Evaluation and Treatment: Rehabilitation  ONSET DATE: 3 months ago   SUBJECTIVE:   SUBJECTIVE STATEMENT:  Patient reported left foot and ankle is hurting today. She described the pain as burning and aching when she walks. Pain was a 4/10 at the start of treatment.    PERTINENT HISTORY: Depression,  panic attacks, smoker, CKD, chronic low back pain, CHF, peripheral edema  PAIN:  Are you having pain? Yes: NPRS scale: 4/10 at time of treatment Pain location: Left lateral foot and ankle  Pain description: Throbbing, Sharp, Ache Aggravating factors: Walking Relieving factors: Rest, non-weightbearing positioning  PRECAUTIONS: Fall  RED FLAGS: None   WEIGHT BEARING RESTRICTIONS: No  FALLS:  Has patient fallen in last 6 months? No  LIVING ENVIRONMENT: Lives with: lives with their spouse Lives in: House/apartment Stairs:  Yes: Internal: Attic 18 steps left handrail going up, Basement 16 steps right handrail going down   Has following equipment at home: None  OCCUPATION: Retired   PLOF: Independent  PATIENT GOALS: work in her garden, go on walks, decrease pain, less difficulty with work activities, improve movement, improve strength.  NEXT MD VISIT: 6 months   OBJECTIVE:  Note: Objective measures were completed at Evaluation unless otherwise noted.  DIAGNOSTIC FINDINGS:   X-Ray 03/11/2023 22:49: There is 5 mm degenerative spurring at the lateral base of the fifth metatarsal at the peroneus brevis tendon insertion, chronic enthesopathic change. There is some curvilinear lucency at the base of this spur at the proximal lateral aspect of the fifth metatarsal which could represent an acute to subacute fracture. Recommend clinical correlation for point tenderness.  PATIENT SURVEYS:  LEFS 17/80  COGNITION: Overall cognitive status:  [facility] [employer] [facility]  SENSATION: Light touch: WFL  EDEMA:   Observed bilateral edema on the dorsal aspect of both feet and left 5th MT tuberosity.   PALPATION: TTP at left fibular head, 5th MT tuberosity and over the peroneal tendons along the dorsal aspect of the left lateral malleolus.   LOWER EXTREMITY ROM:  Active ROM Right eval Left eval  Hip flexion    Hip extension    Hip abduction    Hip adduction    Hip internal rotation    Hip external  rotation    Knee flexion    Knee extension    Ankle dorsiflexion 20 9  Ankle plantarflexion 40 32 discomfort  Ankle inversion 20 11 felt a stretch  Ankle eversion 10 7 pain    (Blank rows = not tested)  LOWER EXTREMITY MMT:  MMT Right eval Left eval  Hip flexion    Hip extension    Hip abduction    Hip adduction    Hip internal rotation    Hip external rotation    Knee flexion    Knee extension    Ankle dorsiflexion 4 3+ with pain  Ankle plantarflexion    Ankle inversion 4 4  Ankle eversion 4 3+ with pain   (Blank rows = not tested)  GAIT: Assistive device utilized: None Level of assistance: Complete Independence   PATIENT EDUCATION:  Education details: Patient educated on objective findings, POC, prognosis and goals for therapy. Person educated: Patient Education method: Explanation Education comprehension: verbalized understanding  HOME EXERCISE PROGRAM: [id number]                                       06/25/23 EXERCISE LOG   Exercise Repetitions and Resistance Comments  Nustep  Level 5 10 minutes  TE  Toe raises   3 sets of 12 reps   NR  Ball squeezes with feet  3 sets of 12 reps  NR  Heel raises with ball squeeze   3 sets of 12 reps   NR  Ankle eversion   3 sets of 12 reps red band  NR    Blank cell = exercise not performed today   Modalities   Unattended Estim: Left ankle and foot, Premod, 15 mins, Pain Vaso: Left ankle and foot, 34 degrees, low pressure, 15 mins, Pain and Edema  Patient reported increased mobility, decrease pain, and no adverse effects upon completion of modalities. Place patient in seated position next time modalities are performed.   TODAY's TX:  Nustep level 3 x 6 minutes f/b combo e'stim/US  (small soundhead) at 3.3 mz on 10% x 7 minutes to patient's left base of fifth MT region f/b STW/M along the distrubution of the peroneal musculature to base of fifth MT f/b LE elevation and low-level IFC at 80-150 Hz on 40% scan with  vasopneumatic on low x 12 minutes.  Normal modality response following removal of modality.   ASSESSMENT:  CLINICAL IMPRESSION:  The patient was 10 minutes late to treatment session today. She responded well to treatment. She was provided tactile and verbal cueing with heel raise, toe raise, eversion and inversion exercises to ensure proper muscle engagement and body mechanics. She was provided with estim and vaso modalities which decreased pain and increased mobility. She was provided an HEP and demonstrated and verbalized compliance. She reported a  0/10 pain and increased mobility upon completion of treatment session. Patient with  potential discharge at next treatment. Patient will continue to benefit from skilled PT in order address impairments, decrease pain while improving ability to perform ADL's.     OBJECTIVE IMPAIRMENTS: decreased activity tolerance, decreased balance, decreased endurance, decreased mobility, difficulty walking, decreased ROM, decreased strength, decreased safety awareness, increased edema, impaired flexibility, and pain.   ACTIVITY LIMITATIONS: carrying, lifting, bending, standing, squatting, sleeping, stairs, transfers, bed mobility, and dressing  PARTICIPATION LIMITATIONS: meal prep, cleaning, laundry, community activity, and yard work  PERSONAL FACTORS: Time since onset of injury/illness/exacerbation and 3+ comorbidities: Depression, panic attacks, smoker, chronic low back pain, CHF, peripheral edema   are also affecting patient's functional outcome.   REHAB POTENTIAL: Good  CLINICAL DECISION MAKING: Evolving/moderate complexity  EVALUATION COMPLEXITY: Moderate   GOALS: Goals reviewed with patient? No  LONG TERM GOALS: Target date: 07/05/2023  Patient will be independent with her HEP.  Baseline:  Goal status: INITIAL  2.  Patient's LEFS score will increase from 17 to 29 to ensure greater confidence in participating in community activities.  Baseline:   Goal status: INITIAL  3.  Patient's pain at its worse will decrease from 9.5 to 7.5 which will improve ability to walk for longer periods of time.  Baseline:  Goal status: INITIAL  4.  Patient's eversion MMT will increase to 4 which will enable her to increase her strength to improve function participating in gardening activities in her yard.  Baseline:  Goal status: INITIAL   PLAN:  PT FREQUENCY: 1x/week  PT DURATION: 4 weeks  PLANNED INTERVENTIONS: 97164- PT Re-evaluation, 97110-Therapeutic exercises, 97530- Therapeutic activity, [id number]- Neuromuscular re-education, 97535- Self Care, [id number]- Manual therapy, [id number]- Gait training, [phone]- Electrical stimulation (unattended), 97016- Vasopneumatic device, Patient/Family education, Balance training, Stair training, Joint mobilization, Cryotherapy, and Moist heat  PLAN FOR NEXT SESSION:  Recumbent bike, lower leg muscle strengthening, pain free AROM, modalities as needed.    Lovel Suazo, Student-PT 06/25/2023, 12:10 PM

## 2023-07-04 ENCOUNTER — Ambulatory Visit: Attending: Orthopedic Surgery

## 2023-07-10 ENCOUNTER — Other Ambulatory Visit: Payer: Self-pay | Admitting: Nurse Practitioner

## 2023-07-10 DIAGNOSIS — R062 Wheezing: Secondary | ICD-10-CM

## 2023-07-10 DIAGNOSIS — M19072 Primary osteoarthritis, left ankle and foot: Secondary | ICD-10-CM | POA: Diagnosis not present

## 2023-07-27 IMAGING — MG MM DIGITAL DIAGNOSTIC UNILAT*R*
8 of 12 series · 9 of 32 positions shown · non-contrast
Comparison: Previous exam(s).

CLINICAL DATA: The patient was called back for right breast
calcifications, possible adjacent distortion, and a separate
asymmetry in the right breast.

EXAM:
DIGITAL DIAGNOSTIC UNILATERAL RIGHT MAMMOGRAM
TECHNIQUE: Right digital diagnostic mammography was performed. Mammographic
images were processed with CAD.

[R ML]
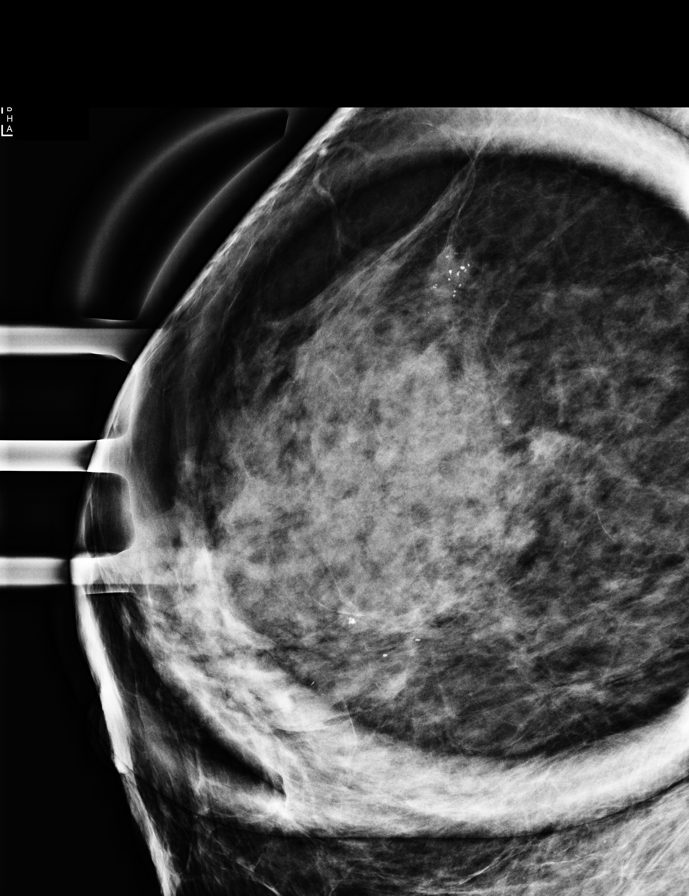

[R CC]
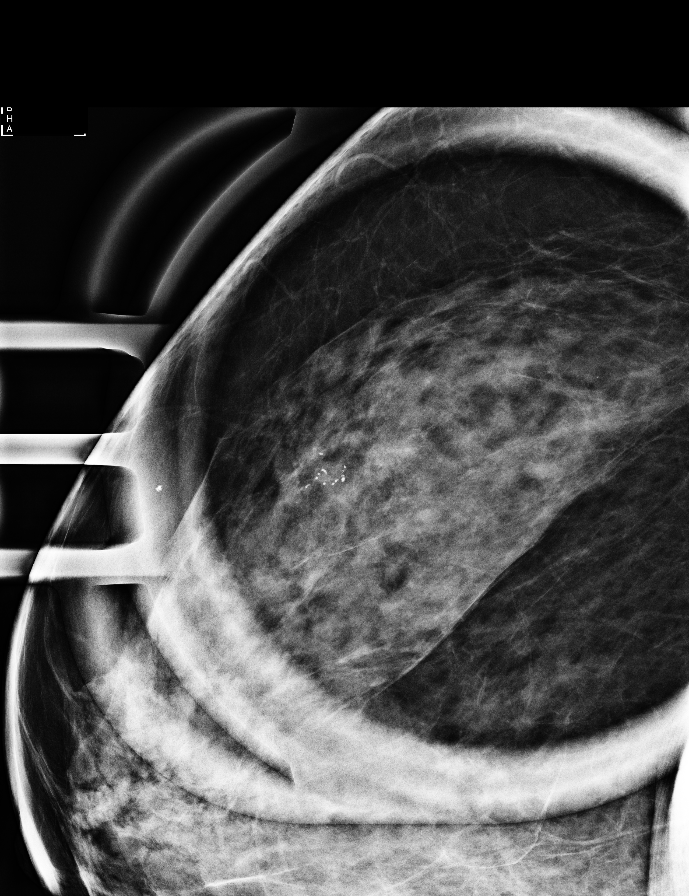

[R MLO synth-2D (1 of 3)]
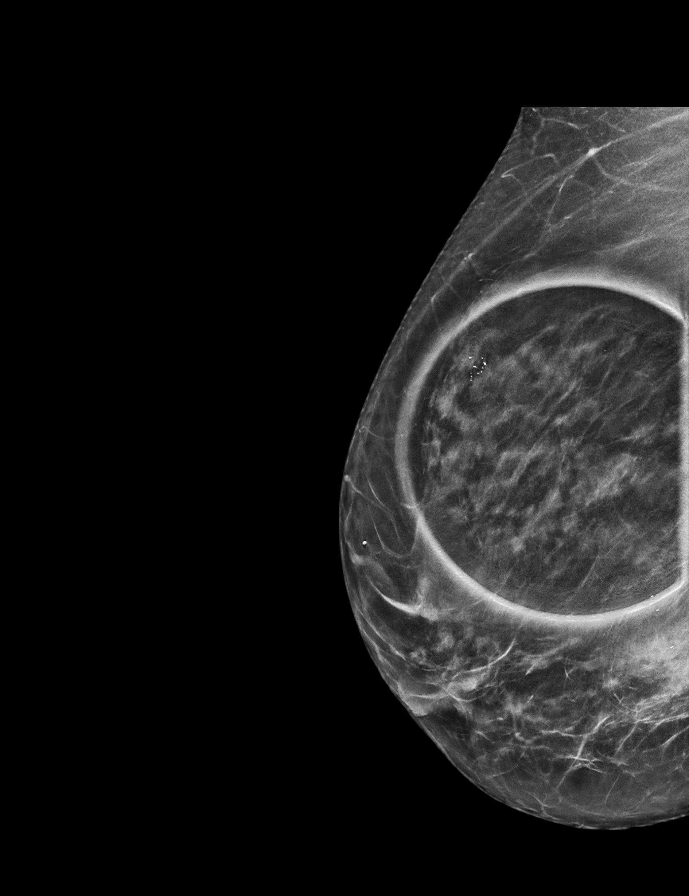

[R ML synth-2D]
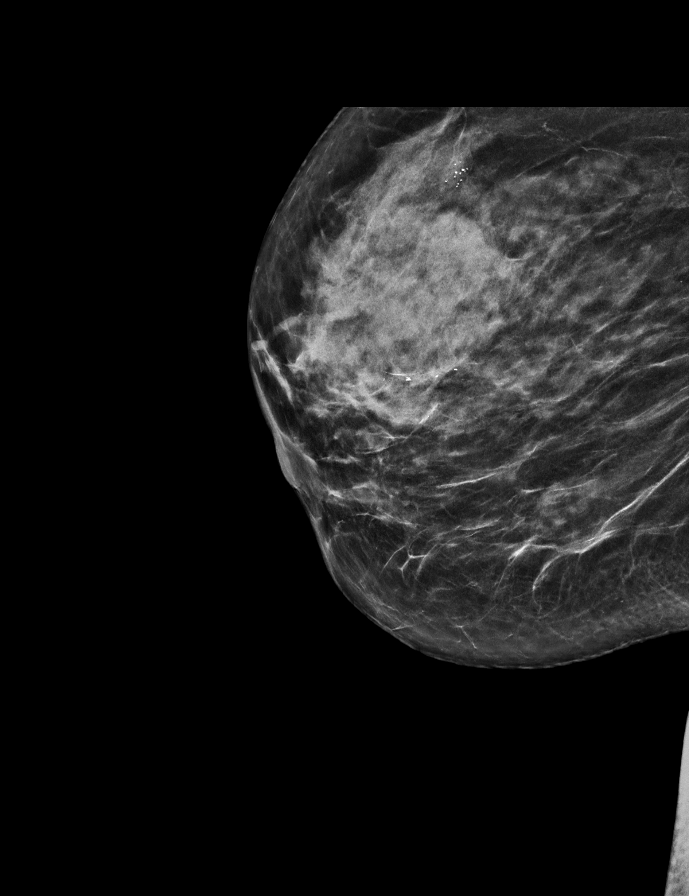

[R CC synth-2D]
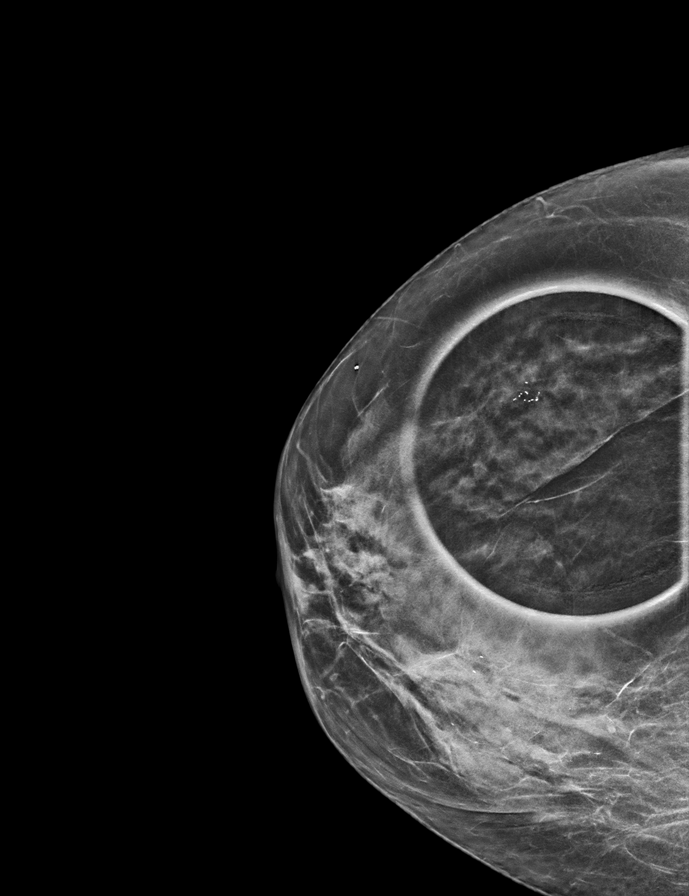

[R MLO synth-2D (2 of 3)]
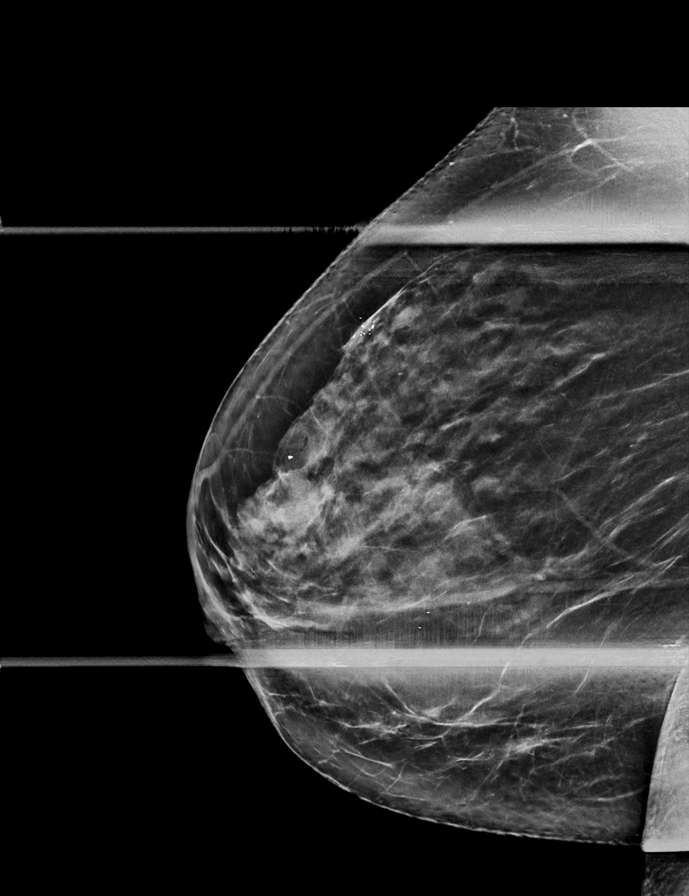

[R MLO synth-2D (3 of 3)]
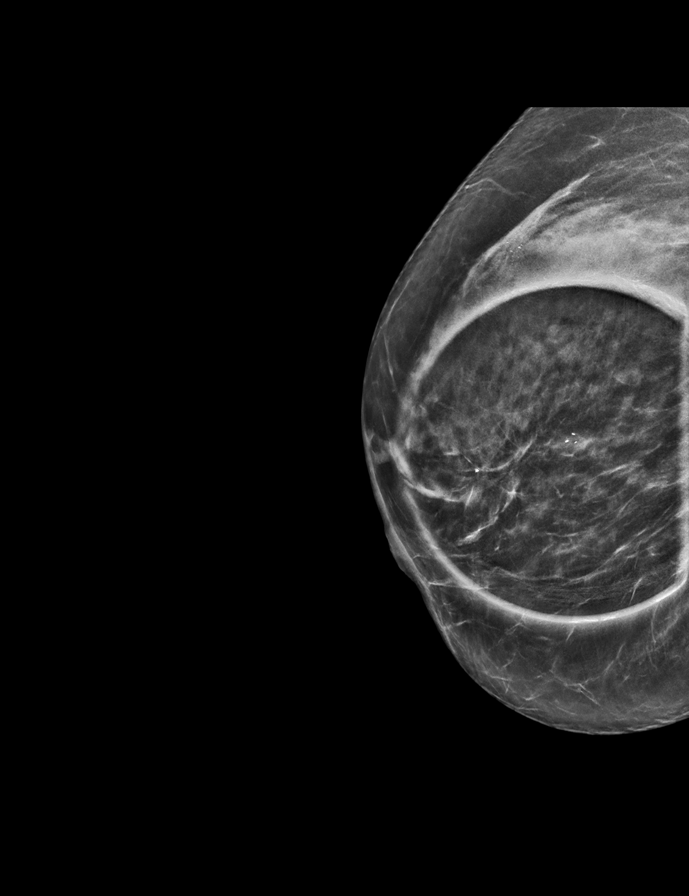

[R MLO tomo · 2 of 65 frames shown]
[frame 21/65]
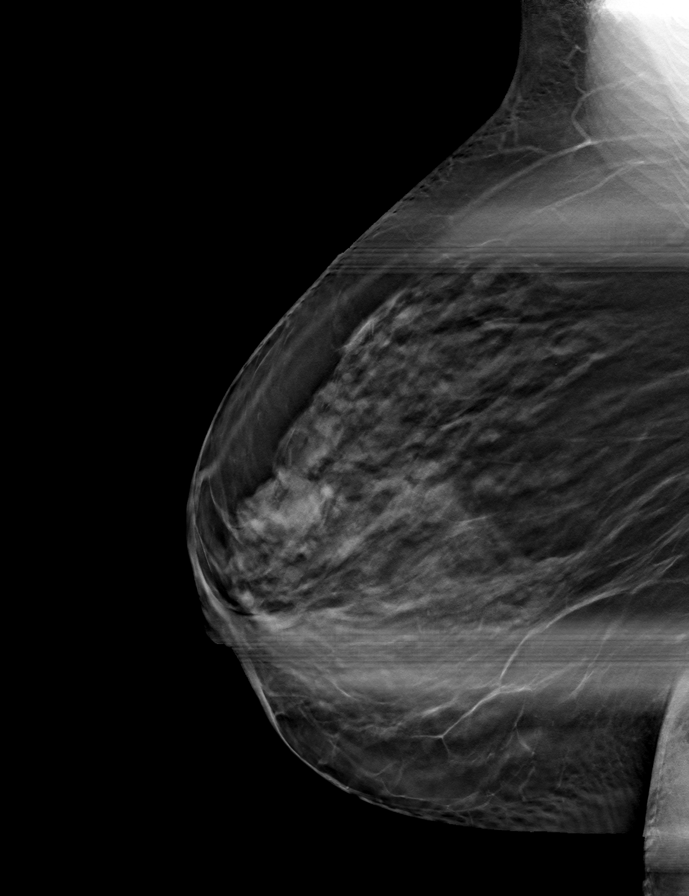
[frame 33/65]
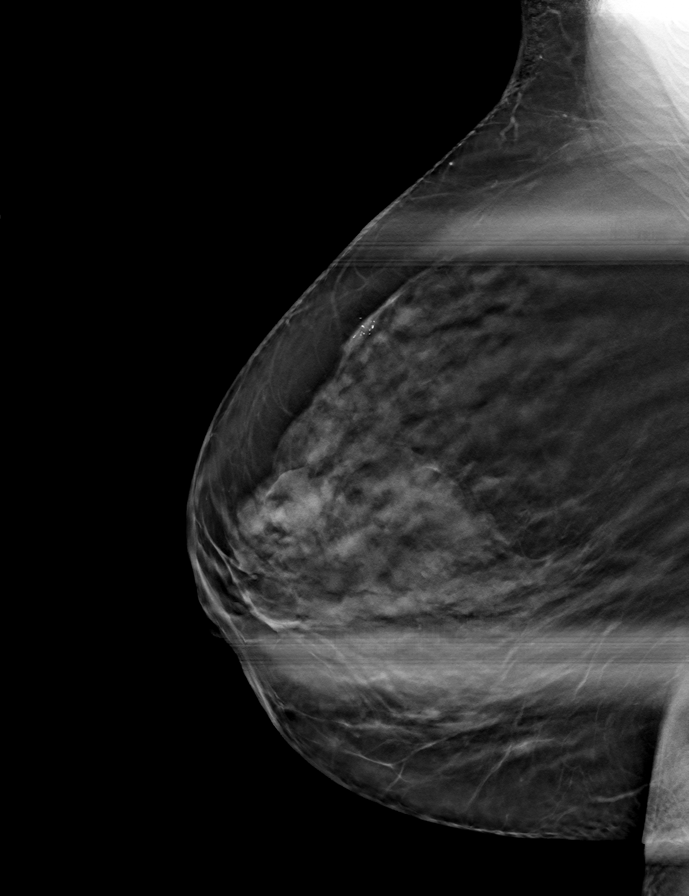

[9 of 32 positions shown; findings below may reference images not displayed]

ACR Breast Density Category c: The breast tissue is heterogeneously
dense, which may obscure small masses.
FINDINGS: The right breast asymmetry and possible distortion resolves on
today's imaging. The new calcifications spans 7 mm and are
indeterminate.
IMPRESSION: Indeterminate new right breast calcifications. No other suspicious
findings.

RECOMMENDATION:
Recommend stereotactic biopsy of the right breast calcifications.

I have discussed the findings and recommendations with the patient.
If applicable, a reminder letter will be sent to the patient
regarding the next appointment.

BI-RADS CATEGORY  4: Suspicious.

## 2023-08-28 ENCOUNTER — Other Ambulatory Visit: Payer: Self-pay | Admitting: Nurse Practitioner

## 2023-08-28 DIAGNOSIS — E782 Mixed hyperlipidemia: Secondary | ICD-10-CM

## 2023-09-02 ENCOUNTER — Ambulatory Visit: Admitting: Nurse Practitioner

## 2023-09-02 ENCOUNTER — Telehealth: Payer: Self-pay

## 2023-09-02 ENCOUNTER — Encounter: Payer: Self-pay | Admitting: Nurse Practitioner

## 2023-09-02 ENCOUNTER — Other Ambulatory Visit: Payer: Self-pay | Admitting: Family

## 2023-09-02 VITALS — BP 104/68 | HR 68 | Temp 98.0°F | Ht 62.0 in | Wt 192.0 lb

## 2023-09-02 DIAGNOSIS — F410 Panic disorder [episodic paroxysmal anxiety] without agoraphobia: Secondary | ICD-10-CM | POA: Diagnosis not present

## 2023-09-02 DIAGNOSIS — R600 Localized edema: Secondary | ICD-10-CM | POA: Diagnosis not present

## 2023-09-02 DIAGNOSIS — Z6835 Body mass index (BMI) 35.0-35.9, adult: Secondary | ICD-10-CM | POA: Diagnosis not present

## 2023-09-02 DIAGNOSIS — R6889 Other general symptoms and signs: Secondary | ICD-10-CM

## 2023-09-02 DIAGNOSIS — K219 Gastro-esophageal reflux disease without esophagitis: Secondary | ICD-10-CM | POA: Diagnosis not present

## 2023-09-02 DIAGNOSIS — F5101 Primary insomnia: Secondary | ICD-10-CM

## 2023-09-02 DIAGNOSIS — G8929 Other chronic pain: Secondary | ICD-10-CM

## 2023-09-02 DIAGNOSIS — F17200 Nicotine dependence, unspecified, uncomplicated: Secondary | ICD-10-CM | POA: Diagnosis not present

## 2023-09-02 DIAGNOSIS — R0981 Nasal congestion: Secondary | ICD-10-CM

## 2023-09-02 DIAGNOSIS — G500 Trigeminal neuralgia: Secondary | ICD-10-CM | POA: Diagnosis not present

## 2023-09-02 DIAGNOSIS — N1832 Chronic kidney disease, stage 3b: Secondary | ICD-10-CM

## 2023-09-02 DIAGNOSIS — E782 Mixed hyperlipidemia: Secondary | ICD-10-CM

## 2023-09-02 DIAGNOSIS — G2581 Restless legs syndrome: Secondary | ICD-10-CM

## 2023-09-02 DIAGNOSIS — M545 Low back pain, unspecified: Secondary | ICD-10-CM

## 2023-09-02 DIAGNOSIS — F3342 Major depressive disorder, recurrent, in full remission: Secondary | ICD-10-CM

## 2023-09-02 MED ORDER — OMEPRAZOLE 40 MG PO CPDR: 40.0000 mg | DELAYED_RELEASE_CAPSULE | Freq: Every day | ORAL | 1 refills | Status: DC

## 2023-09-02 MED ORDER — BENZONATATE 100 MG PO CAPS: 100.0000 mg | ORAL_CAPSULE | Freq: Two times a day (BID) | ORAL | 0 refills | Status: DC | PRN

## 2023-09-02 MED ORDER — BUPROPION HCL ER (XL) 150 MG PO TB24: 150.0000 mg | ORAL_TABLET | Freq: Every day | ORAL | 1 refills | Status: DC

## 2023-09-02 MED ORDER — ATORVASTATIN CALCIUM 40 MG PO TABS: 40.0000 mg | ORAL_TABLET | Freq: Every day | ORAL | 1 refills | Status: DC

## 2023-09-02 MED ORDER — ESCITALOPRAM OXALATE 20 MG PO TABS: 20.0000 mg | ORAL_TABLET | Freq: Every day | ORAL | 1 refills | Status: DC

## 2023-09-02 MED ORDER — HYDROCODONE BIT-HOMATROP MBR 5-1.5 MG/5ML PO SOLN
5.0000 mL | Freq: Four times a day (QID) | ORAL | 0 refills | Status: DC | PRN
Start: 2023-09-02 — End: 2023-12-27

## 2023-09-02 MED ORDER — FUROSEMIDE 40 MG PO TABS: 40.0000 mg | ORAL_TABLET | Freq: Every day | ORAL | 1 refills | Status: DC

## 2023-09-02 MED ORDER — PRAMIPEXOLE DIHYDROCHLORIDE 0.5 MG PO TABS: 0.5000 mg | ORAL_TABLET | Freq: Three times a day (TID) | ORAL | 1 refills | Status: DC

## 2023-09-02 MED ORDER — BUSPIRONE HCL 5 MG PO TABS: 5.0000 mg | ORAL_TABLET | Freq: Three times a day (TID) | ORAL | 1 refills | Status: DC

## 2023-09-02 NOTE — Telephone Encounter (Signed)
 Called and left a voicemail with [facility] to not fill the hycodan and sent over tessalon  pearles

## 2023-09-02 NOTE — Addendum Note (Signed)
 Addended by: Brylan Dec, MARY-MARGARET on: 09/02/2023 11:19 AM   Modules accepted: Orders

## 2023-09-02 NOTE — Addendum Note (Signed)
 Addended by: VIKTORIA ALAN MATSU on: 09/02/2023 04:12 PM   Modules accepted: Orders

## 2023-09-02 NOTE — Telephone Encounter (Signed)
 Copied from CRM [id number]. Topic: Clinical - Medication Question >> Sep 02, 2023 11:54 AM Willma SAUNDERS wrote: Reason for CRM: [facility] calling in regards to HYDROcodone  bit-homatropine (HYCODAN) 5-1.5 MG/5ML syrup. States patient is prescribe oxycodone with acetaminophen  from Dr Charlie Dolores and [facility] would like to verify if patient should be taking both medications.  [facility] can be reached at [phone]

## 2023-09-02 NOTE — Telephone Encounter (Signed)
**Note De-identified  [place] Obfuscation** Please advise 

## 2023-09-02 NOTE — Patient Instructions (Signed)
 Peripheral Edema  Peripheral edema is swelling that is caused by a buildup of fluid. Peripheral edema most often affects the lower legs, ankles, and feet. It can also develop in the arms, hands, and face. The area of the body that has peripheral edema will look swollen. It may also feel heavy or warm. Your clothes may start to feel tight. Pressing on the area may make a temporary dent in your skin (pitting edema). You may not be able to move your swollen arm or leg as much as usual. There are many causes of peripheral edema. It can happen because of a complication of other conditions such as heart failure, kidney disease, or a problem with your circulation. It also can be a side effect of certain medicines or happen because of an infection. It often happens to women during pregnancy. Sometimes, the cause is not known. Follow these instructions at home: Managing pain, stiffness, and swelling  Raise (elevate) your legs while you are sitting or lying down. Move around often to prevent stiffness and to reduce swelling. Do not sit or stand for long periods of time. Do not wear tight clothing. Do not wear garters on your upper legs. Exercise your legs to get your circulation going. This helps to move the fluid back into your blood vessels, and it may help the swelling go down. Wear compression stockings as told by your health care provider. These stockings help to prevent blood clots and reduce swelling in your legs. It is important that these are the correct size. These stockings should be prescribed by your doctor to prevent possible injuries. If elastic bandages or wraps are recommended, use them as told by your health care provider. Medicines Take over-the-counter and prescription medicines only as told by your health care provider. Your health care provider may prescribe medicine to help your body get rid of excess water (diuretic). Take this medicine if you are told to take it. General  instructions Eat a low-salt (low-sodium) diet as told by your health care provider. Sometimes, eating less salt may reduce swelling. Pay attention to any changes in your symptoms. Moisturize your skin daily to help prevent skin from cracking and draining. Keep all follow-up visits. This is important. Contact a health care provider if: You have a fever. You have swelling in only one leg. You have increased swelling, redness, or pain in one or both of your legs. You have drainage or sores at the area where you have edema. Get help right away if: You have edema that starts suddenly or is getting worse, especially if you are pregnant or have a medical condition. You develop shortness of breath, especially when you are lying down. You have pain in your chest or abdomen. You feel weak. You feel like you will faint. These symptoms may be an emergency. Get help right away. Call 911. Do not wait to see if the symptoms will go away. Do not drive yourself to the hospital. Summary Peripheral edema is swelling that is caused by a buildup of fluid. Peripheral edema most often affects the lower legs, ankles, and feet. Move around often to prevent stiffness and to reduce swelling. Do not sit or stand for long periods of time. Pay attention to any changes in your symptoms. Contact a health care provider if you have edema that starts suddenly or is getting worse, especially if you are pregnant or have a medical condition. Get help right away if you develop shortness of breath, especially when lying down.  This information is not intended to replace advice given to you by your health care provider. Make sure you discuss any questions you have with your health care provider. Document Revised: 10/17/2020 Document Reviewed: 10/17/2020 Elsevier Patient Education  2024 [employer].

## 2023-09-02 NOTE — Telephone Encounter (Signed)
 No change to tessalon  perles

## 2023-09-02 NOTE — Progress Notes (Signed)
 Subjective:    Patient ID: Stacie Solis, female    DOB: [birth year], 63 y.o.   MRN: [id number]  Chief Complaint: medical management of chronic issues     HPI:  Stacie Solis is a 63 y.o. who identifies as a female who was assigned female at birth.   Social history: Lives with: husband Work history: retired   Water engineer in today for follow up of the following chronic medical issues:  1. Mixed hyperlipidemia Doe snot really watch diet and does no dedicated exercise. Lab Results  Component Value Date   CHOL 124 09/03/2022   HDL 37 (L) 09/03/2022   LDLCALC 64 09/03/2022   TRIG 129 09/03/2022   CHOLHDL 3.4 09/03/2022     2. Gastroesophageal reflux disease without esophagitis Is on achipex and does not work as well as other meds she has been on  3. Trigeminal neuralgia No complaints of pain today   4. Recurrent major depressive disorder, in full remission (HCC) Is on combination of wellbutrin  and lexapro . Says she is doing well right now.     09/02/2023   11:00 AM 03/08/2023    9:56 AM 10/19/2022   12:19 PM  Depression screen PHQ 2/9  Decreased Interest 1 1 1   Down, Depressed, Hopeless 1 2 1   PHQ - 2 Score 2 3 2   Altered sleeping 3 1 0  Tired, decreased energy 2 1 2   Change in appetite 1 2 1   Feeling bad or failure about yourself  0 1 0  Trouble concentrating 0 2 0  Moving slowly or fidgety/restless 0 1 1  Suicidal thoughts 0 0 0  PHQ-9 Score 8 11 6   Difficult doing work/chores Somewhat difficult Somewhat difficult Somewhat difficult      5. Panic attacks Denies any recent panic attacks. Sister is dying which has made things harder on her.    09/02/2023   11:00 AM 03/08/2023    9:59 AM 10/19/2022   12:19 PM 09/03/2022   10:45 AM  GAD 7 : Generalized Anxiety Score  Nervous, Anxious, on Edge 0 1 1 1   Control/stop worrying 1 2 1 1   Worry too much - different things 1 2 1 1   Trouble relaxing 1 2 1 1   Restless 1 2 1 1   Easily annoyed or irritable 1 2 1 1   Afraid -  awful might happen 0 2 1 1   Total GAD 7 Score 5 13 7 7   Anxiety Difficulty Somewhat difficult Somewhat difficult Somewhat difficult Somewhat difficult       6. RLS (restless legs syndrome) Is on mirapex  and works well when she is sleeping  7. Chronic midline low back pain without sciatica Back pain is good tyoday  8. Peripheral edema Has daily- never completely resolves   9. Primary insomnia Is on ambien  to sleep, but has not been taking it.. Sleeps about  3-4 hours  10. Smoker Smokes over a pack a day.  11. BMI 35.0-35.9,adult Weight is unchanged Wt Readings from Last 3 Encounters:  09/02/23 192 lb (87.1 kg)  03/14/23 191 lb (86.6 kg)  03/08/23 196 lb (88.9 kg)   BMI Readings from Last 3 Encounters:  09/02/23 35.12 kg/m  03/14/23 34.93 kg/m  03/08/23 35.85 kg/m         New complaints: None today  Allergies  Allergen Reactions   Sulfa Antibiotics Nausea Only   Outpatient Encounter Medications as of 09/02/2023  Medication Sig   albuterol  (VENTOLIN  HFA) 108 (90 Base) MCG/ACT inhaler  TAKE 2 PUFFS BY MOUTH EVERY 4 HOURS AS NEEDED   atorvastatin  (LIPITOR) 40 MG tablet TAKE 1 TABLET BY MOUTH EVERY DAY   buPROPion  (WELLBUTRIN  XL) 150 MG 24 hr tablet Take 1 tablet (150 mg total) by mouth daily. (Patient taking differently: Take 150 mg by mouth as needed.)   busPIRone  (BUSPAR ) 5 MG tablet TAKE 1 TABLET BY MOUTH THREE TIMES A DAY   cetirizine  (ZYRTEC ) 10 MG tablet Take 1 tablet (10 mg total) by mouth daily.   Cholecalciferol (VITAMIN D ) 50 MCG (2000 UT) tablet Take 2,000 Units by mouth daily.   diclofenac  Sodium (VOLTAREN ) 1 % GEL APPLY 4 G TOPICALLY 4 TIMES DAILY   escitalopram  (LEXAPRO ) 20 MG tablet Take 1 tablet (20 mg total) by mouth daily.   fenofibrate  micronized (LOFIBRA) 134 MG capsule TAKE 1 CAPSULE BY MOUTH EVERY DAY BEFORE BREAKFAST   fish oil-omega-3 fatty acids 1000 MG capsule Take 1 g by mouth daily.   fluticasone  (FLONASE ) 50 MCG/ACT nasal spray  Place 2 sprays into both nostrils daily.   furosemide  (LASIX ) 40 MG tablet Take 1.5 tablets (60 mg total) by mouth 2 (two) times daily.   magic mouthwash (lidocaine , diphenhydrAMINE, alum & mag hydroxide) suspension Swish and swallow 5 mLs 4 (four) times daily.   magic mouthwash w/lidocaine  SOLN Take 10 mLs by mouth 3 (three) times daily as needed for mouth pain.   meloxicam  (MOBIC ) 15 MG tablet Take 15 mg by mouth daily as needed for pain.   metaxalone (SKELAXIN) 800 MG tablet Take 800 mg by mouth 3 (three) times daily.   naloxone (NARCAN) nasal spray 4 mg/0.1 mL    omeprazole  (PRILOSEC) 40 MG capsule Take 1 capsule (40 mg total) by mouth daily.   PERCOCET 5-325 MG tablet Take 1 tablet by mouth 4 (four) times daily as needed for moderate pain.   pramipexole  (MIRAPEX ) 0.5 MG tablet Take 1 tablet (0.5 mg total) by mouth 3 (three) times daily.   predniSONE  (STERAPRED UNI-PAK 21 TAB) 10 MG (21) TBPK tablet Use as directed   promethazine -dextromethorphan (PROMETHAZINE -DM) 6.25-15 MG/5ML syrup Take 5 mLs by mouth 4 (four) times daily as needed for cough. (Patient not taking: Reported on 06/04/2023)   triamcinolone  (NASACORT ) 55 MCG/ACT AERO nasal inhaler PLACE 2 SPRAYS INTO THE NOSE DAILY.   Vaginal Lubricant (REPLENS) GEL Place 1 Application vaginally daily as needed (moisture/irritation).   No facility-administered encounter medications on file as of 09/02/2023.    Past Surgical History:  Procedure Laterality Date   BRAIN SURGERY     trig neuralgia   BREAST BIOPSY Right 11/07/2020   fibroadenoma   CHOLECYSTECTOMY      Family History  Problem Relation Age of Onset   Hypertension Mother    Diabetes Mother    Arthritis Mother    Lung cancer Maternal Grandfather       Controlled substance contract: n/a     Review of Systems  Constitutional:  Negative for diaphoresis.  Eyes:  Negative for pain.  Respiratory:  Negative for shortness of breath.   Cardiovascular:  Negative for chest  pain, palpitations and leg swelling.  Gastrointestinal:  Negative for abdominal pain.  Endocrine: Negative for polydipsia.  Skin:  Negative for rash.  Neurological:  Negative for dizziness, weakness and headaches.  Hematological:  Does not bruise/bleed easily.  All other systems reviewed and are negative.      Objective:   Physical Exam Vitals and nursing note reviewed.  Constitutional:      General:  She is not in acute distress.    Appearance: Normal appearance. She is well-developed.  HENT:     Head: Normocephalic.     Right Ear: Tympanic membrane normal.     Left Ear: Tympanic membrane normal.     Nose: Nose normal.     Mouth/Throat:     Mouth: Mucous membranes are moist.  Eyes:     Pupils: Pupils are equal, round, and reactive to light.  Neck:     Vascular: No carotid bruit or JVD.  Cardiovascular:     Rate and Rhythm: Normal rate and regular rhythm.     Heart sounds: Normal heart sounds.  Pulmonary:     Effort: Pulmonary effort is normal. No respiratory distress.     Breath sounds: Normal breath sounds. No wheezing or rales.  Chest:     Chest wall: No tenderness.  Abdominal:     General: Bowel sounds are normal. There is no distension or abdominal bruit.     Palpations: Abdomen is soft. There is no hepatomegaly, splenomegaly, mass or pulsatile mass.     Tenderness: There is no abdominal tenderness.  Musculoskeletal:        General: Normal range of motion.     Cervical back: Normal range of motion and neck supple.     Right lower leg: Edema (1+) present.     Left lower leg: Edema (1+) present.  Lymphadenopathy:     Cervical: No cervical adenopathy.  Skin:    General: Skin is warm and dry.     Comments: Circulatory change to bil lower ext  Neurological:     Mental Status: She is alert and oriented to person, place, and time.     Deep Tendon Reflexes: Reflexes are normal and symmetric.  Psychiatric:        Behavior: Behavior normal.        Thought Content:  Thought content normal.        Judgment: Judgment normal.     BP 104/68   Pulse 68   Temp 98 F (36.7 C) (Temporal)   Ht 5' 2 (1.575 m)   Wt 192 lb (87.1 kg)   SpO2 94%   BMI 35.12 kg/m         Assessment & Plan:   Stacie Solis comes in today with chief complaint of medical management of chronic issues    Diagnosis and orders addressed:  1. Mixed hyperlipidemia Low fat diet. - atorvastatin  (LIPITOR) 40 MG tablet; Take 1 tablet (40 mg total) by mouth daily.  Dispense: 90 tablet; Refill: 1 - fenofibrate  micronized (LOFIBRA) 134 MG capsule; TAKE 1 CAPSULE BY MOUTH DAILY BEFORE BREAKFAST.  Dispense: 90 capsule; Refill: 1 - CBC with Differential/Platelet - CMP14+EGFR - Lipid panel  2. Gastroesophageal reflux disease without esophagitis Stop achipix and start back on omeprazole  daily Avoid spicy foods Do not eat 2 hours prior to bedtime  - omeprazole  (PRILOSEC) 40 MG capsule; Take 1 capsule (40 mg total) by mouth daily.  Dispense: 90 capsule; Refill: 1  3. Trigeminal neuralgia  4. Recurrent major depressive disorder, in full remission (HCC) Stress management - buPROPion  (WELLBUTRIN  XL) 150 MG 24 hr tablet; Take 1 tablet (150 mg total) by mouth daily.  Dispense: 90 tablet; Refill: 1 - escitalopram  (LEXAPRO ) 20 MG tablet; Take 1 tablet (20 mg total) by mouth daily.  Dispense: 90 tablet; Refill: 1  5. Panic attacks - busPIRone  (BUSPAR ) 5 MG tablet; Take 1 tablet (5 mg total) by mouth 3 (  three) times daily.  Dispense: 90 tablet; Refill: 5  6. RLS (restless legs syndrome) Keeps legs warm at night - pramipexole  (MIRAPEX ) 0.5 MG tablet; Take 1 tablet (0.5 mg total) by mouth 3 (three) times daily.  Dispense: 270 tablet; Refill: 1  7. Chronic midline low back pain without sciatica Moist heat rest  8. Primary insomnia Bedtime routine Try 1/2 tablet night  9. Smoker Smoking cessation encouraged Ordered low dose CT scan  10. BMI 35.0-35.9,adult Discussed diet and  exercise for person with BMI >25 Will recheck weight in 3-6 months   11. Nasal congestion Continue nasacort  nasal spray - cetirizine  (ZYRTEC ) 10 MG tablet; Take 1 tablet (10 mg total) by mouth daily.  Dispense: 30 tablet; Refill: 11  12. Peripheral edema Elevate legs when sitting - furosemide  (LASIX ) 40 MG tablet; Take 1 tablet (40 mg total) by mouth BId.  Dispense: 180 tablet; Refill: 1  13. Chronic kidney disease Labs pending  Labs pending Health Maintenance reviewed Diet and exercise encouraged  Follow up plan: 6 months   Mary-Margaret Gladis, FNP

## 2023-09-02 NOTE — Addendum Note (Signed)
 Addended by: GLADIS MUSTARD on: 09/02/2023 11:21 AM   Modules accepted: Orders

## 2023-09-03 ENCOUNTER — Ambulatory Visit: Payer: 59 | Admitting: Nurse Practitioner

## 2023-09-03 ENCOUNTER — Ambulatory Visit: Payer: Self-pay | Admitting: Nurse Practitioner

## 2023-09-03 LAB — CMP14+EGFR
ALT: 20 IU/L (ref 0–32)
AST: 26 IU/L (ref 0–40)
Albumin: 4.3 g/dL (ref 3.9–4.9)
Alkaline Phosphatase: 89 IU/L (ref 44–121)
BUN/Creatinine Ratio: 16 (ref 12–28)
BUN: 21 mg/dL (ref 8–27)
Bilirubin Total: 0.4 mg/dL (ref 0.0–1.2)
CO2: 23 mmol/L (ref 20–29)
Calcium: 9.4 mg/dL (ref 8.7–10.3)
Chloride: 104 mmol/L (ref 96–106)
Creatinine, Ser: 1.29 mg/dL — ABNORMAL HIGH (ref 0.57–1.00)
Globulin, Total: 2.2 g/dL (ref 1.5–4.5)
Glucose: 91 mg/dL (ref 70–99)
Potassium: 4.8 mmol/L (ref 3.5–5.2)
Sodium: 142 mmol/L (ref 134–144)
Total Protein: 6.5 g/dL (ref 6.0–8.5)
eGFR: 46 mL/min/1.73 — ABNORMAL LOW (ref >59)

## 2023-09-03 LAB — CBC WITH DIFFERENTIAL/PLATELET
Basophils Absolute: 0.2 x10E3/uL (ref 0.0–0.2)
Basos: 3 %
EOS (ABSOLUTE): 0.2 x10E3/uL (ref 0.0–0.4)
Eos: 3 %
Hematocrit: 41.1 % (ref 34.0–46.6)
Hemoglobin: 13.5 g/dL (ref 11.1–15.9)
Immature Grans (Abs): 0.1 x10E3/uL (ref 0.0–0.1)
Immature Granulocytes: 1 %
Lymphocytes Absolute: 2 x10E3/uL (ref 0.7–3.1)
Lymphs: 33 %
MCH: 30.2 pg (ref 26.6–33.0)
MCHC: 32.8 g/dL (ref 31.5–35.7)
MCV: 92 fL (ref 79–97)
Monocytes Absolute: 0.6 x10E3/uL (ref 0.1–0.9)
Monocytes: 10 %
Neutrophils Absolute: 3 x10E3/uL (ref 1.4–7.0)
Neutrophils: 50 %
Platelets: 175 x10E3/uL (ref 150–450)
RBC: 4.47 x10E6/uL (ref 3.77–5.28)
RDW: 12.7 % (ref 11.7–15.4)
WBC: 6 x10E3/uL (ref 3.4–10.8)

## 2023-09-03 LAB — LIPID PANEL
Chol/HDL Ratio: 3.2 ratio (ref 0.0–4.4)
Cholesterol, Total: 105 mg/dL (ref 100–199)
HDL: 33 mg/dL — ABNORMAL LOW
LDL Chol Calc (NIH): 51 mg/dL (ref 0–99)
Triglycerides: 117 mg/dL (ref 0–149)
VLDL Cholesterol Cal: 21 mg/dL (ref 5–40)

## 2023-09-28 ENCOUNTER — Other Ambulatory Visit: Payer: Self-pay | Admitting: Nurse Practitioner

## 2023-09-28 DIAGNOSIS — E782 Mixed hyperlipidemia: Secondary | ICD-10-CM

## 2023-10-26 ENCOUNTER — Other Ambulatory Visit: Payer: Self-pay | Admitting: Nurse Practitioner

## 2023-10-26 DIAGNOSIS — F3342 Major depressive disorder, recurrent, in full remission: Secondary | ICD-10-CM

## 2023-12-27 ENCOUNTER — Encounter: Payer: Self-pay | Admitting: Nurse Practitioner

## 2023-12-27 ENCOUNTER — Ambulatory Visit: Payer: Self-pay

## 2023-12-27 ENCOUNTER — Ambulatory Visit (INDEPENDENT_AMBULATORY_CARE_PROVIDER_SITE_OTHER): Admitting: Nurse Practitioner

## 2023-12-27 VITALS — BP 116/64 | HR 81 | Temp 96.3°F | Ht 62.0 in | Wt 196.0 lb

## 2023-12-27 DIAGNOSIS — J209 Acute bronchitis, unspecified: Secondary | ICD-10-CM

## 2023-12-27 DIAGNOSIS — J42 Unspecified chronic bronchitis: Secondary | ICD-10-CM

## 2023-12-27 MED ORDER — BENZONATATE 100 MG PO CAPS: 100.0000 mg | ORAL_CAPSULE | Freq: Two times a day (BID) | ORAL | 0 refills | Status: DC | PRN

## 2023-12-27 MED ORDER — DOXYCYCLINE HYCLATE 100 MG PO TABS: 100.0000 mg | ORAL_TABLET | Freq: Two times a day (BID) | ORAL | 0 refills | Status: DC

## 2023-12-27 MED ORDER — PREDNISONE 10 MG (21) PO TBPK: ORAL_TABLET | ORAL | 0 refills | Status: DC

## 2023-12-27 NOTE — Patient Instructions (Signed)

## 2023-12-27 NOTE — Telephone Encounter (Signed)
 FYI Only or Action Required?: Action required by provider: request for appointment.  Patient was last seen in primary care on 09/02/2023 by Gladis Mustard, FNP.  Called Nurse Triage reporting Cough.  Symptoms began several weeks ago.  Interventions attempted: Rest, hydration, or home remedies.  Symptoms are: gradually worsening. Productive cough with green mucus.Sinus drainage, headache.  Triage Disposition: See HCP Within 4 Hours (Or PCP Triage)  Patient/caregiver understands and will follow disposition?: Yes    Copied from CRM #[id number]. Topic: Clinical - Red Word Triage >> Dec 27, 2023 12:14 PM Emylou G wrote: [facility] that prompted transfer to Nurse Triage: Sinus infection, aching, headache, a lot of coughing ( mucus off and on colored thick ) Reason for Disposition  [1] MILD difficulty breathing (e.g., minimal/no SOB at rest, SOB with walking, pulse < 100) AND [2] still present when not coughing  Answer Assessment - Initial Assessment Questions 1. ONSET: When did the cough begin?      2 weeks 2. SEVERITY: How bad is the cough today?      severe 3. SPUTUM: Describe the color of your sputum (e.g., none, dry cough; clear, white, yellow, green)     Thick green-yellow 4. HEMOPTYSIS: Are you coughing up any blood? If Yes, ask: How much? (e.g., flecks, streaks, tablespoons, etc.)     no 5. DIFFICULTY BREATHING: Are you having difficulty breathing? If Yes, ask: How bad is it? (e.g., mild, moderate, severe)      mild 6. FEVER: Do you have a fever? If Yes, ask: What is your temperature, how was it measured, and when did it start?     Feels feverish 7. CARDIAC HISTORY: Do you have any history of heart disease? (e.g., heart attack, congestive heart failure)      no 8. LUNG HISTORY: Do you have any history of lung disease?  (e.g., pulmonary embolus, asthma, emphysema)     no 9. PE RISK FACTORS: Do you have a history of blood clots? (or: recent major  surgery, recent prolonged travel, bedridden)     no 10. OTHER SYMPTOMS: Do you have any other symptoms? (e.g., runny nose, wheezing, chest pain)       Sinus drainage 11. PREGNANCY: Is there any chance you are pregnant? When was your last menstrual period?       no 12. TRAVEL: Have you traveled out of the country in the last month? (e.g., travel history, exposures)       no  Protocols used: Cough - Acute Productive-A-AH

## 2023-12-27 NOTE — Progress Notes (Signed)
 Subjective:    Patient ID: Stacie Solis, female    DOB: 22-Jan-1961, 63 y.o.   MRN: [id number]   Chief Complaint: cough  Cough This is a new problem. The problem has been gradually worsening. The problem occurs every few minutes. The cough is Productive of sputum. Associated symptoms include chills, ear congestion, nasal congestion and rhinorrhea. Pertinent negatives include no fever, sore throat or shortness of breath. The symptoms are aggravated by lying down. She has tried OTC cough suppressant for the symptoms. The treatment provided mild relief.    Patient Active Problem List   Diagnosis Date Noted   Congestive heart failure (CHF) (HCC) 11/28/2022   Chronic kidney disease (CKD), stage III (moderate) (HCC) 11/28/2022   Peripheral edema 09/03/2022   Chronic lower back pain 06/08/2014   Smoker 03/03/2014   BMI 35.0-35.9,adult 03/03/2014   RLS (restless legs syndrome) 03/03/2014   Insomnia 03/03/2014   Depression 01/05/2013   GERD (gastroesophageal reflux disease) 09/11/2012   Hyperlipidemia 09/11/2012   Panic attacks 09/11/2012   Trigeminal neuralgia 09/11/2012       Review of Systems  Constitutional:  Positive for chills. Negative for fever.  HENT:  Positive for rhinorrhea. Negative for sore throat.   Respiratory:  Positive for cough. Negative for shortness of breath.        Objective:   Physical Exam Constitutional:      Appearance: Normal appearance.  HENT:     Right Ear: Tympanic membrane normal.     Left Ear: Tympanic membrane normal.     Nose: Congestion and rhinorrhea present.     Mouth/Throat:     Pharynx: No oropharyngeal exudate or posterior oropharyngeal erythema.  Cardiovascular:     Rate and Rhythm: Normal rate and regular rhythm.     Heart sounds: Normal heart sounds.  Pulmonary:     Breath sounds: Rhonchi (throughout) present.     Comments: Wet cough Neurological:     General: No focal deficit present.     Mental Status: She is alert and  oriented to person, place, and time.  Psychiatric:        Mood and Affect: Mood normal.        Behavior: Behavior normal.    BP 116/64   Pulse 81   Temp (!) 96.3 F (35.7 C) (Temporal)   Ht 5' 2 (1.575 m)   Wt 196 lb (88.9 kg)   SpO2 90%   BMI 35.85 kg/m         Assessment & Plan:   Stacie Solis in today with chief complaint of Cough (Congestion. Has been sick for 2 weeks)   1. Acute exacerbation of chronic bronchitis (HCC) (Primary) 1. Take meds as prescribed 2. Use a cool mist humidifier especially during the winter months and when heat has been humid. 3. Use saline nose sprays frequently 4. Saline irrigations of the nose can be very helpful if done frequently.  * 4X daily for 1 week*  * Use of a nettie pot can be helpful with this. Follow directions with this* 5. Drink plenty of fluids 6. Keep thermostat turn down low 7.For any cough or congestion- tessaon perles 8. For fever or aces or pains- take tylenol  or ibuprofen appropriate for age and weight.  * for fevers greater than 101 orally you may alternate ibuprofen and tylenol  every  3 hours.   Smoking cessation encouraged - predniSONE  (STERAPRED UNI-PAK 21 TAB) 10 MG (21) TBPK tablet; As directed x 6 days  Dispense: 21 tablet; Refill: 0 - benzonatate  (TESSALON ) 100 MG capsule; Take 1 capsule (100 mg total) by mouth 2 (two) times daily as needed for cough.  Dispense: 20 capsule; Refill: 0 - doxycycline  (VIBRA -TABS) 100 MG tablet; Take 1 tablet (100 mg total) by mouth 2 (two) times daily. 1 po bid  Dispense: 20 tablet; Refill: 0    The above assessment and management plan was discussed with the patient. The patient verbalized understanding of and has agreed to the management plan. Patient is aware to call the clinic if symptoms persist or worsen. Patient is aware when to return to the clinic for a follow-up visit. Patient educated on when it is appropriate to go to the emergency department.   Mary-Margaret Gladis,  FNP

## 2023-12-27 NOTE — Telephone Encounter (Signed)
Patient has appt scheduled for today.

## 2023-12-30 ENCOUNTER — Ambulatory Visit: Payer: 59 | Attending: Nurse Practitioner | Admitting: Nurse Practitioner

## 2023-12-30 ENCOUNTER — Encounter: Payer: Self-pay | Admitting: Nurse Practitioner

## 2023-12-30 NOTE — Progress Notes (Deleted)
  Cardiology Office Note   Date:  12/30/2023  ID:  Stacie Solis, DOB 1960-04-15, MRN [id number] PCP: Gladis Mustard, FNP  [facility] Providers Cardiologist:  Diannah SHAUNNA Maywood, MD { Click to update primary MD,subspecialty MD or APP then REFRESH:1}    History of Present Illness Stacie Solis is a 63 y.o. female with a PMH of leg edema, hyperlipidemia, CKD stage IIIb, anxiety, depression, chronic back pain, and GERD, who presents today for 1 month follow-up.  Last seen by Dr. Mallipeddi on 11/28/2023. Pt noted DOE associated with leg edema x 1 month.  Patient noted improvement in symptoms with Lasix  40 mg daily, patient had regained 10 pounds in 1 to 2 weeks.  Echocardiogram was obtained and was instructed to increase Lasix  to 60 mg twice daily and to obtain venous Doppler lower ultrasound to rule out DVT.  Lower extremity Doppler was negative for DVT.  Echocardiogram revealed normal LVEF, grade 1 DD, no significant valvular abnormalities.  BNP 485.    Today she is here for 1 month follow-up.  She states    ROS: ***  Studies Reviewed      *** Risk Assessment/Calculations {Does this patient have ATRIAL FIBRILLATION?:[phone]} No BP recorded.  {Refresh Note OR Click here to enter BP  :1}***       Physical Exam VS:  There were no vitals taken for this visit.       Wt Readings from Last 3 Encounters:  12/27/23 196 lb (88.9 kg)  09/02/23 192 lb (87.1 kg)  03/14/23 191 lb (86.6 kg)    GEN: Well nourished, well developed in no acute distress NECK: No JVD; No carotid bruits CARDIAC: ***RRR, no murmurs, rubs, gallops RESPIRATORY:  Clear to auscultation without rales, wheezing or rhonchi  ABDOMEN: Soft, non-tender, non-distended EXTREMITIES:  No edema; No deformity   ASSESSMENT AND PLAN ***    {Are you ordering a CV Procedure (e.g. stress test, cath, DCCV, TEE, etc)?   Press F2        :[id number]}  Dispo: ***  Signed, Almarie Crate, NP

## 2024-01-15 ENCOUNTER — Ambulatory Visit: Payer: Self-pay

## 2024-01-15 NOTE — Telephone Encounter (Signed)
 Patient called back to get an update from her previous call. Patient was told that the information was sent to the provider and are waiting for a response. Patient is requesting a call back with recommendations.

## 2024-01-15 NOTE — Telephone Encounter (Signed)
 Patient was seen on 12/27/2023  --given antibiotic and prednisone  ---patient states she is not any better--- ---denies difficulty breathing Throat hurts now as well--- --congestion present --patient used Mucinex  D and Dayquil Sinus  Patient did not want to come back in and states that her symptoms are the same Patient states she usually has to have another antibiotic (she believes Augmentin ), and would like a cough medicine that she said her pcp usually gives her, and wondered if her pcp would want to change her daily allergy pill (cetirizine ) to anything else This RN advised her that re-evaluation in the office is recommended but patient states her symptoms are the same Patient wants someone to contact her about what her PCP recommends  FYI Only or Action Required?: Action required by provider: clinical question for provider, update on patient condition, and Patient requesting medications--not better from previous visit.  Patient was last seen in primary care on 12/27/2023 by Gladis Mustard, FNP.  Called Nurse Triage reporting Cough.  Symptoms began several weeks ago.  Interventions attempted: OTC medications: Dayquil Sinus, Mucinex  D , Prescription medications: antibiotic and prednisone  from 12/27/2023, and Rest, hydration, or home remedies.  Symptoms are: gradually worsening.  Triage Disposition: See Physician Within 24 Hours  Patient/caregiver understands and will follow disposition?: No, wishes to speak to PCP                    Copied from CRM #[id number]. Topic: Clinical - Red Word Triage >> Jan 15, 2024  8:14 AM Myrick T wrote: Red Word that prompted transfer to Nurse Triage: patient stated she was seen 10/31 and was given medication but her symptoms has gotten worse. She says it is hard for her to swallow along with a bad cough and the cold is not breaking up. Reason for Disposition  [1] Continuous (nonstop) coughing interferes with work or school AND  [2] no improvement using cough treatment per Care Advice  Answer Assessment - Initial Assessment Questions Patient was seen on 12/27/2023  --given antibiotic and prednisone  ---patient states she is not any better--- --denies difficulty breathing Throat hurts now as well--- --congestion present --patient used Mucinex  D and Dayquil Sinus  Patient did not want to come back in and states that her symptoms are the same Patient states she usually has to have another antibiotic (she believes Augmentin ), and would like a cough medicine that she said her pcp usually gives her, and wondered if her pcp would want to change her daily allergy pill (cetirizine ) to anything else This RN advised her that re-evaluation in the office is recommended but patient states her symptoms are the same Patient wants someone to contact her about what her PCP recommends  Patient is advised to call us  back if anything changes or with any further questions/concerns. Patient is advised that if anything worsens to go to the Emergency Room. Patient verbalized understanding.    5. DIFFICULTY BREATHING: Are you having difficulty breathing? If Yes, ask: How bad is it? (e.g., mild, moderate, severe)      congested 6. FEVER: Do you have a fever? If Yes, ask: What is your temperature, how was it measured, and when did it start?     Feels feverish cold and hot but hasn't checked a temp 10. OTHER SYMPTOMS: Do you have any other symptoms? (e.g., runny nose, wheezing, chest pain)       Sore throat, hoarse voice  Protocols used: Cough - Acute Non-Productive-A-AH

## 2024-01-16 ENCOUNTER — Other Ambulatory Visit: Payer: Self-pay | Admitting: Nurse Practitioner

## 2024-01-16 ENCOUNTER — Ambulatory Visit: Payer: Self-pay

## 2024-01-16 MED ORDER — AMOXICILLIN-POT CLAVULANATE 875-125 MG PO TABS: 1.0000 | ORAL_TABLET | Freq: Two times a day (BID) | ORAL | 0 refills | Status: DC

## 2024-01-16 NOTE — Progress Notes (Signed)
 Meds ordered this encounter  Medications   amoxicillin -clavulanate (AUGMENTIN ) 875-125 MG tablet    Sig: Take 1 tablet by mouth 2 (two) times daily.    Dispense:  20 tablet    Refill:  0    Supervising Provider:   MARYANNE CHEW A [[id number]]   Mary-Margaret Gladis, FNP

## 2024-01-16 NOTE — Telephone Encounter (Signed)
 Called and spoke with patient and she said she has already spoken with nurse from our office and issue has been taken care of. Will close this encounter.

## 2024-01-16 NOTE — Telephone Encounter (Signed)
 Patient returned call stating symptoms are the same . Requesting an update regarding her request. Best contact number is [phone].

## 2024-01-16 NOTE — Telephone Encounter (Signed)
 FYI Only or Action Required?: Action required by provider: clinical question for provider and update on patient condition.  Patient was last seen in primary care on 12/27/2023 by Gladis Mustard, FNP.  Called Nurse Triage reporting Advice Only.   Triage Disposition: Call PCP When Office is Open  Patient/caregiver understands and will follow disposition?: Yes       Copied from CRM #[id number]. Topic: Clinical - Red Word Triage >> Jan 15, 2024  2:41 PM Gustabo D wrote: Pt is call again bc she never heard back previous crm message- Red Word that prompted transfer to Nurse Triage: patient stated she was seen 10/31 and was given medication but her symptoms has gotten worse. She says it is hard for her to swallow along with a bad cough and the cold is not breaking up. >> Jan 16, 2024 11:08 AM Montie POUR wrote: She did not hear anything back about the above. Her symptoms are worse. (Congestion, cough, headache, sore throat) Reason for Disposition  [1] Caller requesting NON-URGENT health information AND [2] PCP's office is the best resource  Answer Assessment - Initial Assessment Questions Patient returned call stating symptoms are the same from when she called yesterday. Requesting an update regarding her request. Best contact number is [phone].   1. REASON FOR CALL: What is the main reason for your call? or How can I best help you?     Update on request from yesterday  2. SYMPTOMS : Do you have any symptoms?  Protocols used: Information Only Call - No Triage-A-AH

## 2024-01-31 ENCOUNTER — Other Ambulatory Visit: Payer: Self-pay | Admitting: Nurse Practitioner

## 2024-02-04 ENCOUNTER — Ambulatory Visit: Admitting: Nurse Practitioner

## 2024-02-06 ENCOUNTER — Encounter: Payer: Self-pay | Admitting: Nurse Practitioner

## 2024-02-06 ENCOUNTER — Ambulatory Visit: Admitting: Nurse Practitioner

## 2024-02-06 VITALS — BP 119/74 | HR 106 | Temp 97.2°F | Ht 62.0 in | Wt 192.0 lb

## 2024-02-06 DIAGNOSIS — F3342 Major depressive disorder, recurrent, in full remission: Secondary | ICD-10-CM

## 2024-02-06 DIAGNOSIS — N1832 Chronic kidney disease, stage 3b: Secondary | ICD-10-CM | POA: Diagnosis not present

## 2024-02-06 DIAGNOSIS — R600 Localized edema: Secondary | ICD-10-CM

## 2024-02-06 DIAGNOSIS — F17200 Nicotine dependence, unspecified, uncomplicated: Secondary | ICD-10-CM

## 2024-02-06 DIAGNOSIS — G2581 Restless legs syndrome: Secondary | ICD-10-CM | POA: Diagnosis not present

## 2024-02-06 DIAGNOSIS — E782 Mixed hyperlipidemia: Secondary | ICD-10-CM

## 2024-02-06 DIAGNOSIS — K219 Gastro-esophageal reflux disease without esophagitis: Secondary | ICD-10-CM | POA: Diagnosis not present

## 2024-02-06 DIAGNOSIS — F410 Panic disorder [episodic paroxysmal anxiety] without agoraphobia: Secondary | ICD-10-CM

## 2024-02-06 DIAGNOSIS — Z6835 Body mass index (BMI) 35.0-35.9, adult: Secondary | ICD-10-CM | POA: Diagnosis not present

## 2024-02-06 DIAGNOSIS — I509 Heart failure, unspecified: Secondary | ICD-10-CM | POA: Diagnosis not present

## 2024-02-06 DIAGNOSIS — G500 Trigeminal neuralgia: Secondary | ICD-10-CM | POA: Diagnosis not present

## 2024-02-06 LAB — LIPID PANEL
Chol/HDL Ratio: 3.2 ratio (ref 0.0–4.4)
Cholesterol, Total: 110 mg/dL (ref 100–199)
HDL: 34 mg/dL — ABNORMAL LOW (ref >39)
LDL Chol Calc (NIH): 53 mg/dL (ref 0–99)
Triglycerides: 131 mg/dL (ref 0–149)
VLDL Cholesterol Cal: 23 mg/dL (ref 5–40)

## 2024-02-06 LAB — CMP14+EGFR
ALT: 19 IU/L (ref 0–32)
AST: 25 IU/L (ref 0–40)
Albumin: 4.4 g/dL (ref 3.9–4.9)
Alkaline Phosphatase: 93 IU/L (ref 49–135)
BUN/Creatinine Ratio: 11 — ABNORMAL LOW (ref 12–28)
BUN: 16 mg/dL (ref 8–27)
Bilirubin Total: 0.5 mg/dL (ref 0.0–1.2)
CO2: 23 mmol/L (ref 20–29)
Calcium: 9.8 mg/dL (ref 8.7–10.3)
Chloride: 103 mmol/L (ref 96–106)
Creatinine, Ser: 1.4 mg/dL — ABNORMAL HIGH (ref 0.57–1.00)
Globulin, Total: 2.5 g/dL (ref 1.5–4.5)
Glucose: 104 mg/dL — ABNORMAL HIGH (ref 70–99)
Potassium: 4.8 mmol/L (ref 3.5–5.2)
Sodium: 141 mmol/L (ref 134–144)
Total Protein: 6.9 g/dL (ref 6.0–8.5)
eGFR: 42 mL/min/1.73 — ABNORMAL LOW (ref >59)

## 2024-02-06 LAB — CBC WITH DIFFERENTIAL/PLATELET
Basophils Absolute: 0.2 x10E3/uL (ref 0.0–0.2)
Basos: 2 %
EOS (ABSOLUTE): 0.1 x10E3/uL (ref 0.0–0.4)
Eos: 2 %
Hematocrit: 43.1 % (ref 34.0–46.6)
Hemoglobin: 14.2 g/dL (ref 11.1–15.9)
Immature Grans (Abs): 0.1 x10E3/uL (ref 0.0–0.1)
Immature Granulocytes: 1 %
Lymphocytes Absolute: 2.7 x10E3/uL (ref 0.7–3.1)
Lymphs: 33 %
MCH: 30.5 pg (ref 26.6–33.0)
MCHC: 32.9 g/dL (ref 31.5–35.7)
MCV: 93 fL (ref 79–97)
Monocytes Absolute: 0.8 x10E3/uL (ref 0.1–0.9)
Monocytes: 9 %
Neutrophils Absolute: 4.4 x10E3/uL (ref 1.4–7.0)
Neutrophils: 53 %
Platelets: 232 x10E3/uL (ref 150–450)
RBC: 4.66 x10E6/uL (ref 3.77–5.28)
RDW: 13.4 % (ref 11.7–15.4)
WBC: 8.2 x10E3/uL (ref 3.4–10.8)

## 2024-02-06 MED ORDER — ATORVASTATIN CALCIUM 40 MG PO TABS: 40.0000 mg | ORAL_TABLET | Freq: Every day | ORAL | 1 refills | Status: AC

## 2024-02-06 MED ORDER — PRAMIPEXOLE DIHYDROCHLORIDE 0.5 MG PO TABS: 0.5000 mg | ORAL_TABLET | Freq: Three times a day (TID) | ORAL | 1 refills | Status: AC

## 2024-02-06 MED ORDER — ESCITALOPRAM OXALATE 20 MG PO TABS: 20.0000 mg | ORAL_TABLET | Freq: Every day | ORAL | 1 refills | Status: AC

## 2024-02-06 MED ORDER — BUPROPION HCL ER (XL) 150 MG PO TB24: 150.0000 mg | ORAL_TABLET | Freq: Every day | ORAL | 1 refills | Status: AC

## 2024-02-06 MED ORDER — BUSPIRONE HCL 5 MG PO TABS: 5.0000 mg | ORAL_TABLET | Freq: Three times a day (TID) | ORAL | 1 refills | Status: AC

## 2024-02-06 MED ORDER — OMEPRAZOLE 40 MG PO CPDR: 40.0000 mg | DELAYED_RELEASE_CAPSULE | Freq: Every day | ORAL | 1 refills | Status: AC

## 2024-02-06 MED ORDER — FUROSEMIDE 40 MG PO TABS: 40.0000 mg | ORAL_TABLET | Freq: Every day | ORAL | 1 refills | Status: AC

## 2024-02-06 MED ORDER — FENOFIBRATE MICRONIZED 134 MG PO CAPS: ORAL_CAPSULE | ORAL | 1 refills | Status: AC

## 2024-02-06 NOTE — Progress Notes (Signed)
 Subjective:    Patient ID: Stacie Solis, female    DOB: Jul 05, 1960, 63 y.o.   MRN: [id number]  Chief Complaint: medical management of chronic issues     HPI:  Stacie Solis is a 63 y.o. who identifies as a female who was assigned female at birth.   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 Mixed hyperlipidemia Doe snot really watch diet and does no dedicated exercise. Lab Results  Component Value Date   CHOL 105 09/02/2023   HDL 33 (L) 09/02/2023   LDLCALC 51 09/02/2023   TRIG 117 09/02/2023   CHOLHDL 3.2 09/02/2023     2. Gastroesophageal reflux disease without esophagitis Is on achipex and does not work as well as other meds she has been on  3. Trigeminal neuralgia No complaints of pain today   4. Recurrent major depressive disorder, in full remission (HCC) Is on combination of wellbutrin  and lexapro . Says she is doing well right now.     02/06/2024   10:08 AM 12/27/2023    4:05 PM 09/02/2023   11:00 AM  Depression screen PHQ 2/9  Decreased Interest 1 0 1  Down, Depressed, Hopeless 1 0 1  PHQ - 2 Score 2 0 2  Altered sleeping 2 1 3   Tired, decreased energy 2 1 2   Change in appetite 0 1 1  Feeling bad or failure about yourself  1 0 0  Trouble concentrating 1 1 0  Moving slowly or fidgety/restless 1 0 0  Suicidal thoughts 0  0  PHQ-9 Score 9 4  8    Difficult doing work/chores Somewhat difficult Not difficult at all Somewhat difficult     Data saved with a previous flowsheet row definition      5. Panic attacks Denies any recent panic attacks. Sister passed away several months ago and she is still struggled.    02/06/2024   10:09 AM 12/27/2023    4:05 PM 09/02/2023   11:00 AM 03/08/2023    9:59 AM  GAD 7 : Generalized Anxiety Score  Nervous, Anxious, on Edge 3 1 0 1  Control/stop worrying 2 1 1 2   Worry too much - different things 2 0 1 2  Trouble relaxing  2 1 1 2   Restless 2 0 1 2  Easily annoyed or irritable 2 0 1 2  Afraid - awful might happen 1 0 0 2  Total GAD 7 Score 14 3 5 13   Anxiety Difficulty Somewhat difficult Not difficult at all Somewhat difficult Somewhat difficult     6. RLS (restless legs syndrome) Is on mirapex  and works well when she is sleeping  7. Chronic midline low back pain without sciatica Back pain is good tyoday  8. Peripheral edema Has daily- never completely resolves  9. Primary insomnia Is on ambien  to sleep, but has not been taking it.. Sleeps about  3-4 hours  10. Smoker Smokes over a pack a day.  11. CKD, stage III Lab Results  Component Value Date   CREATININE 1.29 (H) 09/02/2023    12. BMI 35.0-35.9,adult Weight is down 4lbs Wt Readings from Last 3 Encounters:  02/06/24 192 lb (87.1 kg)  12/27/23 196 lb (88.9 kg)  09/02/23 192 lb (87.1 kg)   BMI Readings from Last 3 Encounters:  02/06/24 35.12 kg/m  12/27/23 35.85 kg/m  09/02/23 35.12 kg/m      New complaints:  None today  Allergies  Allergen Reactions   Sulfa Antibiotics Nausea Only   Outpatient Encounter Medications as of 02/06/2024  Medication Sig   albuterol  (VENTOLIN  HFA) 108 (90 Base) MCG/ACT inhaler TAKE 2 PUFFS BY MOUTH EVERY 4 HOURS AS NEEDED   atorvastatin  (LIPITOR) 40 MG tablet Take 1 tablet (40 mg total) by mouth daily.   buPROPion  (WELLBUTRIN  XL) 150 MG 24 hr tablet Take 1 tablet (150 mg total) by mouth daily.   busPIRone  (BUSPAR ) 5 MG tablet Take 1 tablet (5 mg total) by mouth 3 (three) times daily.   cetirizine  (ZYRTEC ) 10 MG tablet TAKE 1 TABLET BY MOUTH EVERY DAY   Cholecalciferol (VITAMIN D ) 50 MCG (2000 UT) tablet Take 2,000 Units by mouth daily.   diclofenac  Sodium (VOLTAREN ) 1 % GEL APPLY 4 G TOPICALLY 4 TIMES DAILY   escitalopram  (LEXAPRO ) 20 MG tablet TAKE 1 TABLET BY MOUTH EVERY DAY   fenofibrate  micronized (LOFIBRA) 134 MG capsule TAKE 1 CAPSULE BY MOUTH EVERY DAY BEFORE BREAKFAST   fish  oil-omega-3 fatty acids 1000 MG capsule Take 1 g by mouth daily.   fluticasone  (FLONASE ) 50 MCG/ACT nasal spray Place 2 sprays into both nostrils daily.   furosemide  (LASIX ) 40 MG tablet Take 1 tablet (40 mg total) by mouth daily.   metaxalone (SKELAXIN) 800 MG tablet Take 800 mg by mouth 3 (three) times daily.   naloxone (NARCAN) nasal spray 4 mg/0.1 mL    omeprazole  (PRILOSEC) 40 MG capsule Take 1 capsule (40 mg total) by mouth daily.   PERCOCET 5-325 MG tablet Take 1 tablet by mouth 4 (four) times daily as needed for moderate pain.   pramipexole  (MIRAPEX ) 0.5 MG tablet Take 1 tablet (0.5 mg total) by mouth 3 (three) times daily.   triamcinolone  (NASACORT ) 55 MCG/ACT AERO nasal inhaler PLACE 2 SPRAYS INTO THE NOSE DAILY.   Vaginal Lubricant (REPLENS) GEL Place 1 Application vaginally daily as needed (moisture/irritation).   [DISCONTINUED] amoxicillin -clavulanate (AUGMENTIN ) 875-125 MG tablet Take 1 tablet by mouth 2 (two) times daily.   [DISCONTINUED] benzonatate  (TESSALON ) 100 MG capsule Take 1 capsule (100 mg total) by mouth 2 (two) times daily as needed for cough.   [DISCONTINUED] doxycycline  (VIBRA -TABS) 100 MG tablet Take 1 tablet (100 mg total) by mouth 2 (two) times daily. 1 po bid   [DISCONTINUED] predniSONE  (STERAPRED UNI-PAK 21 TAB) 10 MG (21) TBPK tablet As directed x 6 days   No facility-administered encounter medications on file as of 02/06/2024.    Past Surgical History:  Procedure Laterality Date   BRAIN SURGERY     trig neuralgia   BREAST BIOPSY Right 11/07/2020   fibroadenoma   CHOLECYSTECTOMY      Family History  Problem Relation Age of Onset   Hypertension Mother    Diabetes Mother    Arthritis Mother    Lung cancer Maternal Grandfather       Controlled substance contract: n/a     Review of Systems  Constitutional:  Negative for diaphoresis.  Eyes:  Negative for pain.  Respiratory:  Negative for shortness of breath.   Cardiovascular:  Negative for  chest pain, palpitations and leg swelling.  Gastrointestinal:  Negative for abdominal pain.  Endocrine: Negative for polydipsia.  Skin:  Negative for rash.  Neurological:  Negative for dizziness, weakness and headaches.  Hematological:  Does not bruise/bleed easily.  All other systems reviewed and are negative.      Objective:   Physical Exam Vitals and nursing note reviewed.  Constitutional:  General: She is not in acute distress.    Appearance: Normal appearance. She is well-developed.  HENT:     Head: Normocephalic.     Right Ear: Tympanic membrane normal.     Left Ear: Tympanic membrane normal.     Nose: Nose normal.     Mouth/Throat:     Mouth: Mucous membranes are moist.  Eyes:     Pupils: Pupils are equal, round, and reactive to light.  Neck:     Vascular: No carotid bruit or JVD.  Cardiovascular:     Rate and Rhythm: Normal rate and regular rhythm.     Heart sounds: Normal heart sounds.  Pulmonary:     Effort: Pulmonary effort is normal. No respiratory distress.     Breath sounds: Normal breath sounds. No wheezing or rales.  Chest:     Chest wall: No tenderness.  Abdominal:     General: Bowel sounds are normal. There is no distension or abdominal bruit.     Palpations: Abdomen is soft. There is no hepatomegaly, splenomegaly, mass or pulsatile mass.     Tenderness: There is no abdominal tenderness.  Musculoskeletal:        General: Normal range of motion.     Cervical back: Normal range of motion and neck supple.     Right lower leg: Edema (1+) present.     Left lower leg: Edema (1+) present.  Lymphadenopathy:     Cervical: No cervical adenopathy.  Skin:    General: Skin is warm and dry.     Comments: Circulatory change to bil lower ext- no infection  noted  Neurological:     Mental Status: She is alert and oriented to person, place, and time.     Deep Tendon Reflexes: Reflexes are normal and symmetric.  Psychiatric:        Behavior: Behavior normal.         Thought Content: Thought content normal.        Judgment: Judgment normal.     BP 119/74   Pulse (!) 106   Temp (!) 97.2 F (36.2 C) (Temporal)   Ht 5' 2 (1.575 m)   Wt 192 lb (87.1 kg)   SpO2 94%   BMI 35.12 kg/m         Assessment & Plan:   SYLVIE MIFSUD comes in today with chief complaint of medical management of chronic issues    Diagnosis and orders addressed:  1. Mixed hyperlipidemia Low fat diet. - atorvastatin  (LIPITOR) 40 MG tablet; Take 1 tablet (40 mg total) by mouth daily.  Dispense: 90 tablet; Refill: 1 - fenofibrate  micronized (LOFIBRA) 134 MG capsule; TAKE 1 CAPSULE BY MOUTH DAILY BEFORE BREAKFAST.  Dispense: 90 capsule; Refill: 1 - CBC with Differential/Platelet - CMP14+EGFR - Lipid panel  2. Gastroesophageal reflux disease without esophagitis Stop achipix and start back on omeprazole  daily Avoid spicy foods Do not eat 2 hours prior to bedtime  - omeprazole  (PRILOSEC) 40 MG capsule; Take 1 capsule (40 mg total) by mouth daily.  Dispense: 90 capsule; Refill: 1  3. Trigeminal neuralgia  4. Recurrent major depressive disorder, in full remission (HCC) Stress management - buPROPion  (WELLBUTRIN  XL) 150 MG 24 hr tablet; Take 1 tablet (150 mg total) by mouth daily.  Dispense: 90 tablet; Refill: 1 - escitalopram  (LEXAPRO ) 20 MG tablet; Take 1 tablet (20 mg total) by mouth daily.  Dispense: 90 tablet; Refill: 1  5. Panic attacks - busPIRone  (BUSPAR ) 5 MG tablet; Take 1  tablet (5 mg total) by mouth 3 (three) times daily.  Dispense: 90 tablet; Refill: 5  6. RLS (restless legs syndrome) Keeps legs warm at night - pramipexole  (MIRAPEX ) 0.5 MG tablet; Take 1 tablet (0.5 mg total) by mouth 3 (three) times daily.  Dispense: 270 tablet; Refill: 1  7. Chronic midline low back pain without sciatica Moist heat rest  8. Primary insomnia Bedtime routine Try 1/2 tablet night  9. Smoker Smoking cessation encouraged Ordered low dose CT scan  10. BMI  35.0-35.9,adult Discussed diet and exercise for person with BMI >25 Will recheck weight in 3-6 months   11. Nasal congestion Continue nasacort  nasal spray - cetirizine  (ZYRTEC ) 10 MG tablet; Take 1 tablet (10 mg total) by mouth daily.  Dispense: 30 tablet; Refill: 11  12. Peripheral edema Elevate legs when sitting Compression socks - furosemide  (LASIX ) 40 MG tablet; Take 1 tablet (40 mg total) by mouth BId.  Dispense: 180 tablet; Refill: 1  13. Chronic kidney disease Labs pending  Labs pending Health Maintenance reviewed Diet and exercise encouraged  Follow up plan: 6 months   Mary-Margaret Gladis, FNP

## 2024-02-06 NOTE — Patient Instructions (Signed)

## 2024-02-07 ENCOUNTER — Ambulatory Visit: Payer: Self-pay | Admitting: Nurse Practitioner

## 2024-03-06 ENCOUNTER — Ambulatory Visit: Payer: Self-pay | Admitting: Nurse Practitioner

## 2024-03-19 ENCOUNTER — Encounter: Admitting: Nurse Practitioner

## 2024-03-19 ENCOUNTER — Encounter: Payer: Self-pay | Admitting: Nurse Practitioner

## 2024-08-04 ENCOUNTER — Ambulatory Visit: Admitting: Nurse Practitioner
